# Patient Record
Sex: Male | Born: 1955
Health system: Southern US, Community
[De-identification: ages and names within clinical notes are randomized; demographics above are authoritative.]

## PROBLEM LIST (undated history)

## (undated) DIAGNOSIS — E669 Obesity, unspecified: Secondary | ICD-10-CM

## (undated) DIAGNOSIS — K5790 Diverticulosis of intestine, part unspecified, without perforation or abscess without bleeding: Secondary | ICD-10-CM

## (undated) DIAGNOSIS — K819 Cholecystitis, unspecified: Secondary | ICD-10-CM

## (undated) DIAGNOSIS — K219 Gastro-esophageal reflux disease without esophagitis: Secondary | ICD-10-CM

## (undated) DIAGNOSIS — E119 Type 2 diabetes mellitus without complications: Secondary | ICD-10-CM

## (undated) DIAGNOSIS — Z8489 Family history of other specified conditions: Secondary | ICD-10-CM

## (undated) DIAGNOSIS — R42 Dizziness and giddiness: Secondary | ICD-10-CM

## (undated) DIAGNOSIS — L570 Actinic keratosis: Secondary | ICD-10-CM

## (undated) DIAGNOSIS — N529 Male erectile dysfunction, unspecified: Secondary | ICD-10-CM

## (undated) DIAGNOSIS — J302 Other seasonal allergic rhinitis: Secondary | ICD-10-CM

## (undated) DIAGNOSIS — I10 Essential (primary) hypertension: Secondary | ICD-10-CM

## (undated) DIAGNOSIS — Z87442 Personal history of urinary calculi: Secondary | ICD-10-CM

## (undated) DIAGNOSIS — M1990 Unspecified osteoarthritis, unspecified site: Secondary | ICD-10-CM

## (undated) DIAGNOSIS — M791 Myalgia, unspecified site: Secondary | ICD-10-CM

## (undated) DIAGNOSIS — N486 Induration penis plastica: Secondary | ICD-10-CM

## (undated) DIAGNOSIS — E785 Hyperlipidemia, unspecified: Secondary | ICD-10-CM

## (undated) DIAGNOSIS — M722 Plantar fascial fibromatosis: Secondary | ICD-10-CM

## (undated) HISTORY — PX: LITHOTRIPSY: SUR834

## (undated) HISTORY — DX: Essential (primary) hypertension: I10

## (undated) HISTORY — DX: Gastro-esophageal reflux disease without esophagitis: K21.9

## (undated) HISTORY — DX: Hyperlipidemia, unspecified: E78.5

## (undated) HISTORY — DX: Obesity, unspecified: E66.9

## (undated) HISTORY — DX: Induration penis plastica: N48.6

## (undated) HISTORY — DX: Other seasonal allergic rhinitis: J30.2

## (undated) HISTORY — DX: Type 2 diabetes mellitus without complications: E11.9

## (undated) HISTORY — PX: HERNIA REPAIR: SHX51

## (undated) HISTORY — PX: ARTHROSCOPIC REPAIR ACL: SUR80

## (undated) HISTORY — DX: Cholecystitis, unspecified: K81.9

## (undated) HISTORY — DX: Plantar fascial fibromatosis: M72.2

## (undated) HISTORY — DX: Male erectile dysfunction, unspecified: N52.9

## (undated) HISTORY — PX: MENISCUS REPAIR: SHX5179

## (undated) HISTORY — DX: Myalgia, unspecified site: M79.10

## (undated) HISTORY — DX: Diverticulosis of intestine, part unspecified, without perforation or abscess without bleeding: K57.90

## (undated) HISTORY — DX: Actinic keratosis: L57.0

---

## 2004-10-29 ENCOUNTER — Ambulatory Visit: Payer: Self-pay | Admitting: Unknown Physician Specialty

## 2007-11-03 ENCOUNTER — Ambulatory Visit: Payer: Self-pay | Admitting: Family Medicine

## 2007-11-03 IMAGING — US ABDOMEN ULTRASOUND
1 series · 17 of 25 positions shown · non-contrast
Comparison: none

REASON FOR EXAM: abd pain eval gallbladder
COMMENTS:

[Series 1: abdomen ultrasound · 17 of 64 slices shown]
[im 1/64]
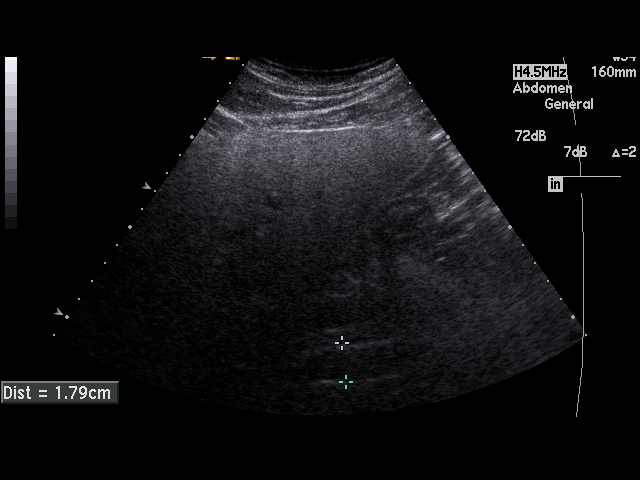
[im 6/64]
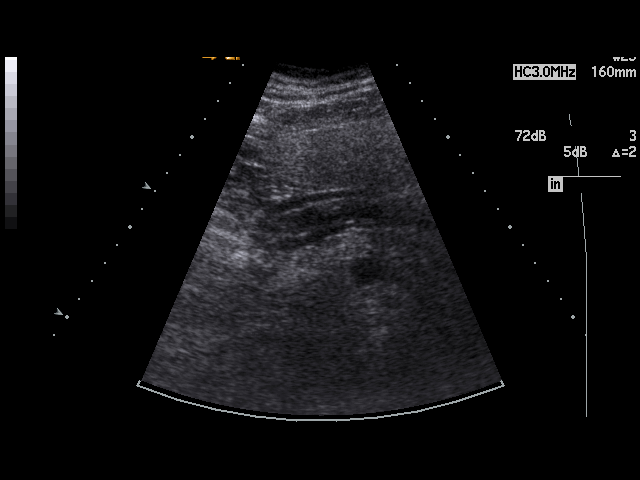
[im 8/64]
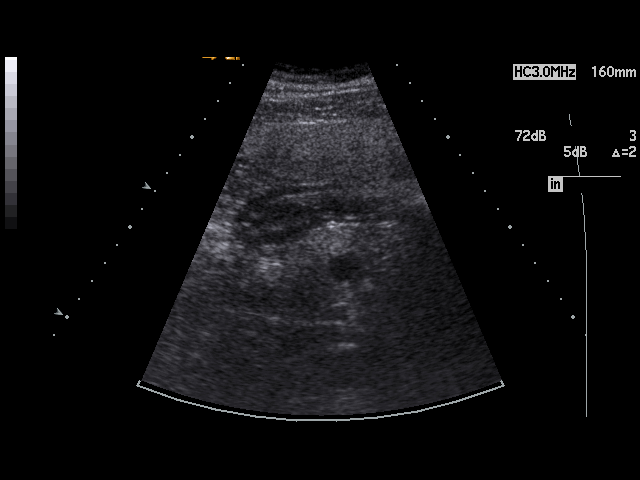
[im 14/64]
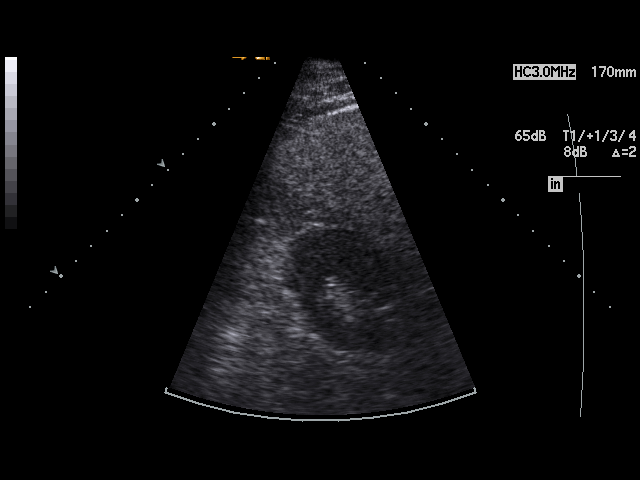
[im 16/64]
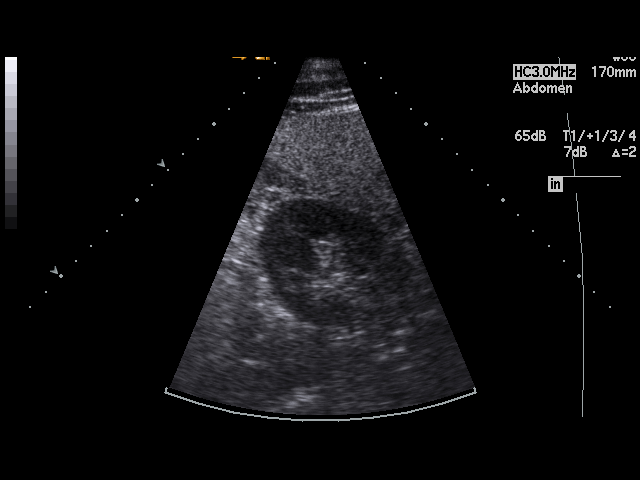
[im 22/64]
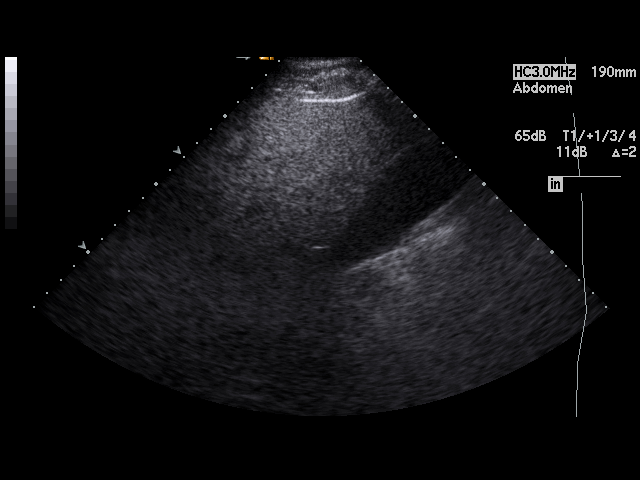
[im 24/64]
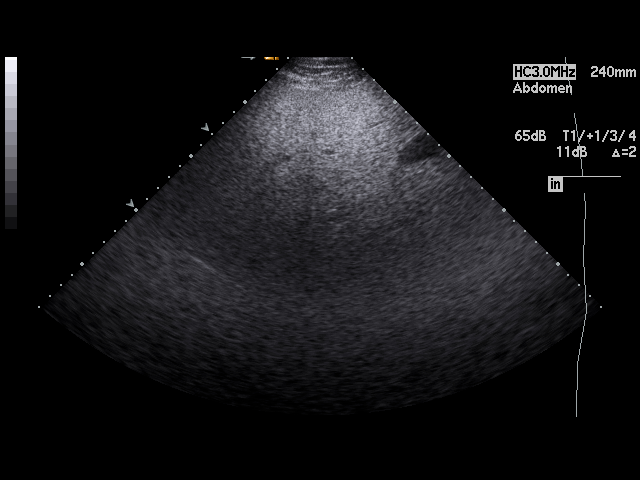
[im 29/64]
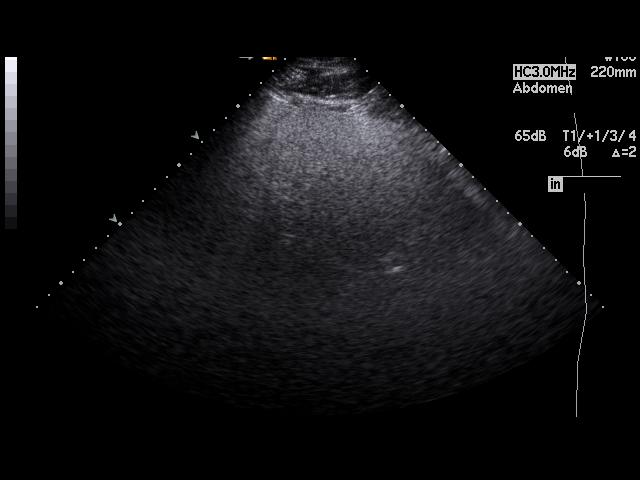
[im 32/64]
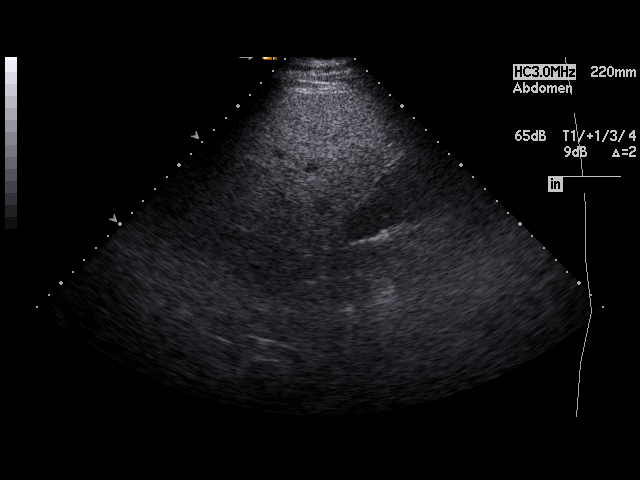
[im 35/64]
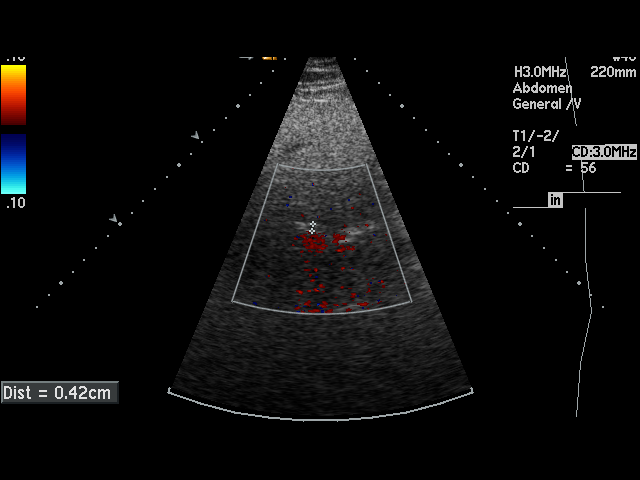
[im 40/64]
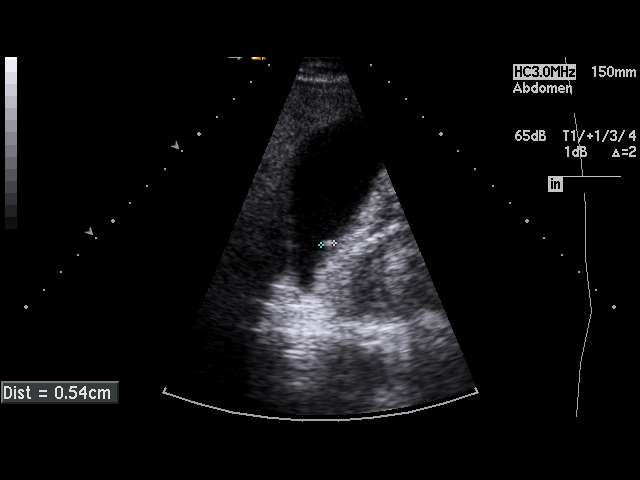
[im 43/64]
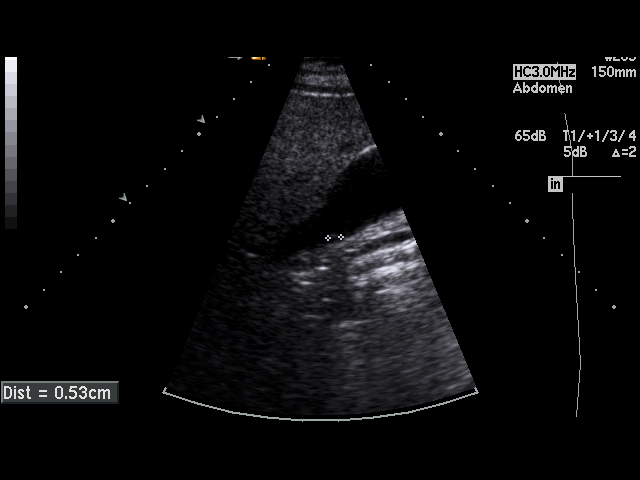
[im 48/64]
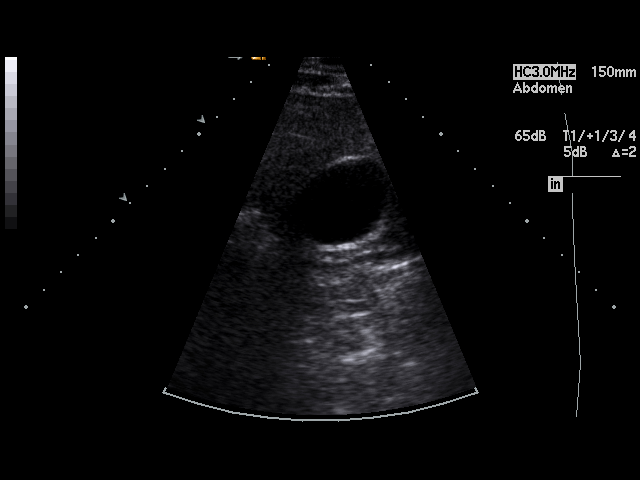
[im 50/64]
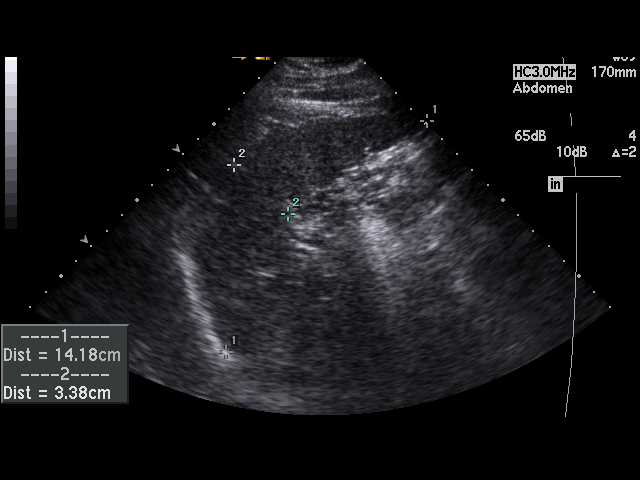
[im 56/64]
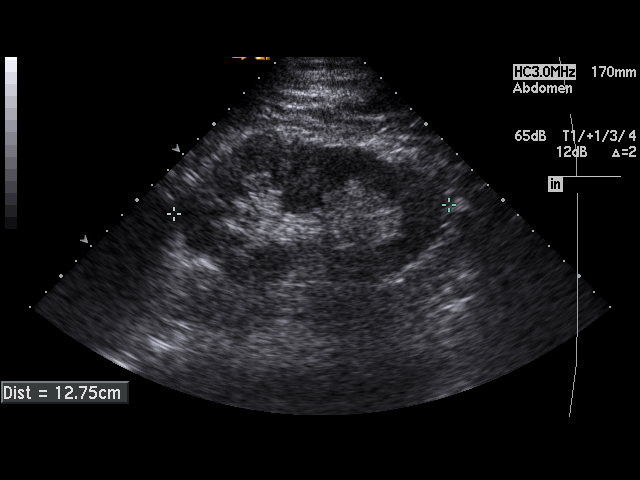
[im 58/64]
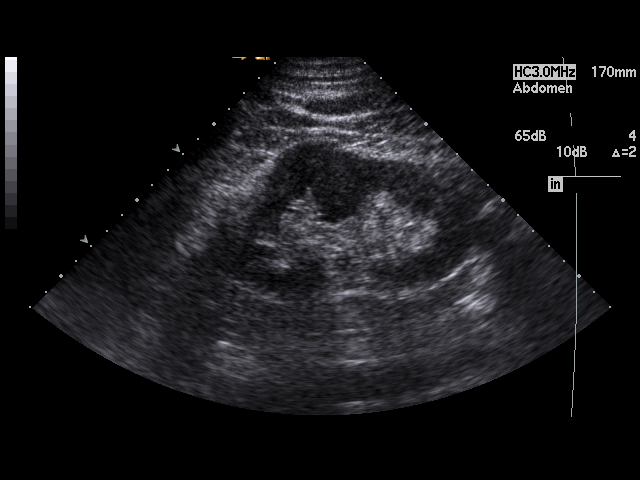
[im 64/64]
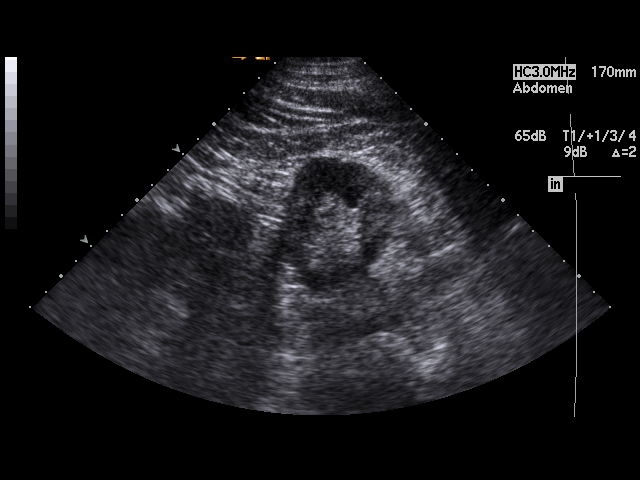

[17 of 25 positions shown; findings below may reference images not displayed]

PROCEDURE:     US  - US ABDOMEN GENERAL SURVEY  - [DATE]  [DATE]

RESULT:     The liver is dense, suspicious for fatty infiltration. The
spleen is mildly enlarged. The spleen measured 14.1 cm in AP diameter. The
abdominal aorta and inferior vena cava show no significant abnormalities.
The head and body of the pancreas are normal in appearance. The pancreatic
tail is obscured. There is a nonmobile nonshadowing 5.4 mm echodensity
associated with the posterior wall of the gallbladder and consistent with a
polyp. No definite gallstones are seen. There is no thickening of the
gallbladder wall. The common bile duct measures 4.2 mm in diameter which is
within normal limits. The kidneys show no hydronephrosis. There is no
ascites.
IMPRESSION: 1. Probable fatty infiltration of the liver.
2. There is a nonshadowing, nonmobile echodensity in the gallbladder
consistent with a polyp.
3. The spleen appears mildly enlarged.
4. A portion of the pancreatic tail is obscured.

## 2007-11-13 ENCOUNTER — Ambulatory Visit: Payer: Self-pay | Admitting: Surgery

## 2007-11-13 IMAGING — NM NUCLEAR MEDICINE HEPATOHBILIARY INCLUDE GB
3 series · 19 of 19 positions shown · non-contrast
Comparison: none

REASON FOR EXAM: polyps gallbladder    RUQ pain
COMMENTS:

[Series 1000: gallbladder statics · 4.80mm/px · 7 of 7 slices shown]
[im 1/7]
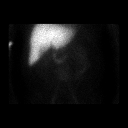
[im 2/7]
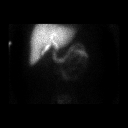
[im 3/7]
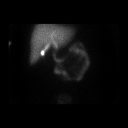
[im 4/7]
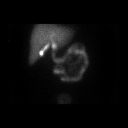
[im 5/7]
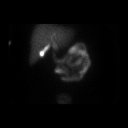
[im 6/7]
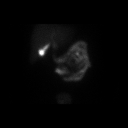
[im 7/7]
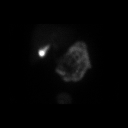

[Series 1000: gallbladder dynamic (results) · 4.80mm/px · 6 of 60 frames shown]
[frame 6/60]
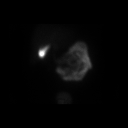
[frame 16/60]
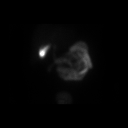
[frame 26/60]
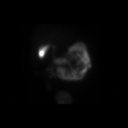
[frame 36/60]
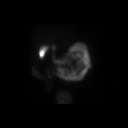
[frame 46/60]
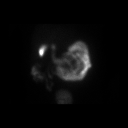
[frame 56/60]
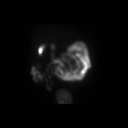

[Series 1000: gallbladder dynamic · 4.80mm/px · 6 of 60 frames shown]
[frame 6/60]
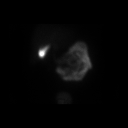
[frame 16/60]
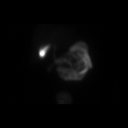
[frame 26/60]
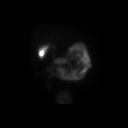
[frame 36/60]
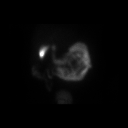
[frame 46/60]
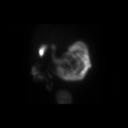
[frame 56/60]
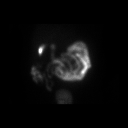

[19 of 19 positions shown; findings below may reference images not displayed]

PROCEDURE:     NM  - NM HEPATO WITH GB EJECT FRACTION  - [DATE] [DATE]

RESULT:     Following oral administration of 7.8 mCi technetium 99m
Choletec, there is observed prompt visualization of tracer activity in the
liver at 3 minutes. At 10 minutes tracer activity is visualized in the
gallbladder, common duct and proximal small bowel.

The gallbladder ejection fraction at 30 minutes measure 73%, which is in the
normal range.
IMPRESSION: 1. Normal hepatobiliary scan.
2. The gallbladder ejection fraction measures 73%.

## 2007-12-31 ENCOUNTER — Other Ambulatory Visit: Payer: Self-pay

## 2007-12-31 ENCOUNTER — Emergency Department: Payer: Self-pay | Admitting: Emergency Medicine

## 2010-10-05 ENCOUNTER — Ambulatory Visit: Payer: Self-pay | Admitting: Unknown Physician Specialty

## 2010-10-09 LAB — PATHOLOGY REPORT

## 2010-11-20 ENCOUNTER — Ambulatory Visit: Payer: Self-pay | Admitting: Family Medicine

## 2010-11-20 IMAGING — CR DG CHEST 2V
1 series · 2 of 2 positions shown · non-contrast
Comparison: none

REASON FOR EXAM: fever of unknow origin fatigue
COMMENTS:

PROCEDURE:     DXR - DXR CHEST PA (OR AP) AND LATERAL  - [DATE]  [DATE]
RESULT:     The lungs are clear. The cardiac silhouette and visualized bony
skeleton are unremarkable.

[Series 1: view not recorded · 0.17mm/px · 2 of 2 slices shown]
[im 1/2]
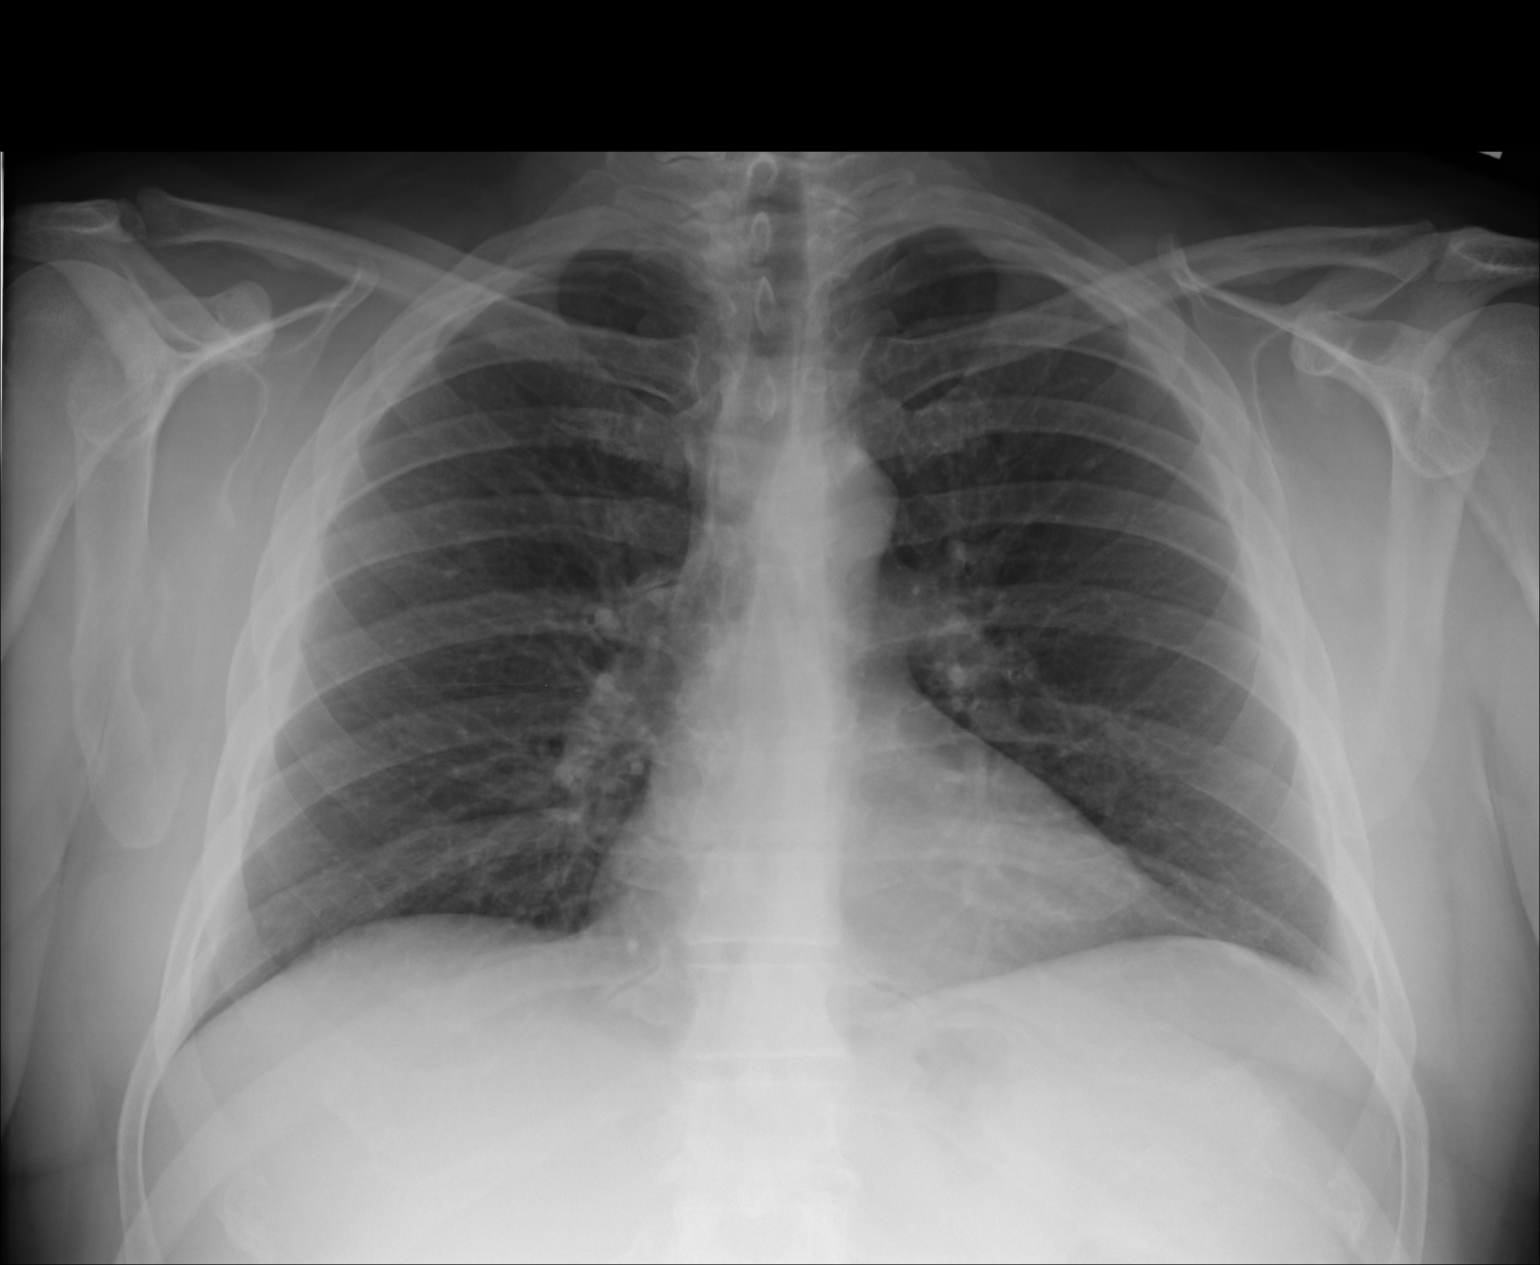
[im 2/2]
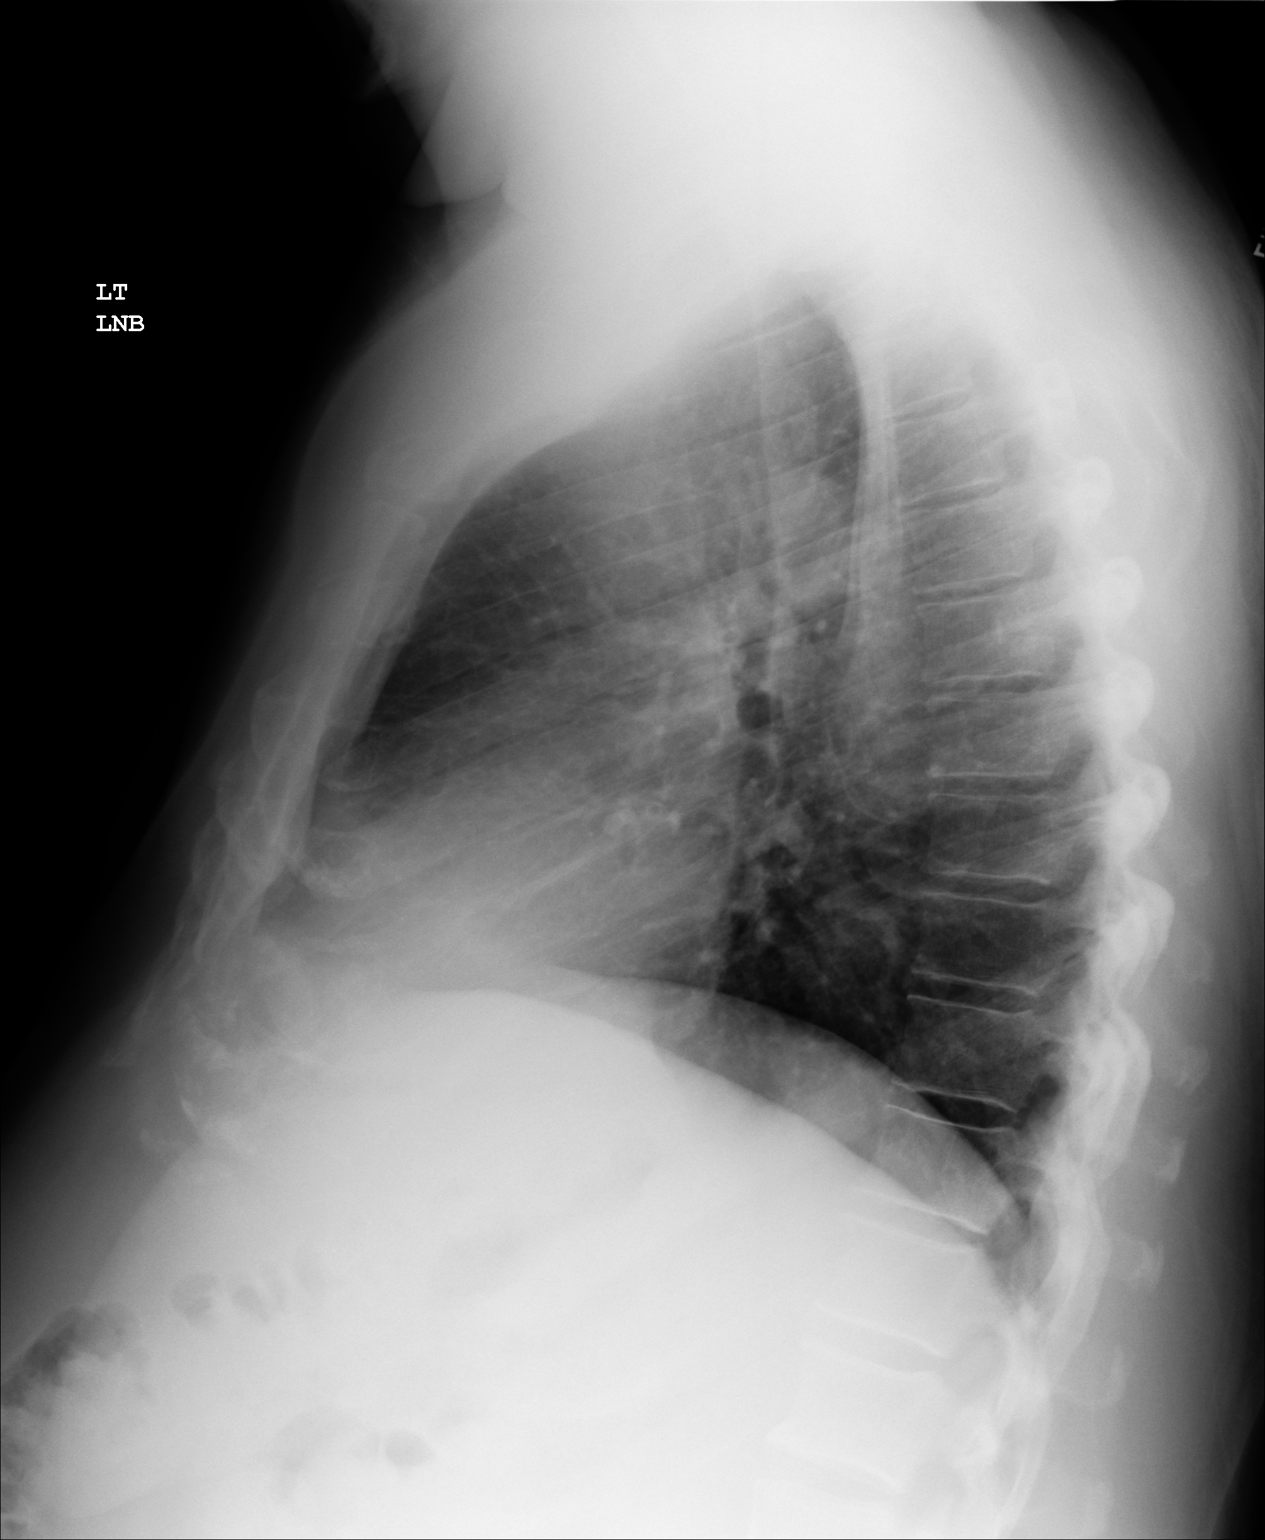

[2 of 2 positions shown; findings below may reference images not displayed]

IMPRESSION: 1. Chest radiograph without evidence of acute cardiopulmonary disease.

## 2012-11-16 ENCOUNTER — Ambulatory Visit: Payer: Self-pay | Admitting: Family Medicine

## 2012-11-16 IMAGING — CR DG CHEST 2V
1 series · 3 of 3 positions shown · non-contrast
Comparison: none

REASON FOR EXAM: CHEST PAIN
COMMENTS:

PROCEDURE:     KDR - KDXR CHEST PA (OR AP) AND LAT  - [DATE] [DATE]
RESULT:     Comparison: [DATE]

[Series 1: pa · 0.17mm/px · 3 of 3 slices shown]
[im 1/3]
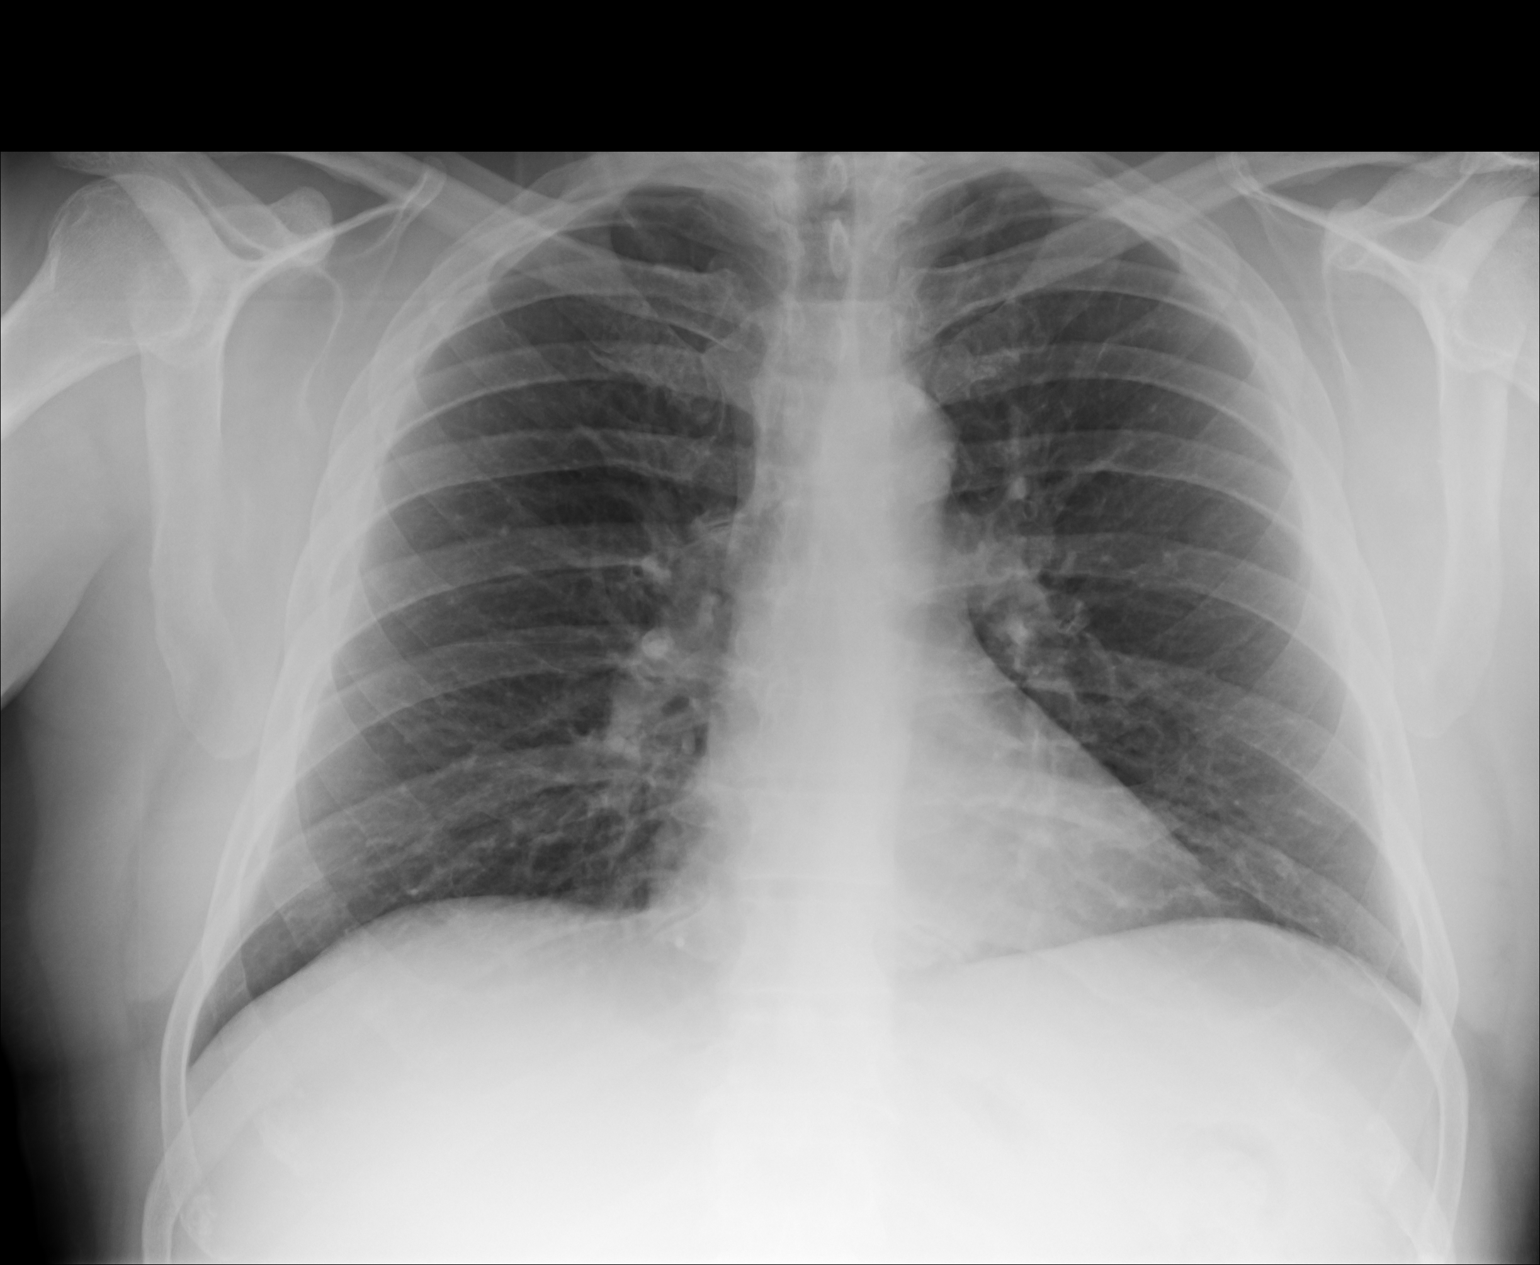
[im 2/3]
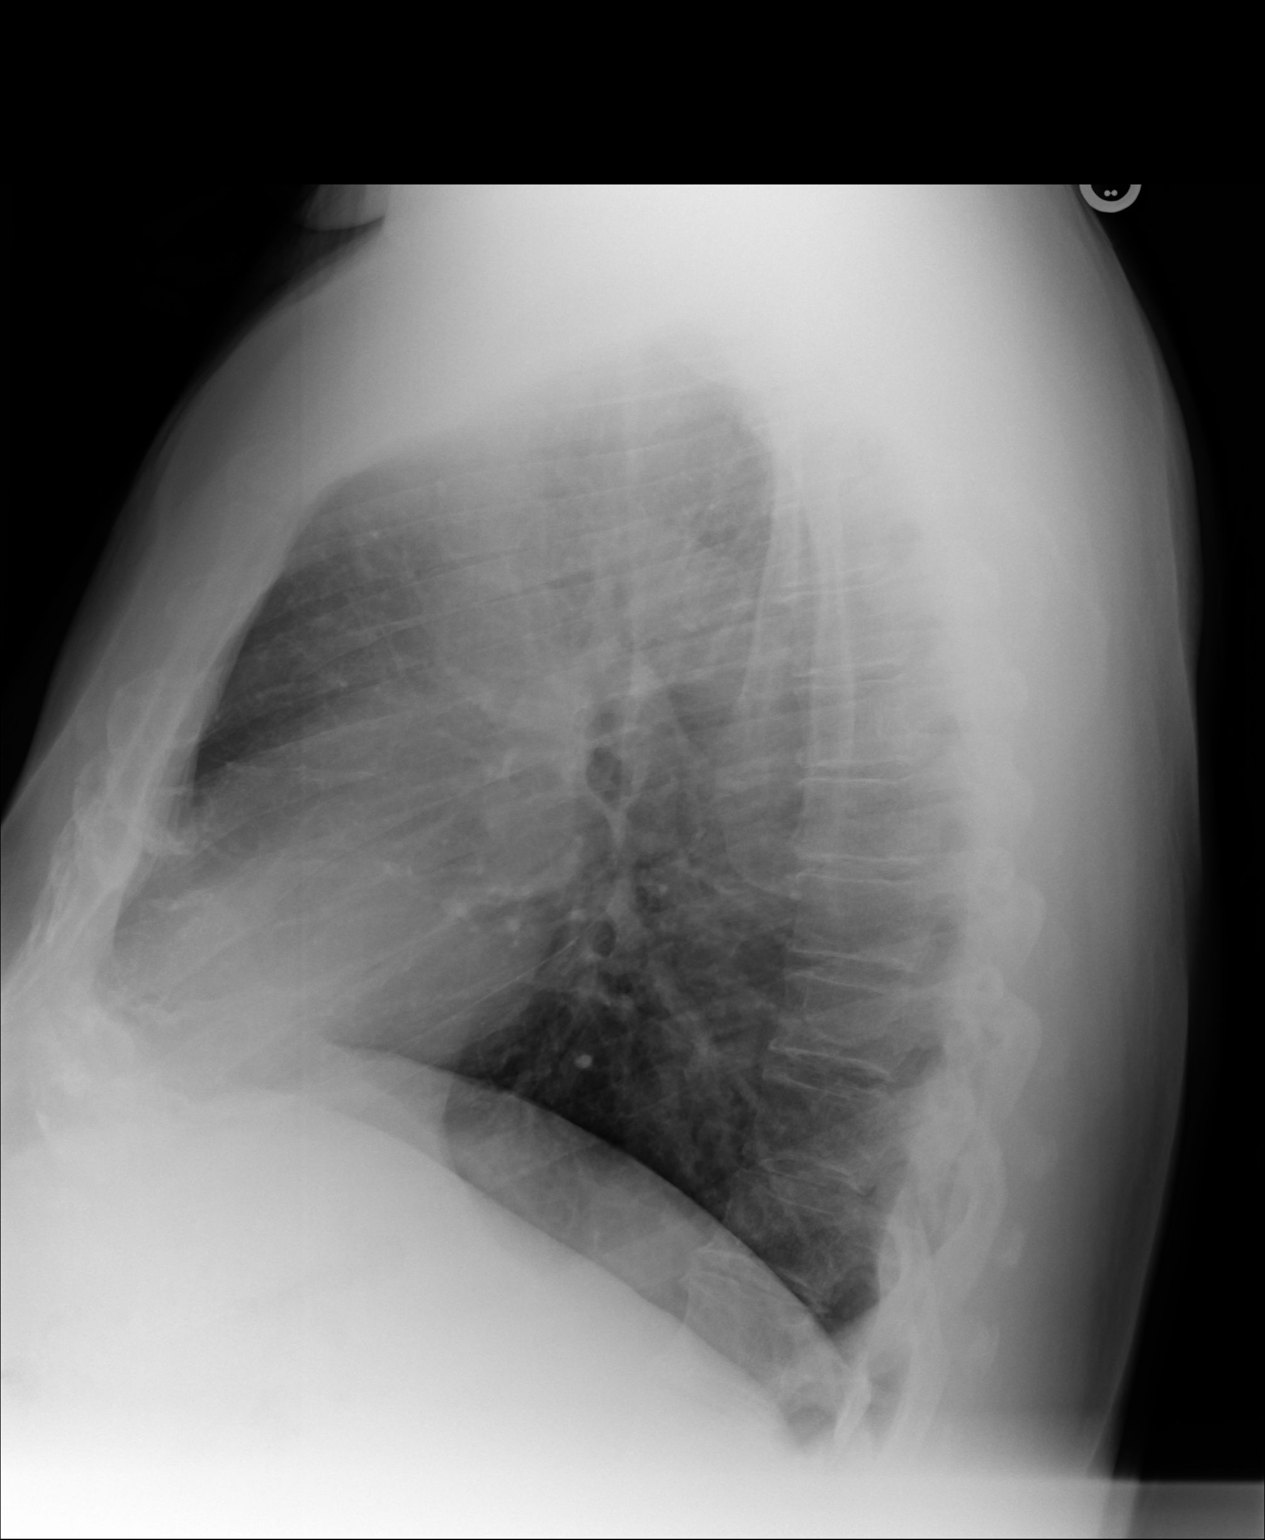
[im 3/3]
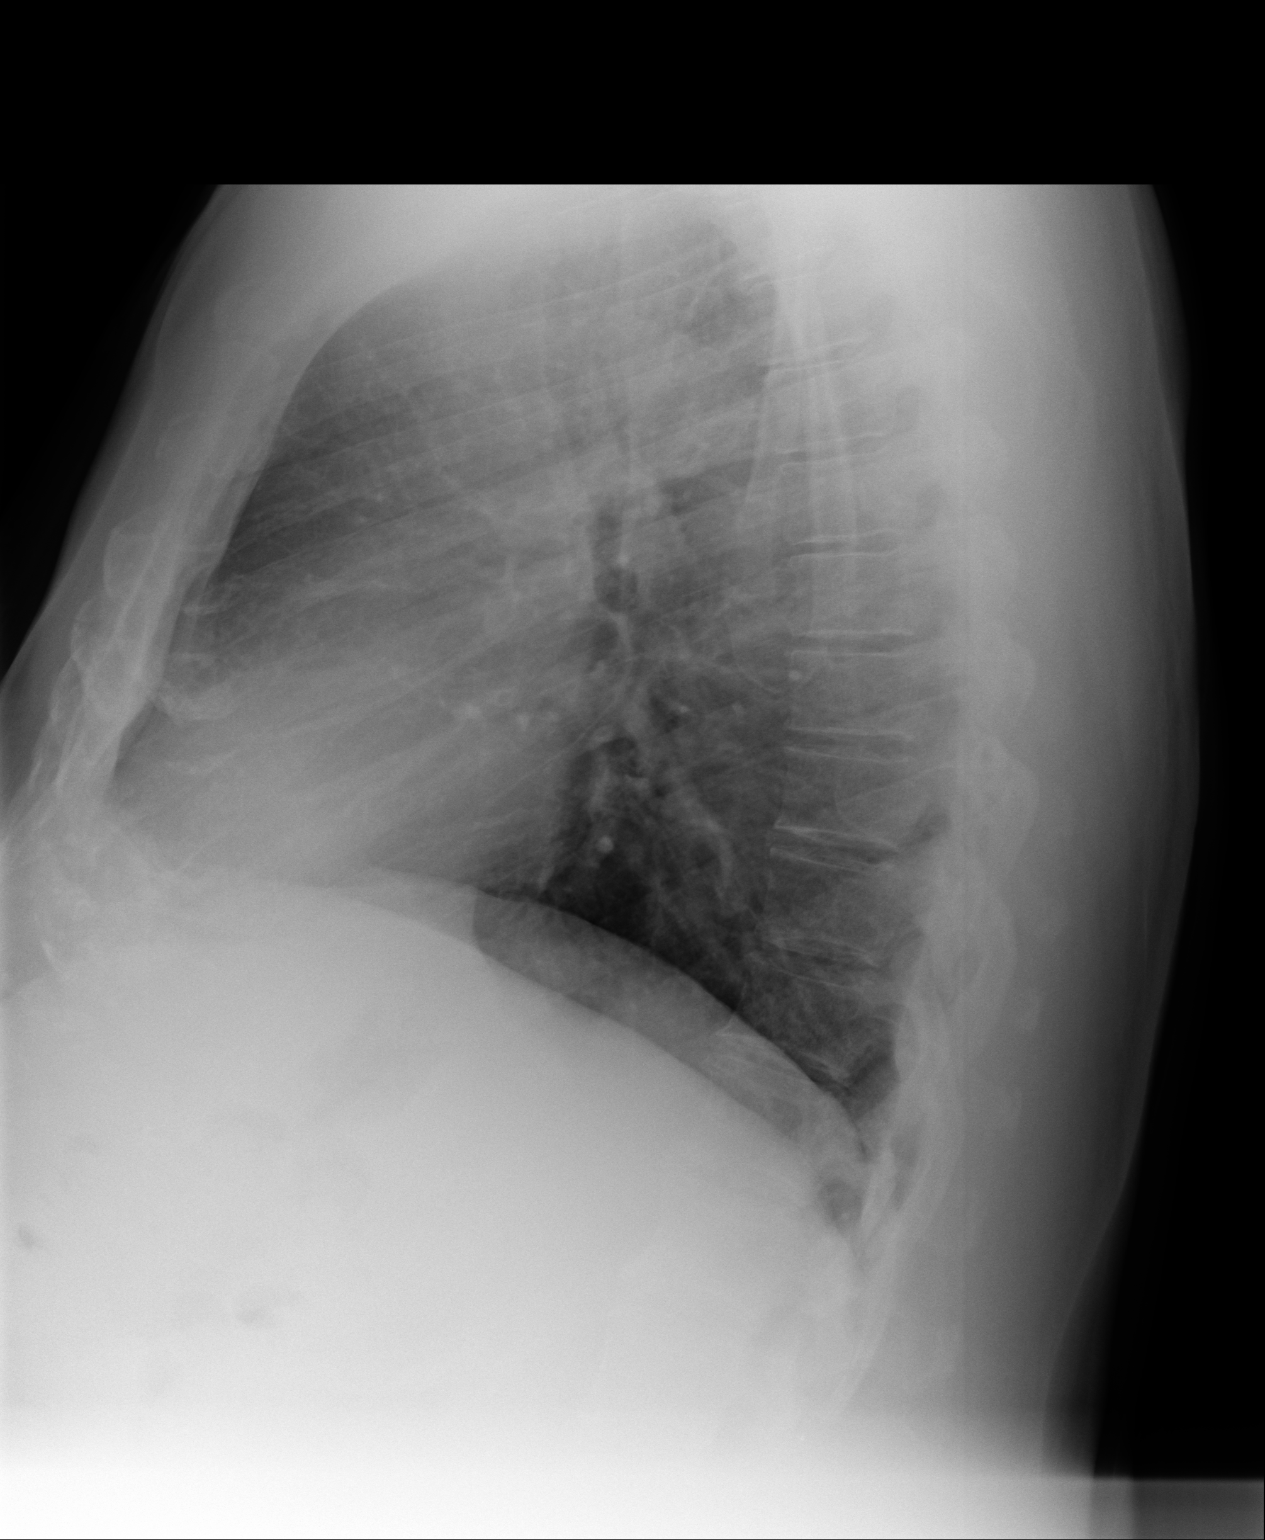

[3 of 3 positions shown; findings below may reference images not displayed]

FINDINGS: The heart and mediastinum are stable. The lung volumes are slightly
diminished. No focal pulmonary opacities.
IMPRESSION: No acute cardiopulmonary disease.

[REDACTED]

## 2013-10-28 ENCOUNTER — Ambulatory Visit: Payer: Self-pay | Admitting: Family Medicine

## 2013-10-28 IMAGING — CR RIGHT HIP - COMPLETE 2+ VIEW
1 series · 3 of 3 positions shown · non-contrast
Comparison: None.

CLINICAL DATA: Right hip pain.

EXAM:
RIGHT HIP - COMPLETE 2+ VIEW

[Series 1: t hip ap right · 0.14mm/px · 3 of 3 slices shown]
[im 1/3]
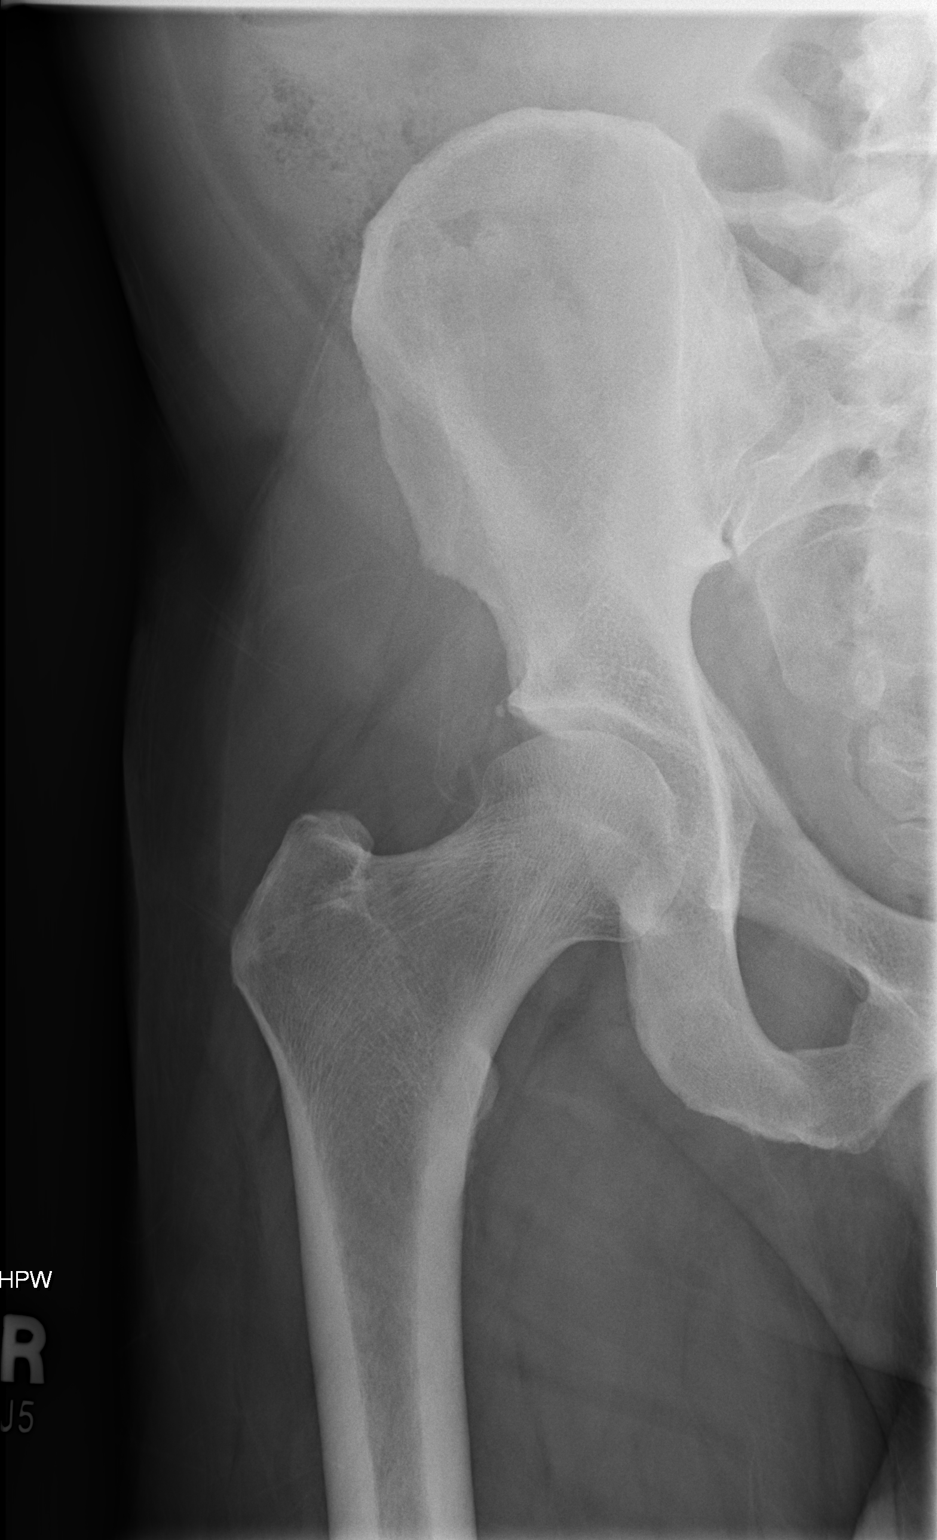
[im 2/3]
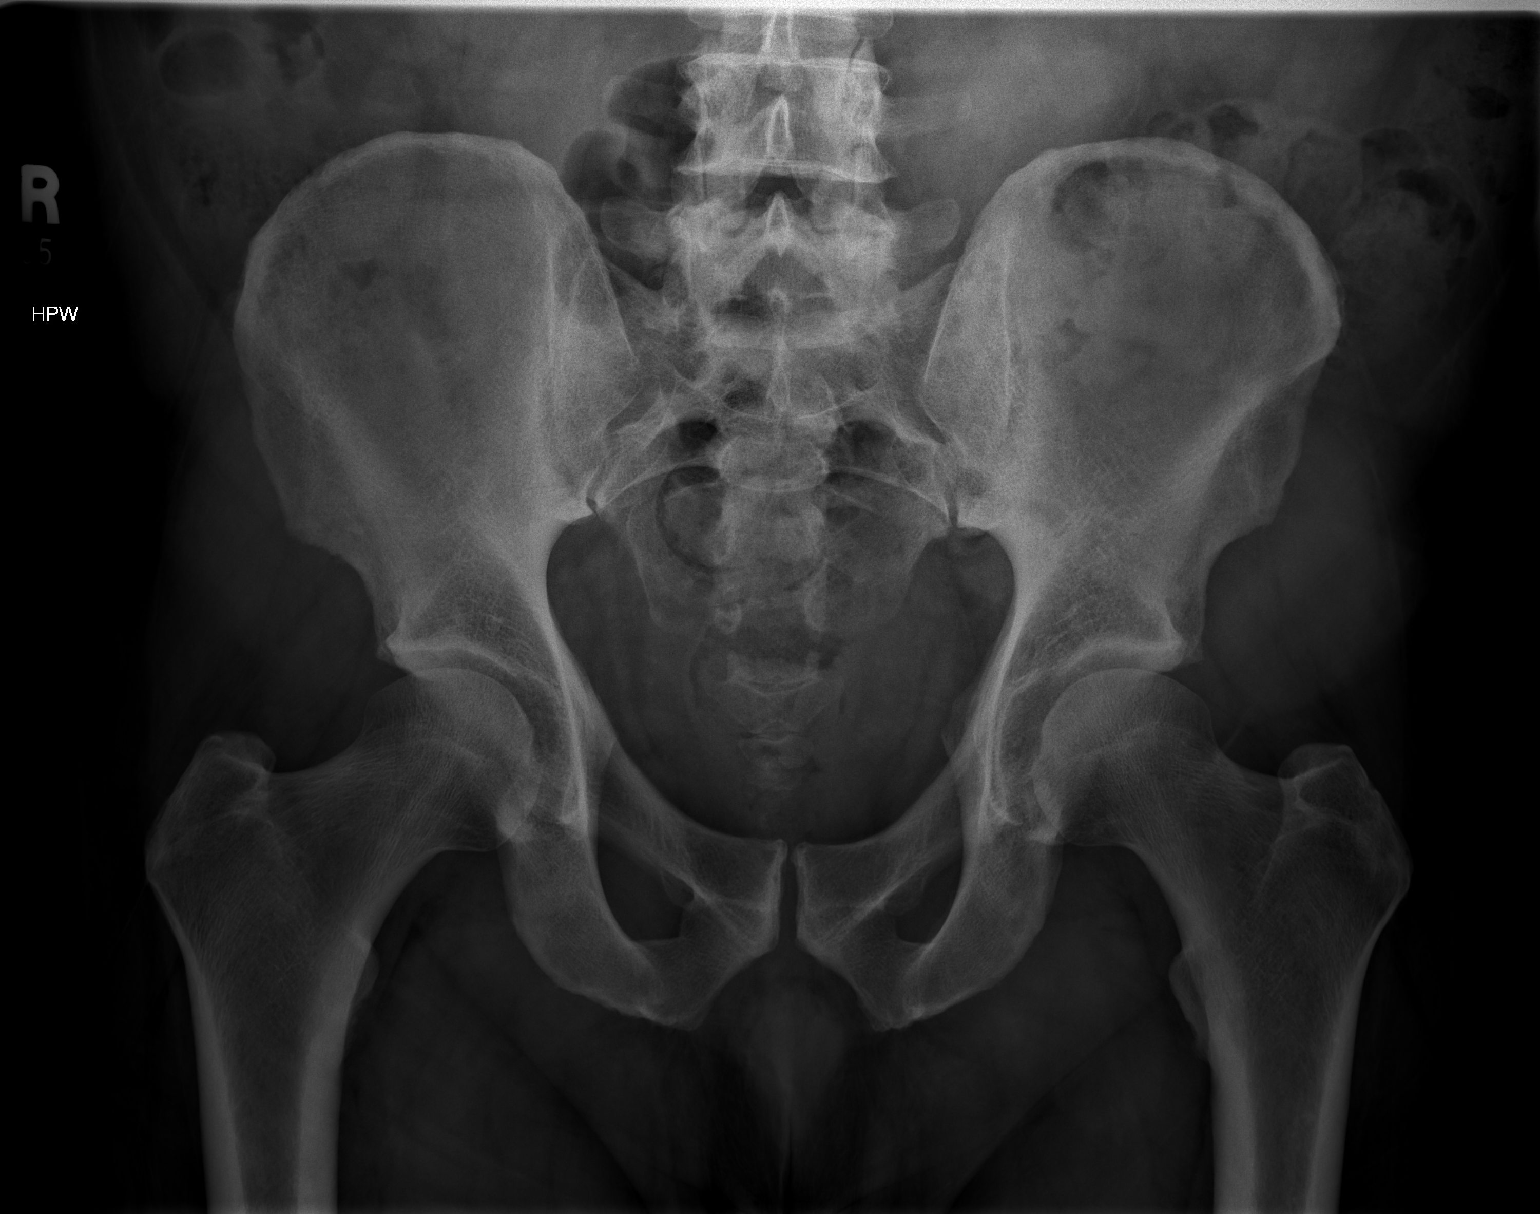
[im 3/3]
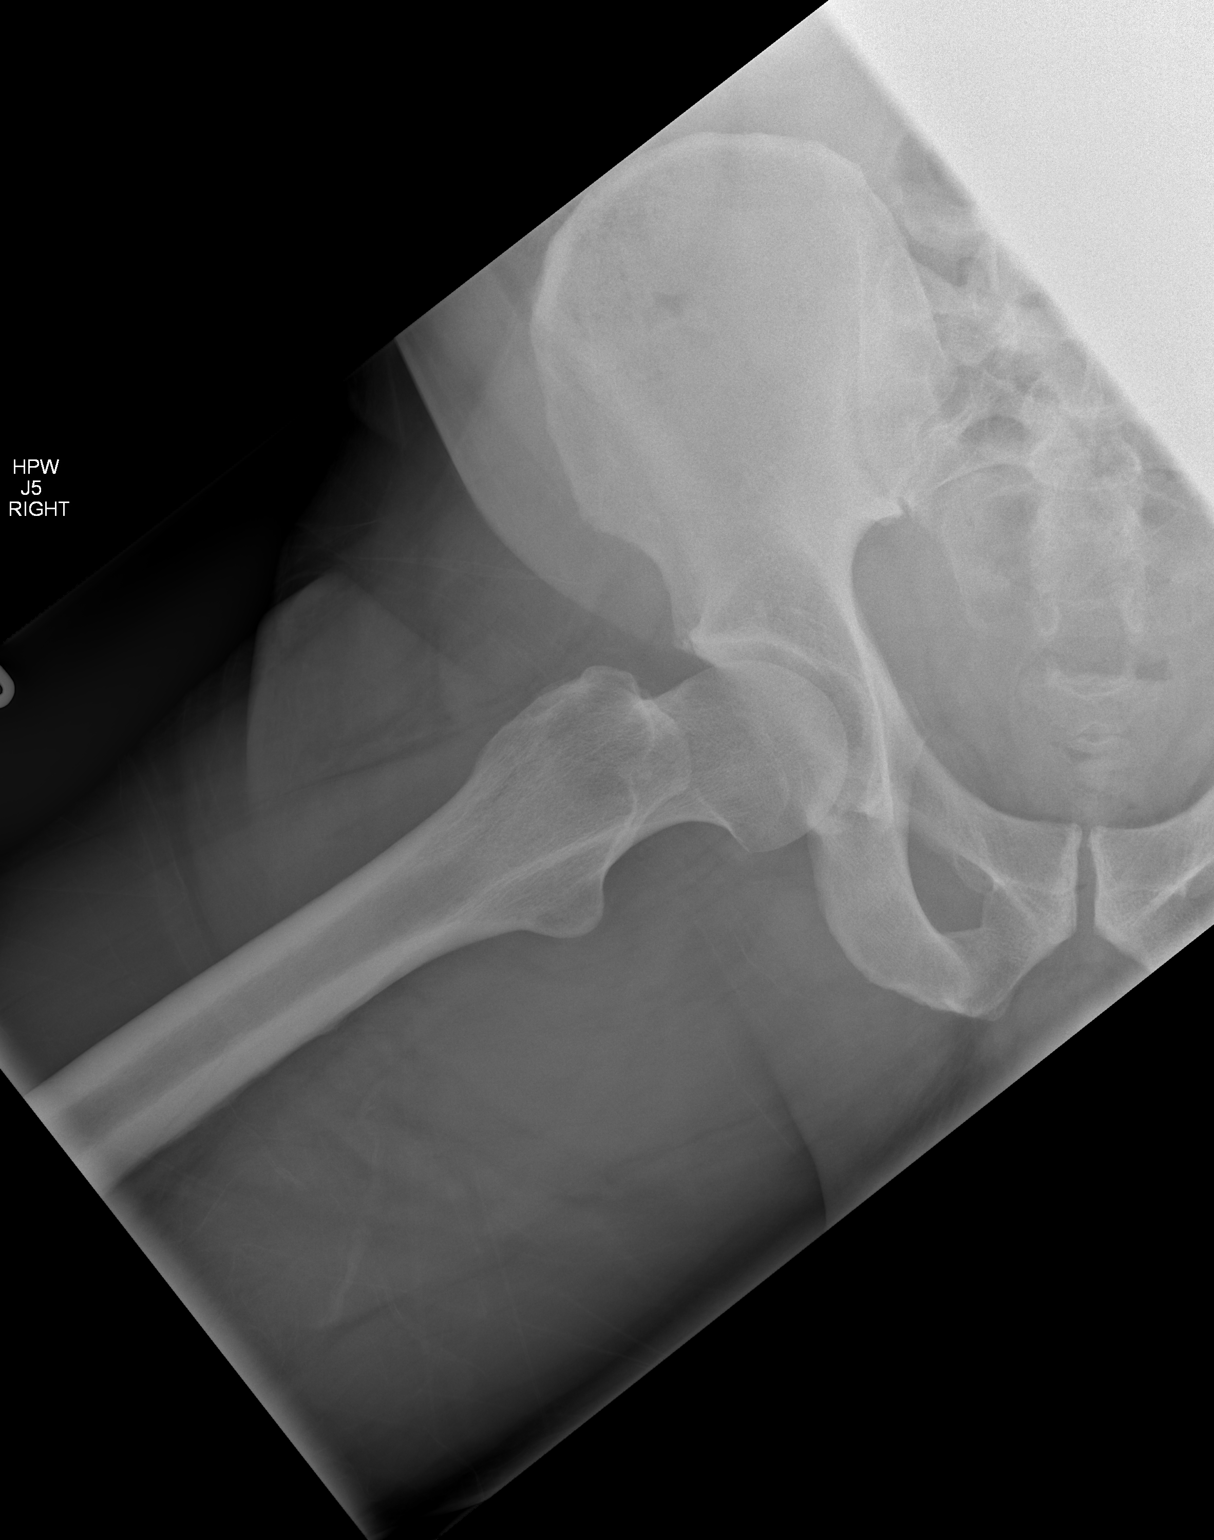

[3 of 3 positions shown; findings below may reference images not displayed]

FINDINGS: There is no evidence of hip fracture or dislocation. There is no
evidence of arthropathy or other focal bone abnormality.
IMPRESSION: Normal right hip.

## 2013-10-28 IMAGING — US US EXTREM LOW VENOUS*R*
1 series · 14 of 24 positions shown · non-contrast
Comparison: None.

CLINICAL DATA: Right lower extremity pain and edema

EXAM:
RIGHT LOWER EXTREMITY VENOUS DUPLEX ULTRASOUND
TECHNIQUE: Gray-scale sonography with graded compression, as well as color
Doppler and duplex ultrasound, were performed to evaluate the deep
venous system from the level of the common femoral vein through the
popliteal and proximal calf veins. Spectral Doppler was utilized to
evaluate flow at rest and with distal augmentation maneuvers.

[Series 1: us extrem low venous*right* · 0.11mm/px · 14 of 36 slices shown]
[im 1/36]
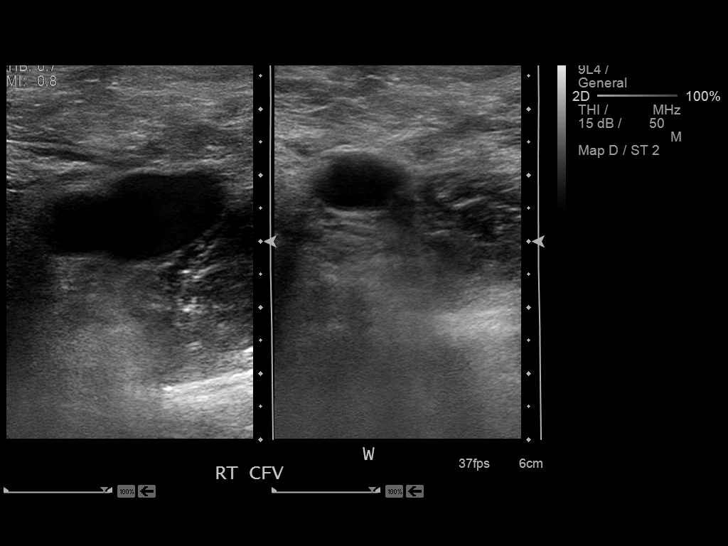
[im 4/36]
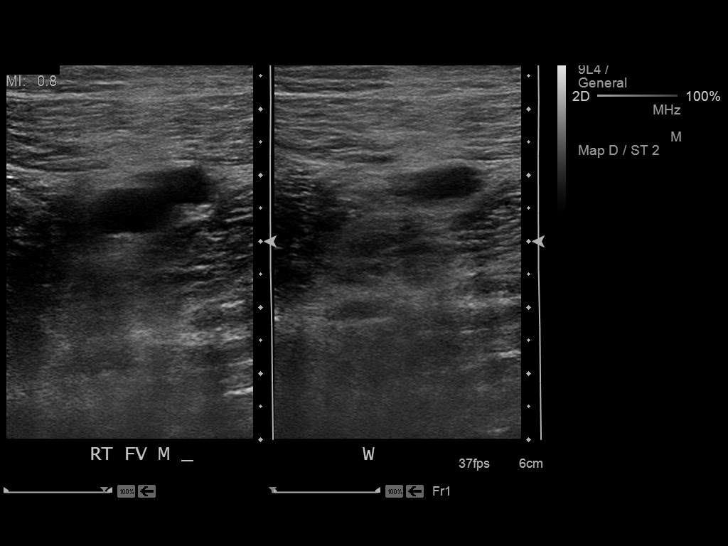
[im 7/36]
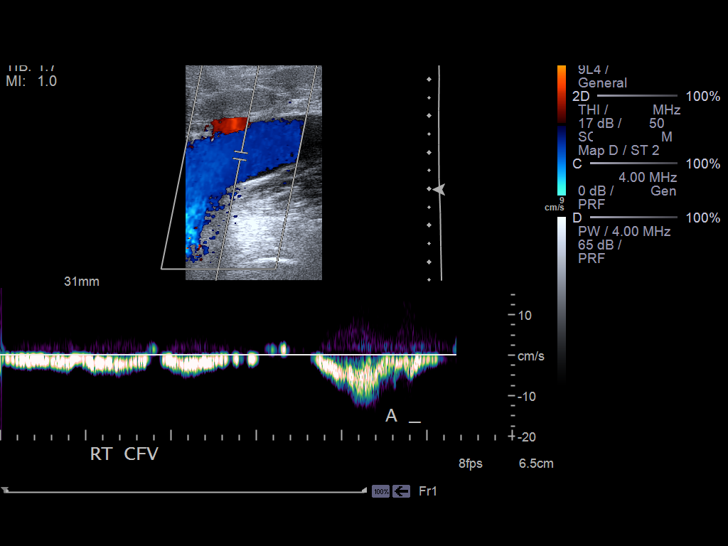
[im 10/36]
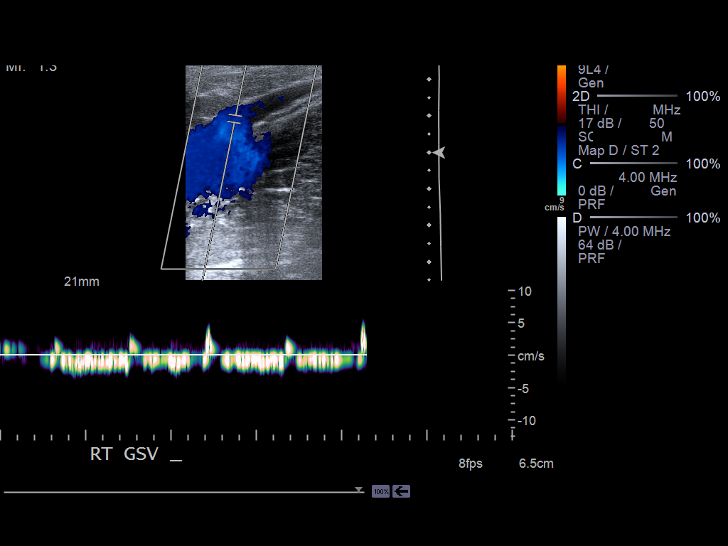
[im 11/36]
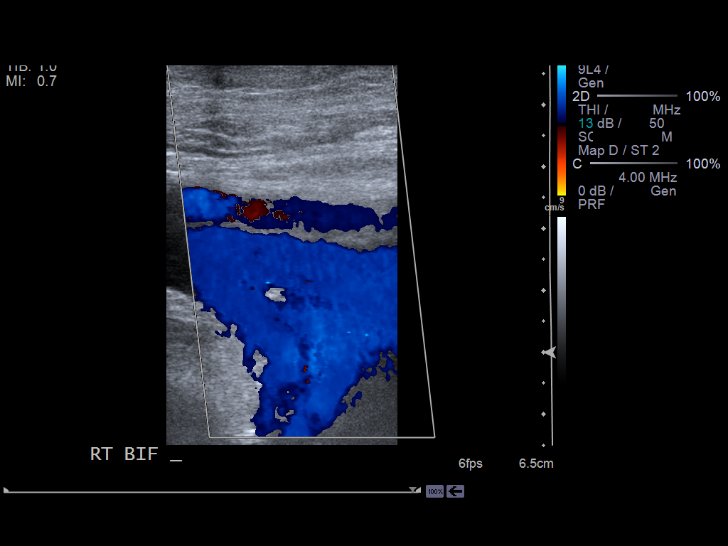
[im 14/36]
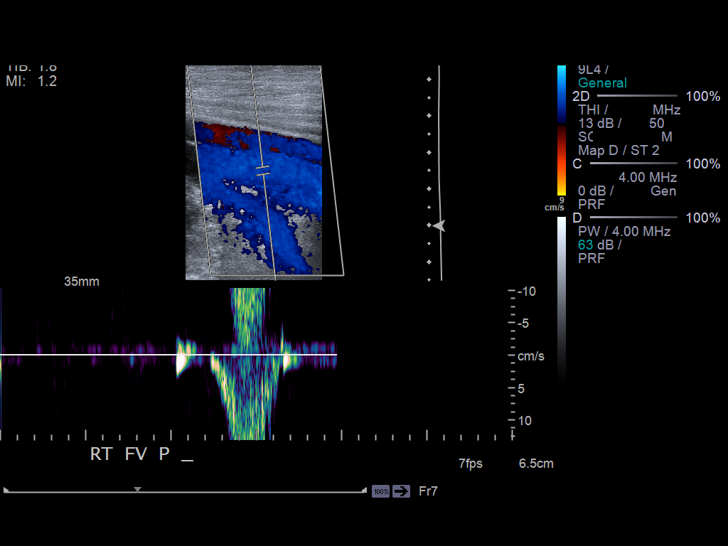
[im 17/36]
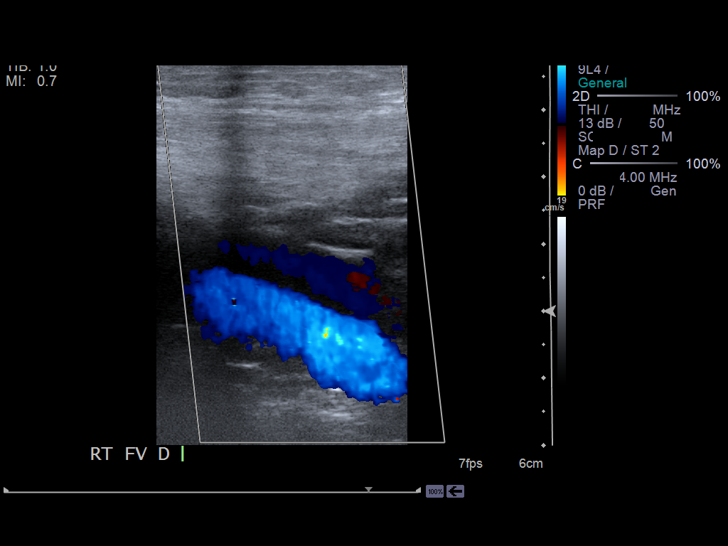
[im 19/36]
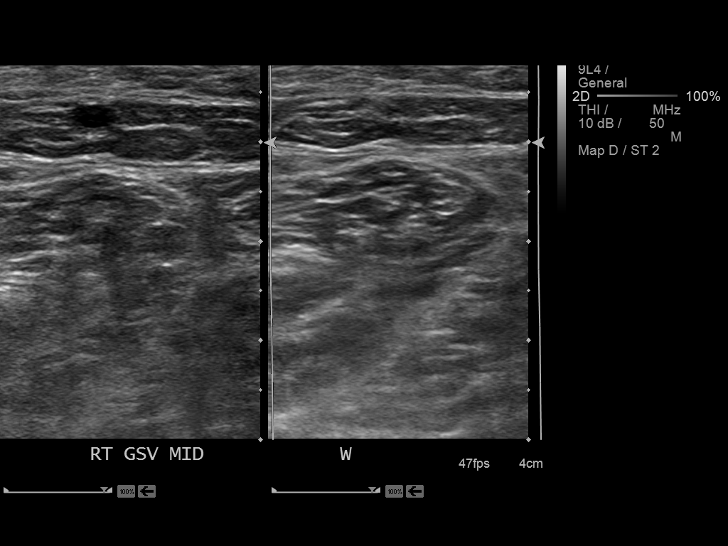
[im 22/36]
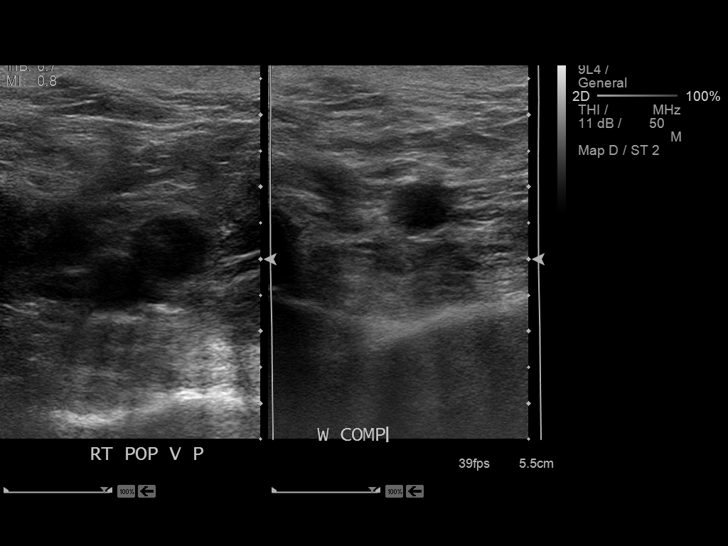
[im 25/36]
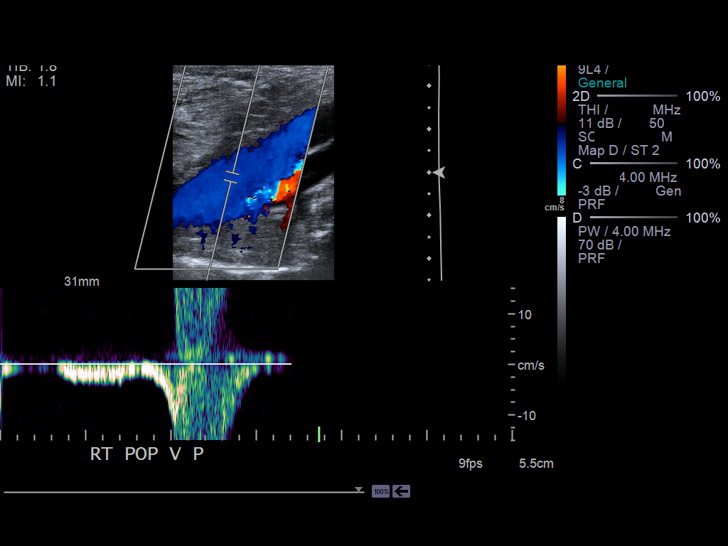
[im 28/36]
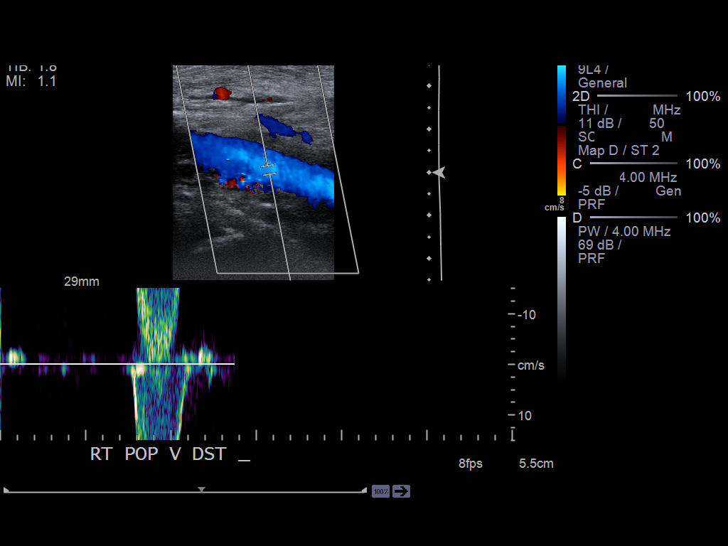
[im 29/36]
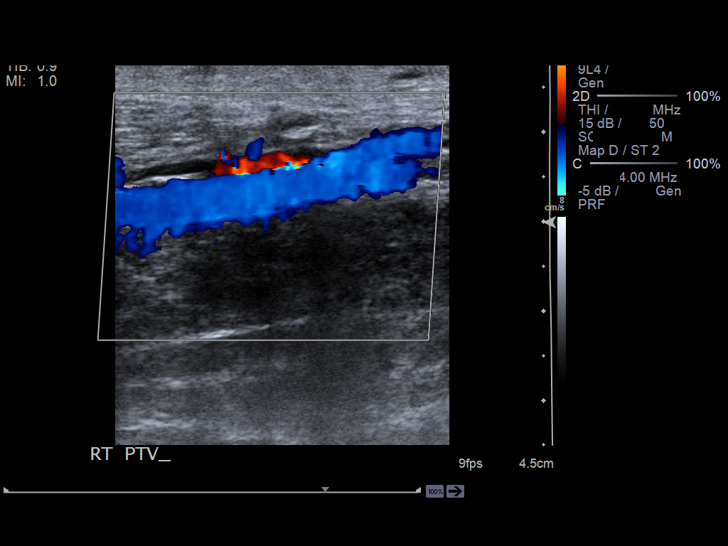
[im 32/36]
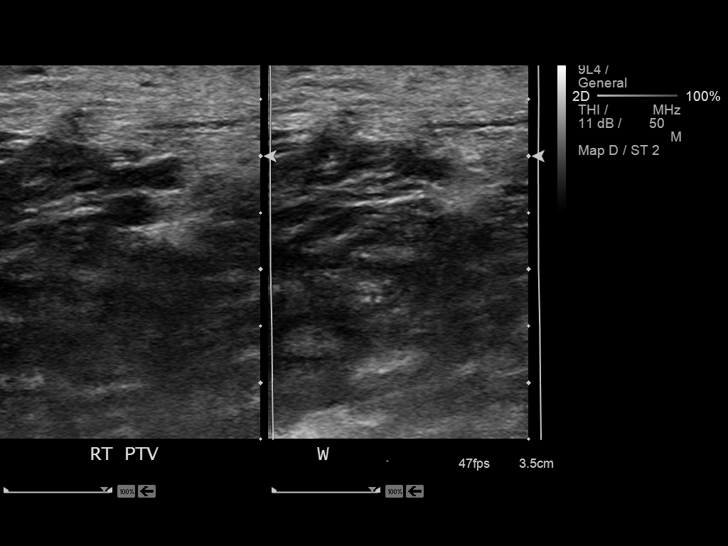
[im 36/36]
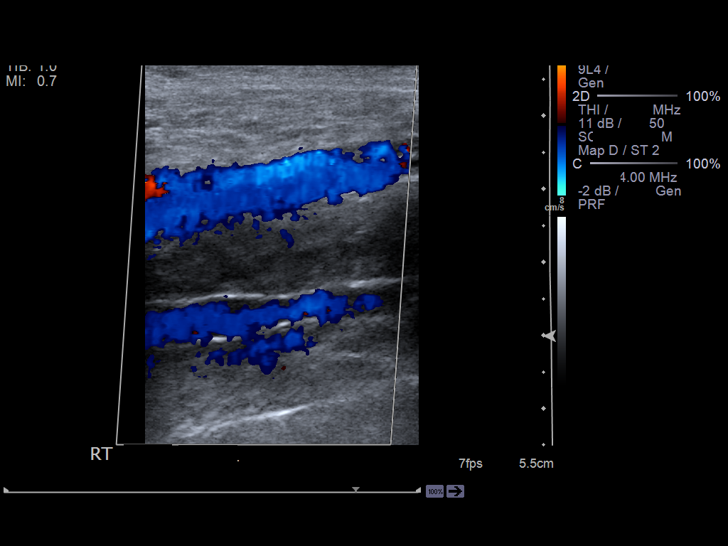

[14 of 24 positions shown; findings below may reference images not displayed]

FINDINGS: Flow in the venous structures of the right lower extremity is
spontaneous and phasic in all segments. There is normal compression
and augmentation in the venous structures of the right lower
extremity. Venous Doppler signal is normal in all regions. There is
no thrombus in the deep or visualized superficial venous structures
on the right. There is no right lower extremity deep venous
incompetence.
IMPRESSION: No evidence of right lower extremity deep venous thrombosis.

## 2014-04-22 LAB — HEMOGLOBIN A1C: Hgb A1c MFr Bld: 8.5 % — AB (ref 4.0–6.0)

## 2014-07-06 ENCOUNTER — Ambulatory Visit: Payer: Self-pay | Admitting: Family Medicine

## 2014-07-06 LAB — DOT URINE DIP
Blood: NEGATIVE
Glucose,UR: >=1000
Protein: NEGATIVE
Specific Gravity: 1.01 (ref 1.000–1.030)

## 2014-08-22 LAB — BASIC METABOLIC PANEL
BUN: 17 mg/dL (ref 4–21)
Creatinine: 1.1 mg/dL (ref 0.6–1.3)
Glucose: 121 mg/dL
Potassium: 4 mmol/L (ref 3.4–5.3)
Sodium: 136 mmol/L — AB (ref 137–147)

## 2014-08-22 LAB — CBC AND DIFFERENTIAL
HCT: 48 % (ref 41–53)
Hemoglobin: 16.6 g/dL (ref 13.5–17.5)
Neutrophils Absolute: 49 /uL
Platelets: 243 10*3/uL (ref 150–399)
WBC: 9.1 10^3/mL

## 2014-08-22 LAB — HEPATIC FUNCTION PANEL
ALT: 19 U/L (ref 10–40)
AST: 18 U/L (ref 14–40)
Alkaline Phosphatase: 58 U/L (ref 25–125)
Bilirubin, Total: 0.3 mg/dL

## 2014-08-22 LAB — PSA: PSA: 1.1

## 2014-08-22 LAB — FECAL OCCULT BLOOD, GUAIAC: Fecal Occult Blood: NEGATIVE

## 2014-08-22 LAB — LIPID PANEL
Cholesterol: 240 mg/dL — AB (ref 0–200)
HDL: 24 mg/dL — AB (ref 35–70)
LDL Cholesterol: 0 mg/dL
LDl/HDL Ratio: 0
Triglycerides: 847 mg/dL — AB (ref 40–160)

## 2014-08-22 LAB — HM COLONOSCOPY: HM Colonoscopy: 2242012

## 2014-11-30 ENCOUNTER — Ambulatory Visit
Admit: 2014-11-30 | Disposition: A | Discharge status: Home / Self Care | Payer: Self-pay | Attending: Family Medicine | Admitting: Family Medicine
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
## 2014-12-06 DIAGNOSIS — E785 Hyperlipidemia, unspecified: Secondary | ICD-10-CM

## 2014-12-06 DIAGNOSIS — J302 Other seasonal allergic rhinitis: Secondary | ICD-10-CM

## 2014-12-06 DIAGNOSIS — E782 Mixed hyperlipidemia: Secondary | ICD-10-CM | POA: Insufficient documentation

## 2014-12-06 DIAGNOSIS — E1159 Type 2 diabetes mellitus with other circulatory complications: Secondary | ICD-10-CM | POA: Insufficient documentation

## 2014-12-06 DIAGNOSIS — N486 Induration penis plastica: Secondary | ICD-10-CM

## 2014-12-06 DIAGNOSIS — E119 Type 2 diabetes mellitus without complications: Secondary | ICD-10-CM | POA: Insufficient documentation

## 2014-12-06 DIAGNOSIS — K219 Gastro-esophageal reflux disease without esophagitis: Secondary | ICD-10-CM | POA: Insufficient documentation

## 2014-12-06 DIAGNOSIS — E669 Obesity, unspecified: Secondary | ICD-10-CM

## 2014-12-06 DIAGNOSIS — E1165 Type 2 diabetes mellitus with hyperglycemia: Secondary | ICD-10-CM | POA: Insufficient documentation

## 2014-12-06 DIAGNOSIS — K819 Cholecystitis, unspecified: Secondary | ICD-10-CM | POA: Insufficient documentation

## 2014-12-06 DIAGNOSIS — M791 Myalgia, unspecified site: Secondary | ICD-10-CM | POA: Insufficient documentation

## 2014-12-06 DIAGNOSIS — K5790 Diverticulosis of intestine, part unspecified, without perforation or abscess without bleeding: Secondary | ICD-10-CM | POA: Insufficient documentation

## 2014-12-06 DIAGNOSIS — I10 Essential (primary) hypertension: Secondary | ICD-10-CM | POA: Insufficient documentation

## 2014-12-06 DIAGNOSIS — N529 Male erectile dysfunction, unspecified: Secondary | ICD-10-CM | POA: Insufficient documentation

## 2014-12-06 DIAGNOSIS — M722 Plantar fascial fibromatosis: Secondary | ICD-10-CM

## 2014-12-06 DIAGNOSIS — L570 Actinic keratosis: Secondary | ICD-10-CM

## 2015-01-05 ENCOUNTER — Other Ambulatory Visit: Payer: Self-pay | Admitting: Family Medicine

## 2015-01-05 ENCOUNTER — Ambulatory Visit
Admission: RE | Admit: 2015-01-05 | Discharge: 2015-01-05 | Disposition: A | Discharge status: Home / Self Care | Payer: 59 | Source: Ambulatory Visit | Attending: Family Medicine | Admitting: Family Medicine

## 2015-01-05 DIAGNOSIS — M47892 Other spondylosis, cervical region: Secondary | ICD-10-CM | POA: Diagnosis not present

## 2015-01-05 DIAGNOSIS — M5410 Radiculopathy, site unspecified: Secondary | ICD-10-CM | POA: Diagnosis present

## 2015-01-05 DIAGNOSIS — R52 Pain, unspecified: Secondary | ICD-10-CM

## 2015-01-05 IMAGING — CR DG CERVICAL SPINE COMPLETE 4+V
1 series · 7 of 7 positions shown · non-contrast
Comparison: [DATE]

CLINICAL DATA: Acute right shoulder and scapula pain.

EXAM:
CERVICAL SPINE  4+ VIEWS

[Series 1: dg cervical spine complete · 0.14mm/px · 7 of 7 slices shown]
[im 1/7]
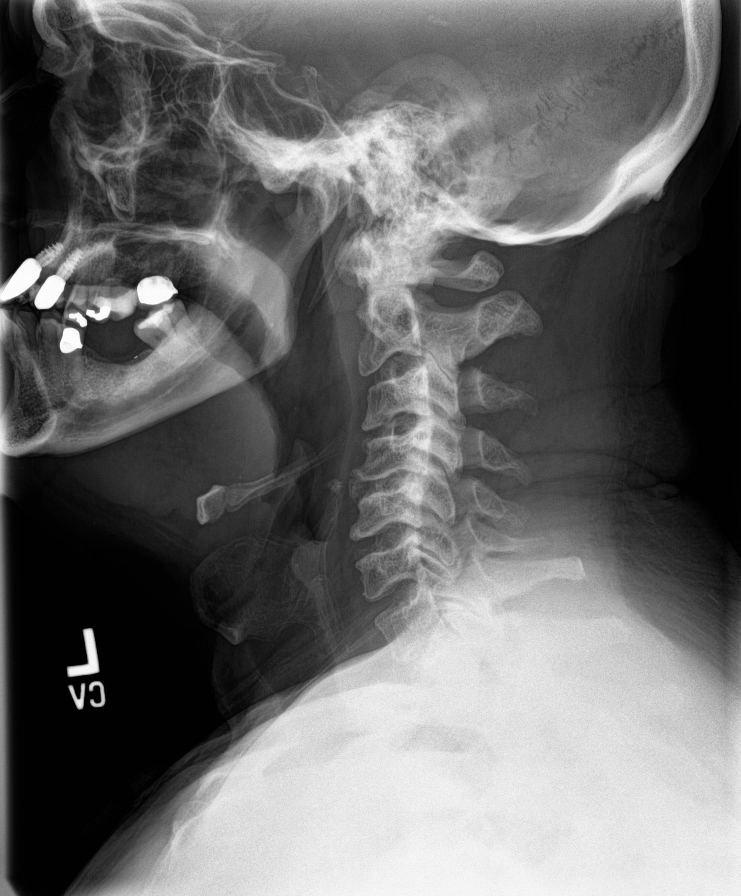
[im 2/7]
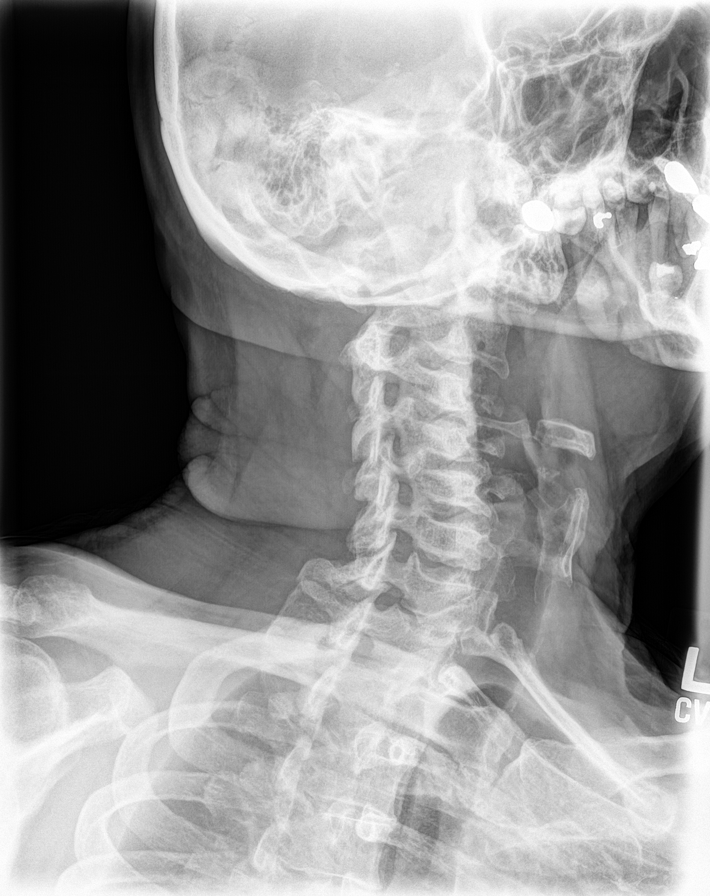
[im 3/7]
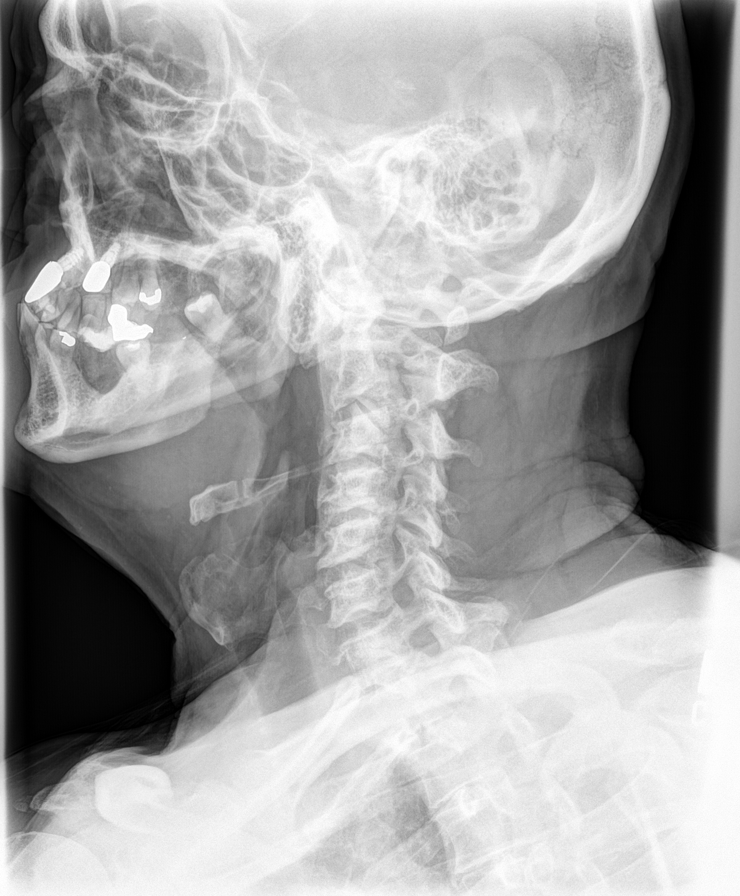
[im 4/7]
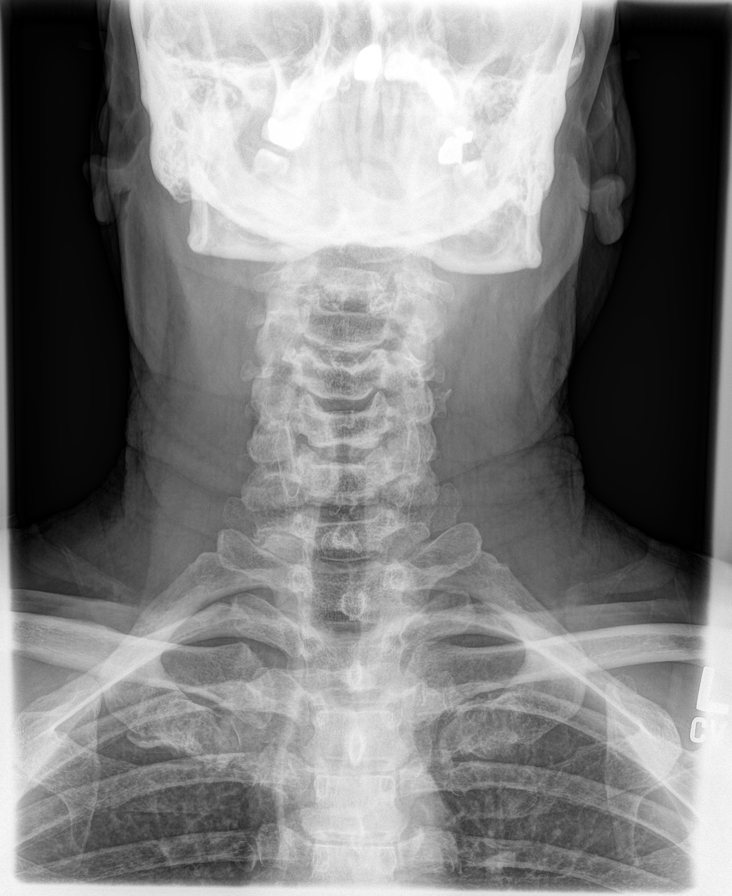
[im 5/7]
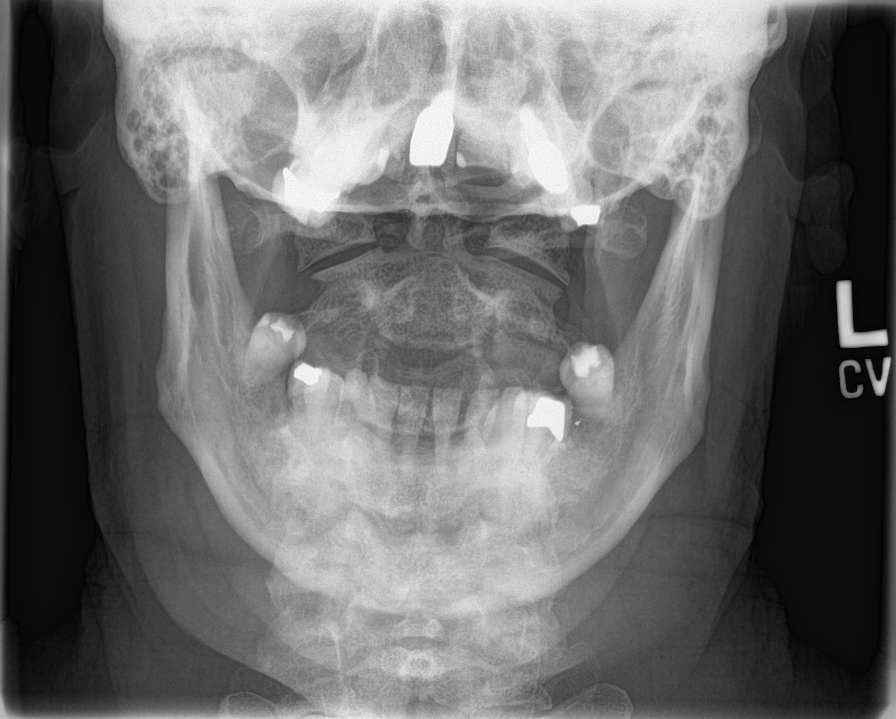
[im 6/7]
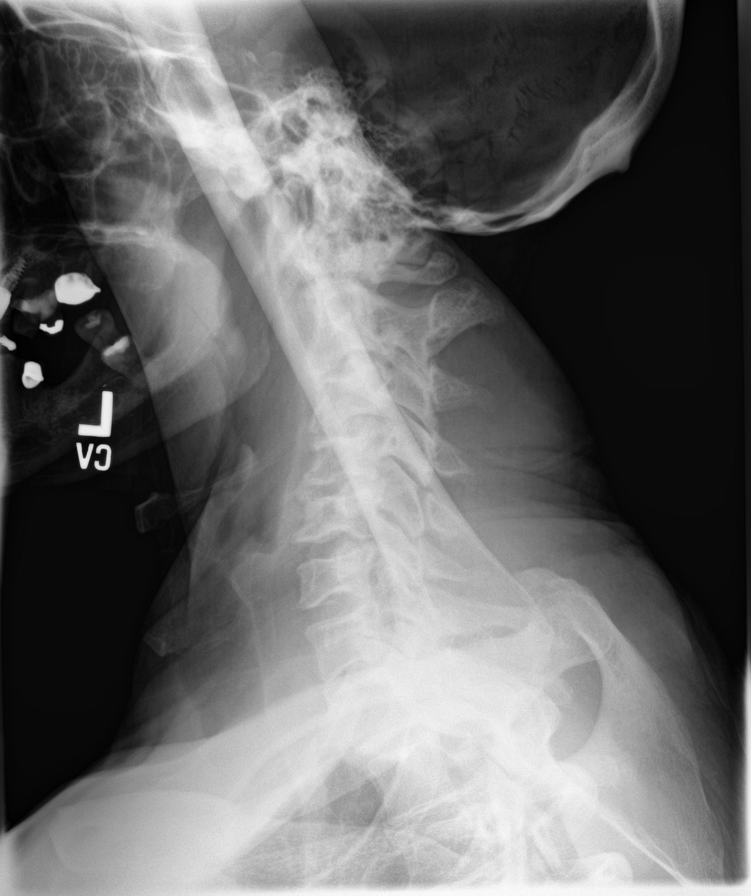
[im 7/7]
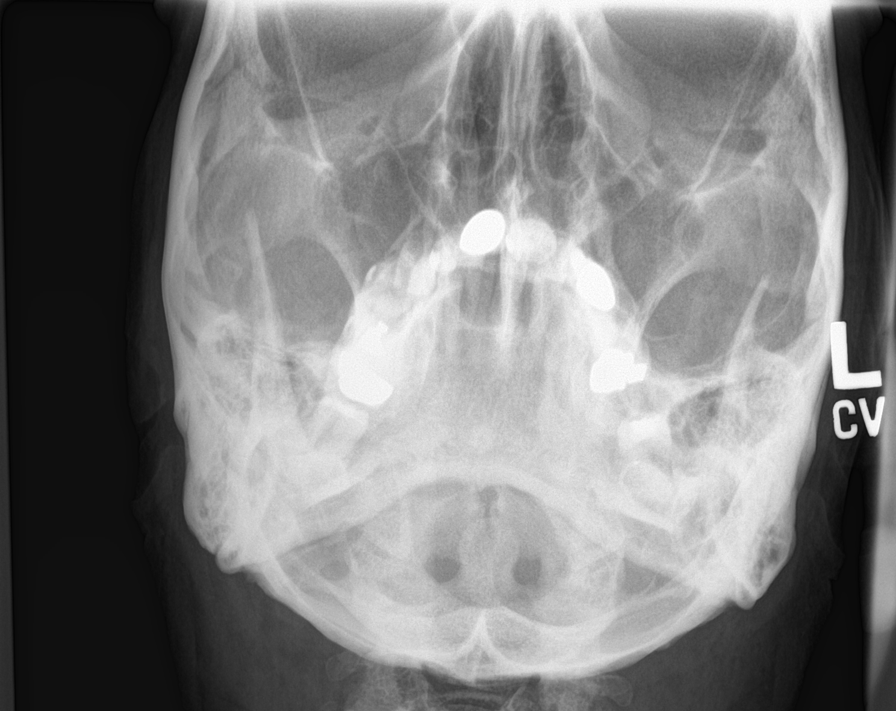

[7 of 7 positions shown; findings below may reference images not displayed]

FINDINGS: Diffuse cervical spondylosis and degenerative change at all levels.
Relatively preserved vertebral body heights. Negative for fracture
or focal kyphosis. Normal prevertebral soft tissues. Facets are
aligned. Slight narrowing of the neural foramina related to bony
spurring, on the right at C4-5 and the left at C3-4, C4-5, and C5-6.
Intact odontoid. Clear lung apices.
IMPRESSION: Cervical spondylosis and degenerative changes as described above. No
acute finding by plain radiography.

## 2015-01-17 ENCOUNTER — Ambulatory Visit: Payer: Self-pay | Admitting: Family Medicine

## 2015-02-15 ENCOUNTER — Emergency Department: Payer: 59

## 2015-02-15 ENCOUNTER — Encounter: Payer: Self-pay | Admitting: *Deleted

## 2015-02-15 DIAGNOSIS — Z79899 Other long term (current) drug therapy: Secondary | ICD-10-CM | POA: Diagnosis not present

## 2015-02-15 DIAGNOSIS — Y998 Other external cause status: Secondary | ICD-10-CM | POA: Insufficient documentation

## 2015-02-15 DIAGNOSIS — S52121A Displaced fracture of head of right radius, initial encounter for closed fracture: Secondary | ICD-10-CM | POA: Insufficient documentation

## 2015-02-15 DIAGNOSIS — Z7982 Long term (current) use of aspirin: Secondary | ICD-10-CM | POA: Insufficient documentation

## 2015-02-15 DIAGNOSIS — Y9289 Other specified places as the place of occurrence of the external cause: Secondary | ICD-10-CM | POA: Insufficient documentation

## 2015-02-15 DIAGNOSIS — S59901A Unspecified injury of right elbow, initial encounter: Secondary | ICD-10-CM | POA: Diagnosis present

## 2015-02-15 DIAGNOSIS — I10 Essential (primary) hypertension: Secondary | ICD-10-CM | POA: Diagnosis not present

## 2015-02-15 DIAGNOSIS — E119 Type 2 diabetes mellitus without complications: Secondary | ICD-10-CM | POA: Insufficient documentation

## 2015-02-15 DIAGNOSIS — Y9389 Activity, other specified: Secondary | ICD-10-CM | POA: Diagnosis not present
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
## 2015-02-16 ENCOUNTER — Emergency Department
Admission: EM | Admit: 2015-02-16 | Discharge: 2015-02-16 | Disposition: A | Discharge status: Home / Self Care | Payer: 59 | Attending: Emergency Medicine | Admitting: Emergency Medicine

## 2015-02-16 ENCOUNTER — Encounter: Payer: Self-pay | Admitting: Emergency Medicine

## 2015-02-16 DIAGNOSIS — S52121A Displaced fracture of head of right radius, initial encounter for closed fracture: Secondary | ICD-10-CM | POA: Diagnosis not present

## 2015-02-16 MED ORDER — IBUPROFEN 600 MG PO TABS
ORAL_TABLET | ORAL | Status: AC
  Administered 2015-02-16: 600 mg via ORAL
  Filled 2015-02-16: qty 1

## 2015-02-16 MED ORDER — IBUPROFEN 600 MG PO TABS
600.0000 mg | ORAL_TABLET | Freq: Once | ORAL | Status: AC
  Administered 2015-02-16: 600 mg via ORAL

## 2015-02-16 MED ORDER — BACITRACIN ZINC 500 UNIT/GM EX OINT
TOPICAL_OINTMENT | CUTANEOUS | Status: AC
  Administered 2015-02-16: 1 via TOPICAL
  Filled 2015-02-16: qty 0.9

## 2015-02-16 MED ORDER — BACITRACIN 500 UNIT/GM EX OINT
1.0000 "application " | TOPICAL_OINTMENT | Freq: Once | CUTANEOUS | Status: AC
  Administered 2015-02-16: 1 via TOPICAL

## 2015-02-16 NOTE — ED Notes (Signed)
D/c instructions reviewed w/ pt - pt denies any further questions or concerns at present.

## 2015-02-16 NOTE — Discharge Instructions (Signed)
Elbow Fracture, Radial Head with Rehab The radial head is the end of the forearm bone on the thumb side of the arm (radius). It is part of the elbow. The radial head is vulnerable to both complete and incomplete fractures. SYMPTOMS   Severe elbow and arm pain, at the time of injury.  Tenderness, inflammation, and later bruising (contusion) of the elbow (within 48 hours).  Visible deformity, if the fracture is complete, and the bone fragments are not aligned properly (displaced).  Numbness, coldness, or paralysis in the elbow, forearm, or hand from pressure on the blood vessels or nerves (uncommon). CAUSES  An elbow fracture occurs when a force is placed on the bone that is greater than it can handle. Typical causes of injury include:  Indirect trauma, such as falling on an outstretched hand (most common cause).  Direct hit (trauma) to the elbow.  Twisting injury to the elbow. RISK INCREASES WITH:  Contact sports (football, rugby).  Sports in which falling is likely (skating, basketball).  Children under 71 years of age and adults over 56.  Bone or joint disease (osteoarthritis, bone tumor).  Poor strength and flexibility. PREVENTION   Warm up and stretch properly before activity.  Maintain physical fitness:  Strength, flexibility, and endurance.  Cardiovascular fitness.  When appropriate, wear properly fitted and padded elbow protection. PROGNOSIS  If treated properly, elbow fractures often heal within 6 to 8 weeks for adults, and 4 to 6 weeks in children.  RELATED COMPLICATIONS   Fracture does not heal (nonunion).  Fracture heals in improper alignment (malunion).  Chronic pain, stiffness, loss of motion, or swelling of the elbow.  Excessive bleeding in the elbow or at the fracture site, causing pressure and injury to nerves and blood vessels (uncommon).  Calcification of the soft tissues around the elbow (heterotopic ossification).  Risk of bone death, due to  interrupted blood supply caused by the fracture.  Unstable or arthritic joint, following repeated injury.  Stopping of normal bone growth in children.  Wasting away (atrophy), weakness, stiffness, numbness, and poor control of the hand, due to injury to blood vessels, nerves, cartilage, muscle, ligaments, and connective tissue sheets (fascia). TREATMENT  If the fracture is displaced, it must be put back in proper alignment (reduced) by an individual trained in the procedure. Often, displaced fractures cannot be realigned by hand, and surgery is needed. Once the bones are aligned (with or without surgery), ice and medicine can be used to reduce pain and inflammation. The elbow should be restrained for at least 1 to 2 weeks. After restraint, it is important to complete strengthening and stretching exercises, to regain strength and a full range of motion. Theses exercises may be completed at home or with a therapist.  MEDICATION   If pain medicine is needed, nonsteroidal anti-inflammatory medicines (aspirin and ibuprofen), or other minor pain relievers (acetaminophen), are often advised.  Do not take pain medicine for 7 days before surgery.  Prescription pain relievers may be given, if your caregiver thinks they are needed. Use only as directed and only as much as you need. COLD THERAPY  Cold treatment (icing) should be applied for 10 to 15 minutes every 2 to 3 hours for inflammation and pain, and immediately after activity that aggravates your symptoms. Use ice packs or an ice massage. SEEK MEDICAL CARE IF:  Pain, tenderness, or swelling gets worse, despite treatment.  You experience pain, numbness, or coldness in the hand.  Blue, gray, or dark color appears in the  fingernails.  Any of the following occur after surgery: fever, increased pain, swelling, redness, drainage of fluids, or bleeding in the affected area.  New, unexplained symptoms develop. (Drugs used in treatment may produce side  effects.) EXERCISES  RANGE OF MOTION (ROM) AND STRETCHING EXERCISES - Elbow Fracture (Radial Head) These exercises may help you restore your elbow mobility once your physician has discontinued your restraint period. Beginning these before your caregiver's approval may result in delayed healing. Your symptoms may go away with or without further involvement from your physician, physical therapist or athletic trainer. While completing these exercises, remember:   Restoring tissue flexibility helps normal motion to return to the joints. This allows healthier, less painful movement and activity.  An effective stretch should be held for at least 30 seconds. A stretch should never be painful. You should only feel a gentle lengthening or release in the stretch. RANGE OF MOTION - Supination, Active-Assisted  Sit with your right / left elbow bent at 90 degrees, resting your forearm on a table.  Keeping your upper body and shoulder in place, roll your forearm so your palm faces upward. When you can go no farther, use your opposite hand to help, until you feel a gentle to moderate stretch. Hold for __________ seconds.  Slowly release the stretch and return to the starting position. Repeat 10 times. Complete this exercise to times per day. RANGE OF MOTION - Pronation, Active-Assisted   Sit with your right / left elbow bent at 90 degrees, resting your forearm on a table.  Keeping your upper body and shoulder in place, roll your forearm so your palm faces the tabletop. When you can go no farther, use your opposite hand to help, until you feel a gentle to moderate stretch. Hold for 3 seconds.  Slowly release the stretch and return to the starting position. Repeat 10 times. Complete this exercise to times per day. RANGE OF MOTION - Extension  Hold your right / left arm at your side and straighten your elbow as far as you can, using your right / left arm muscles.  Straighten the right / left elbow farther  by gently pushing down on your forearm, until you feel a gentle stretch on the inside of your elbow. Hold this position for __________ seconds.  Slowly return to the starting position. Repeat __________ times. Complete this exercise __________ times per day.  RANGE OF MOTION - Flexion  Hold your right / left arm at your side and bend your elbow as far as you can, using your right / left arm muscles.  Bend the right / left elbow farther by gently pushing up on your forearm, until you feel a gentle stretch on the outside of your elbow. Hold this position for __________ seconds.  Slowly return to the starting position. Repeat 10 times. Complete this exercise to times per day.  RANGE OF MOTION - Supination, Active  Stand or sit with your elbows at your side. Bend your right / left elbow to 90 degrees.  Turn your palm upward until you feel a gentle stretch on the inside of your forearm.  Hold this position for __________ seconds. Slowly release and return to the starting position. Repeat 10 times. Complete this stretch to times per day.  RANGE OF MOTION - Pronation, Active  Stand or sit with your elbows at your side. Bend your right / left elbow to 90 degrees.  Turn your palm downward until you feel a gentle stretch on the top of  your forearm.  Hold this position for __________ seconds. Slowly release and return to the starting position. Repeat __________ times. Complete this stretch __________ times per day.  RANGE OF MOTION - Elbow Flexion, Supine  Lie on your back. Extend your right / left arm into the air, bracing it with your opposite hand. Allow your right / left arm to relax.  Let your elbow bend, allowing your hand to fall slowly toward your chest.  You should feel a gentle stretch along the back of your upper arm and elbow. Your physician, physical therapist or athletic trainer may ask you to hold a __________ hand weight, to increase the intensity of this stretch.  Hold for  __________ seconds. Slowly return your right / left arm to the upright position. Repeat __________ times. Complete this exercise __________ times per day. STRETCH - Elbow flexors   Lie on a firm bed or countertop, on your back. Be sure that you are in a comfortable position which will allow you to relax your arm muscles.  Place a folded towel under your right / left upper arm, so that your elbow and shoulder are at the same height. Extend your arm; your elbow should not rest on the bed or towel.  Allow the weight of your hand to straighten your elbow. Keep your arm and chest muscles relaxed. Your caregiver may ask you to increase the intensity of your stretch by adding a small wrist or hand weight.  Hold for __________ seconds. You should feel a stretch on the inside of your elbow. Slowly return to the starting position. Repeat __________ times. Complete this exercise __________ times per day. STRENGTHENING EXERCISES - Elbow Fracture (Radial Head) These exercises may help you restore your elbow mobility once your physician has discontinued your restraint period. Beginning these before your caregiver's approval may result in delayed healing. Your symptoms may go away with or without further involvement from your physician, physical therapist or athletic trainer. While completing these exercises, remember:   Restoring tissue flexibility helps normal motion to return to the joints. This allows healthier, less painful movement and activity.  An effective stretch should be held for at least 30 seconds. A stretch should never be painful. You should only feel a gentle lengthening or release in the stretch.  You may experience muscle soreness or fatigue, but the pain or discomfort you are trying to eliminate should never worsen during these exercises. If this pain does get worse, stop and make sure you are following the directions exactly. If the pain is still present after adjustments, discontinue the  exercise until you can discuss the trouble with your caregiver. STRENGTH - Elbow Flexors, Isometric  Stand or sit upright, on a firm surface. Place your right / left arm so that your hand is palm-up, and at the height of your waist.  Place your opposite hand on top of your forearm. Gently push down as your right / left arm resists. Push as hard as you can with both arms, without causing any pain or movement at your right / left elbow. Hold this stationary position for __________ seconds.  Gradually release the tension in both arms. Allow your muscles to relax completely before repeating. Repeat __________ times. Complete this exercise __________ times per day. STRENGTH - Elbow Extensors, Isometric  Stand or sit upright, on a firm surface. Place your right / left arm so that your palm faces your stomach, and it is at the height of your waist.  Place your opposite  hand on the underside of your forearm. Gently push up as your right / left arm resists. Push as hard as you can with both arms, without causing any pain or movement at your right / left elbow. Hold this stationary position for __________ seconds.  Gradually release the tension in both arms. Allow your muscles to relax completely before repeating. Repeat __________ times. Complete this exercise __________ times per day. STRENGTH - Elbow Flexors, Supinated  With good posture, stand, or sit on a firm chair without armrests. Allow your right / left arm to rest at your side with your palm facing forward.  Holding a __________ weight, or gripping a rubber exercise band or tubing, bring your hand toward your shoulder.  Allow your muscles to control the resistance, as your hand returns to your side. Repeat __________ times. Complete this exercise __________ times per day.  STRENGTH - Elbow Extensors, Dynamic  With good posture, stand, or sit on a firm chair without armrests. Keeping your upper arms at your side, bring both hands up toward  your right / left shoulder, while gripping a rubber exercise band or tubing. Your right / left hand should be just below the other hand.  Straighten your right / left elbow. Hold for __________ seconds.  Allow your muscles to control the rubber exercise band, as your hand returns to your shoulder. Repeat __________ times. Complete this exercise __________ times per day. STRENGTH - Forearm Supinators   Sit with your right / left forearm supported on a table, keeping your elbow below shoulder height. Rest your hand over the edge, palm down.  Gently grip a hammer or a soup ladle.  Without moving your elbow, slowly turn your palm and hand upward to a "thumbs-up" position.  Hold this position for __________ seconds. Slowly return to the starting position. Repeat __________ times. Complete this exercise __________ times per day.  STRENGTH - Forearm Pronators  Sit with your right / left forearm supported on a table, keeping your elbow below shoulder height. Rest your hand over the edge, palm up.  Gently grip a hammer or a soup ladle.  Without moving your elbow, slowly turn your palm and hand upward to a "thumbs-up" position.  Hold this position for 3 seconds. Slowly return to the starting position. Repeat 10 times. Complete this exercise to times per day.  Document Released: 07/29/2005 Document Revised: 10/21/2011 Document Reviewed: 11/10/2008 [facility] Patient Information 2015 [place], [place]. This information is not intended to replace advice given to you by your health care provider. Make sure you discuss any questions you have with your health care provider.
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
## 2015-02-17 DIAGNOSIS — S52123A Displaced fracture of head of unspecified radius, initial encounter for closed fracture: Secondary | ICD-10-CM | POA: Insufficient documentation

## 2015-03-23 ENCOUNTER — Encounter: Payer: Self-pay | Admitting: Family Medicine

## 2015-03-23 ENCOUNTER — Ambulatory Visit (INDEPENDENT_AMBULATORY_CARE_PROVIDER_SITE_OTHER): Payer: 59 | Admitting: Family Medicine

## 2015-03-23 VITALS — BP 134/82 | Temp 97.3°F | Ht 67.0 in | Wt 202.0 lb

## 2015-03-23 DIAGNOSIS — E663 Overweight: Secondary | ICD-10-CM | POA: Diagnosis not present
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
## 2015-04-11 ENCOUNTER — Other Ambulatory Visit: Payer: Self-pay

## 2015-04-11 MED ORDER — GLUCOSE BLOOD VI STRP: ORAL_STRIP | Status: DC

## 2015-04-11 MED ORDER — ACCU-CHEK SOFTCLIX LANCET DEV MISC: Status: DC

## 2015-04-13 ENCOUNTER — Other Ambulatory Visit: Payer: Self-pay | Admitting: Family Medicine

## 2015-04-18 ENCOUNTER — Encounter: Payer: Self-pay | Admitting: Family Medicine

## 2015-04-18 ENCOUNTER — Ambulatory Visit (INDEPENDENT_AMBULATORY_CARE_PROVIDER_SITE_OTHER): Payer: 59 | Admitting: Family Medicine

## 2015-04-18 VITALS — BP 138/66 | HR 64 | Temp 97.6°F | Resp 16 | Wt 209.0 lb

## 2015-04-18 DIAGNOSIS — I10 Essential (primary) hypertension: Secondary | ICD-10-CM | POA: Diagnosis not present

## 2015-04-18 DIAGNOSIS — N529 Male erectile dysfunction, unspecified: Secondary | ICD-10-CM | POA: Insufficient documentation

## 2015-04-18 DIAGNOSIS — G8929 Other chronic pain: Secondary | ICD-10-CM

## 2015-04-18 DIAGNOSIS — E119 Type 2 diabetes mellitus without complications: Secondary | ICD-10-CM | POA: Diagnosis not present

## 2015-04-18 DIAGNOSIS — K219 Gastro-esophageal reflux disease without esophagitis: Secondary | ICD-10-CM | POA: Diagnosis not present

## 2015-04-18 DIAGNOSIS — E669 Obesity, unspecified: Secondary | ICD-10-CM | POA: Diagnosis not present

## 2015-04-18 DIAGNOSIS — G4489 Other headache syndrome: Secondary | ICD-10-CM | POA: Diagnosis not present

## 2015-04-18 DIAGNOSIS — E785 Hyperlipidemia, unspecified: Secondary | ICD-10-CM

## 2015-04-18 DIAGNOSIS — Z23 Encounter for immunization: Secondary | ICD-10-CM

## 2015-04-18 DIAGNOSIS — M25562 Pain in left knee: Secondary | ICD-10-CM | POA: Diagnosis not present

## 2015-04-18 LAB — POCT GLYCOSYLATED HEMOGLOBIN (HGB A1C): Hemoglobin A1C: 7.1

## 2015-04-18 NOTE — Progress Notes (Signed)
Patient ID: Jeffrey [name], male   DOB: [birth year], 59 y.o.   MRN: [id number]    Subjective:  HPI  Hypertension follow up: Patient is here for follow up. Patient is not checking his B/P. Symptoms include headache. No chest pain, no SOB.  Diabetes follow up: Patient's last A1C was 8.5 and it is 7.1 today. Patient checks his sugar at home and fasting sugars are around 200. Last eye exam was a little over a year ago and has another appointment scheduled.  GERD follow up: Patient's symptoms are stable.  Chronic arthritic pain of the knee. This is not bad enough to require intervention with orthopedics.  Prior to Admission medications   Medication Sig Start Date End Date Taking? Authorizing Provider  amLODipine (NORVASC) 10 MG tablet Take 10 mg by mouth daily.   Yes Historical Provider, MD  aspirin 81 MG tablet Take 81 mg by mouth daily.   Yes Historical Provider, MD  Canagliflozin-Metformin HCl [id number] MG TABS Take 1 tablet by mouth daily.   Yes Historical Provider, MD  fenofibrate 160 MG tablet Take 1 tablet by mouth  daily 04/13/15  Yes Richard Maceo Pro., MD  fluticasone [facility]) 50 MCG/ACT nasal spray Place into both nostrils daily.   Yes Historical Provider, MD  gabapentin (NEURONTIN) 100 MG capsule Take 200 mg by mouth every morning.   Yes Historical Provider, MD  glucose blood test strip Check sugar once daily 04/11/15  Yes Jerrol Banana., MD  Lancet [facility]) lancets Check sugar once daily 04/11/15  Yes Jerrol Banana., MD  loratadine (CLARITIN) 10 MG tablet Take 10 mg by mouth daily.   Yes Historical Provider, MD  losartan (COZAAR) 100 MG tablet Take 100 mg by mouth daily.   Yes Historical Provider, MD  omeprazole (PRILOSEC) 20 MG capsule Take 20 mg by mouth daily.   Yes Historical Provider, MD  Jonetta Speak LANCETS [address] [place]  04/11/15  Yes Historical Provider, MD  sildenafil (VIAGRA) 100 MG tablet Take 100 mg by mouth daily as needed for  erectile dysfunction.   Yes Historical Provider, MD    Patient Active Problem List   Diagnosis Date Noted  . ED (erectile dysfunction) of organic origin 04/18/2015  . Hypertension 12/06/2014  . Seasonal allergies 12/06/2014  . Cholecystitis 12/06/2014  . Diverticulosis 12/06/2014  . GERD (gastroesophageal reflux disease) 12/06/2014  . Diabetes mellitus 12/06/2014  . Plantar fasciitis 12/06/2014  . Actinic keratosis 12/06/2014  . Peyronie's disease 12/06/2014  . Erectile dysfunction 12/06/2014  . Obesity 12/06/2014  . Hyperlipidemia 12/06/2014  . Myalgia 12/06/2014    Past Medical History  Diagnosis Date  . Plantar fasciitis   . Cholecystitis   . Actinic keratosis   . Peyronie's disease   . Erectile dysfunction   . Diverticulosis   . Obesity   . Hyperlipidemia   . Diabetes mellitus   . Myalgia   . Seasonal allergies   . GERD (gastroesophageal reflux disease)   . Hypertension   . Kidney stones     Social History   Social History  . Marital Status: Married    Spouse Name: N/A  . Number of Children: N/A  . Years of Education: N/A   Occupational History  . Not on file.   Social History Main Topics  . Smoking status: Former Smoker -- 1.50 packs/day    Types: Cigarettes    Quit date: 08/11/1984  . Smokeless tobacco: Never Used  . Alcohol Use: No  .  Drug Use: No  . Sexual Activity: Yes   Other Topics Concern  . Not on file   Social History Narrative    Allergies  Allergen Reactions  . Codeine     Review of Systems  Constitutional: Negative.   Respiratory: Negative.   Cardiovascular: Negative.   Gastrointestinal: Negative.   Musculoskeletal: Positive for joint pain. Negative for myalgias, back pain, falls and neck pain.  Neurological: Positive for headaches. Negative for dizziness.  Psychiatric/Behavioral: Negative.     Immunization History  Administered Date(s) Administered  . Pneumococcal Polysaccharide-23 06/28/2010  . Tdap 10/30/2007    Objective:  BP 138/66 mmHg  Pulse 64  Temp(Src) 97.6 F (36.4 C)  Resp 16  Wt 209 lb (94.802 kg)  Physical Exam  Constitutional: He is oriented to person, place, and time and well-developed, well-nourished, and in no distress.  HENT:  Head: Normocephalic and atraumatic.  Right Ear: External ear normal.  Left Ear: External ear normal.  Nose: Nose normal.  Eyes: Conjunctivae are normal. Pupils are equal, round, and reactive to light.  Neck: Normal range of motion.  Cardiovascular: Normal rate, regular rhythm, normal heart sounds and intact distal pulses.   No murmur heard. Pulmonary/Chest: Effort normal and breath sounds normal. No respiratory distress. He has no wheezes.  Abdominal: Soft.  Musculoskeletal: Normal range of motion. He exhibits no edema or tenderness.  Neurological: He is alert and oriented to person, place, and time. Gait normal.  Skin: Skin is warm and dry.  Psychiatric: Mood, memory, affect and judgment normal.    Lab Results  Component Value Date   WBC 9.1 08/22/2014   HGB 16.6 08/22/2014   HCT 48 08/22/2014   PLT 243 08/22/2014   CHOL 240* 08/22/2014   TRIG 847* 08/22/2014   HDL 24* 08/22/2014   LDLCALC 0 08/22/2014   PSA 1.1 08/22/2014   HGBA1C 8.5* 04/22/2014    CMP     Component Value Date/Time   NA 136* 08/22/2014   K 4.0 08/22/2014   BUN 17 08/22/2014   CREATININE 1.1 08/22/2014   AST 18 08/22/2014   ALT 19 08/22/2014   ALKPHOS 58 08/22/2014    Assessment and Plan :  1. Essential hypertension Stable.  2. Other headache syndrome Patient states this is not a new sensation. Patient gets this pain off and on. Follow for now.  3. Gastroesophageal reflux disease, esophagitis presence not specified stable  4. Type 2 diabetes mellitus without complication Y7W 7.1 today. Better. Could not get urine microalbumin today due to not having the test today in stock. - POCT HgB A1C patient has eye exam scheduled October 1  5.  Hyperlipidemia Lipids on the next visit.  6. Obesity Continue working on habits.  7. Need for influenza vaccination - Flu Vaccine QUAD 36+ mos IM (Fluarix & Fluzone Quad PF  8. Left knee pain Not severe per patient, offered referral back to orthopedic when patient wishes.     Patient was seen and examined by Dr. Eulas Post and note was scribed by Theressa Millard, RMA.    Miguel Aschoff MD [facility] 04/18/2015 2:38 PM

## 2015-04-20 DIAGNOSIS — Z23 Encounter for immunization: Secondary | ICD-10-CM

## 2015-04-25 LAB — HM DIABETES EYE EXAM

## 2015-05-03 ENCOUNTER — Other Ambulatory Visit: Payer: Self-pay | Admitting: Family Medicine

## 2015-05-03 NOTE — Telephone Encounter (Signed)
Pt contacted office for refill request on the following medications:  fluticasone (FLONASE) 50 MCG/ACT nasal spray.  [facility].  MC#[phone]/[id number]

## 2015-05-04 MED ORDER — FLUTICASONE PROPIONATE 50 MCG/ACT NA SUSP: 1.0000 | Freq: Every day | NASAL | Status: DC

## 2015-05-04 NOTE — Telephone Encounter (Signed)
Done-aa 

## 2015-05-10 ENCOUNTER — Encounter: Payer: Self-pay | Admitting: Emergency Medicine

## 2015-05-10 ENCOUNTER — Ambulatory Visit
Admission: EM | Admit: 2015-05-10 | Discharge: 2015-05-10 | Disposition: A | Discharge status: Home / Self Care | Payer: Self-pay | Attending: Family Medicine | Admitting: Family Medicine

## 2015-05-10 DIAGNOSIS — Z0289 Encounter for other administrative examinations: Secondary | ICD-10-CM

## 2015-05-10 LAB — DEPT OF TRANSP DIPSTICK, URINE (ARMC ONLY)
Glucose, UA: 500 mg/dL — AB
Hgb urine dipstick: NEGATIVE
Protein, ur: NEGATIVE mg/dL
Specific Gravity, Urine: 1.01 (ref 1.005–1.030)

## 2015-05-10 NOTE — ED Notes (Signed)
Dot physical
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
## 2015-06-03 ENCOUNTER — Other Ambulatory Visit: Payer: Self-pay | Admitting: Family Medicine

## 2015-06-08 IMAGING — US US ABDOMEN LIMITED
1 series · 14 of 25 positions shown · non-contrast
Comparison: CT [DATE]

CLINICAL DATA: Right upper quadrant pain radiating to the back. 12
hours duration. Leukocytosis.

EXAM:
US ABDOMEN LIMITED - RIGHT UPPER QUADRANT

[Series 1: us abdomen limited · 0.28mm/px · 14 of 34 slices shown]
[im 1/34]
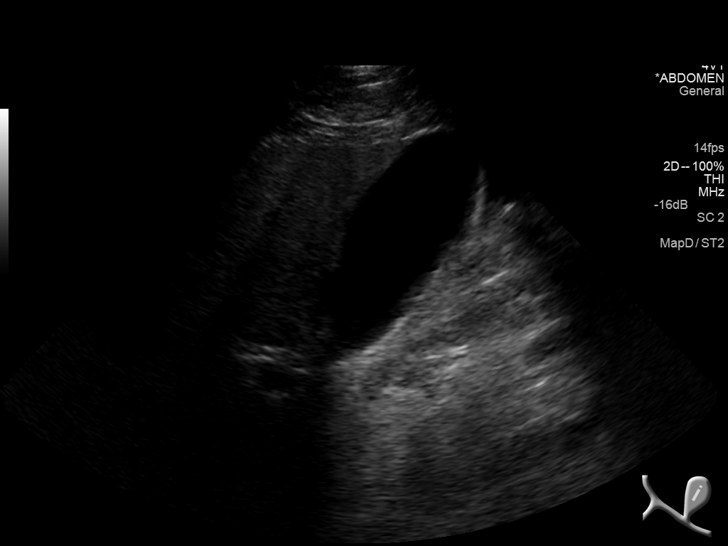
[im 3/34]
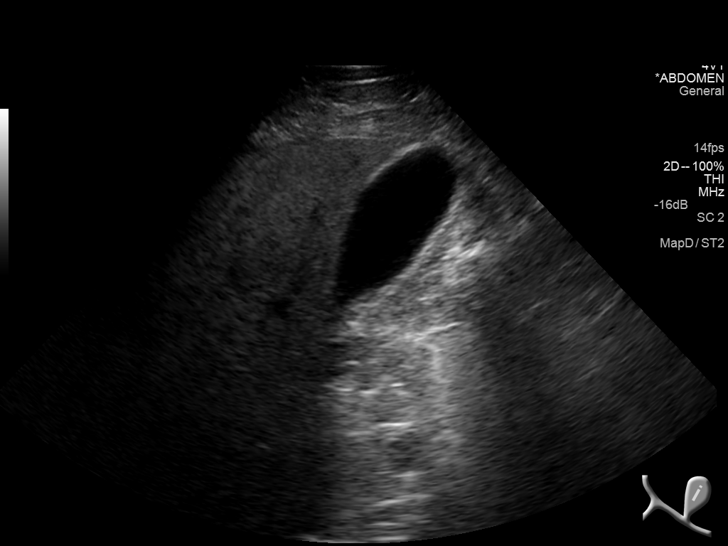
[im 6/34]
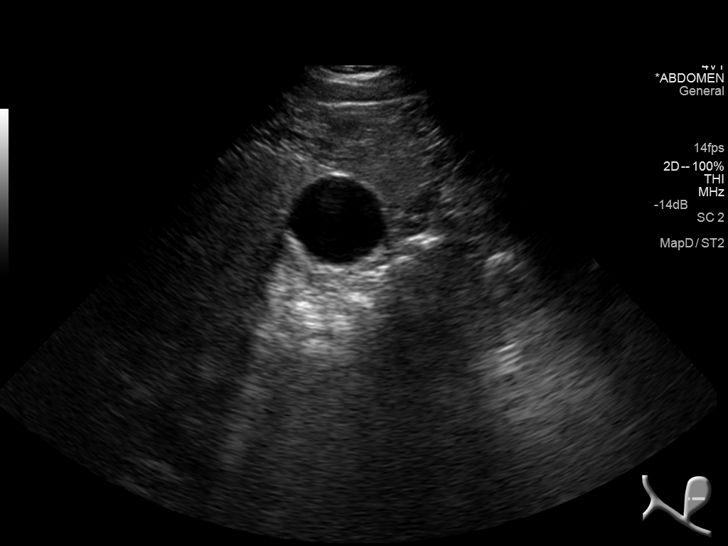
[im 9/34]
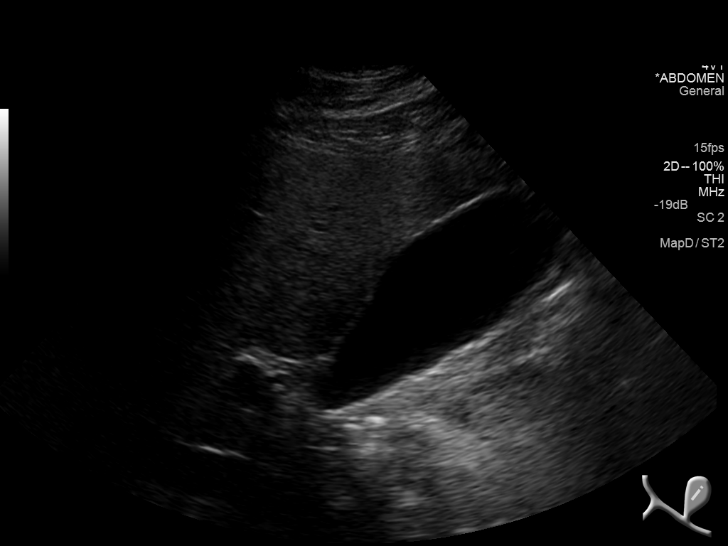
[im 12/34]
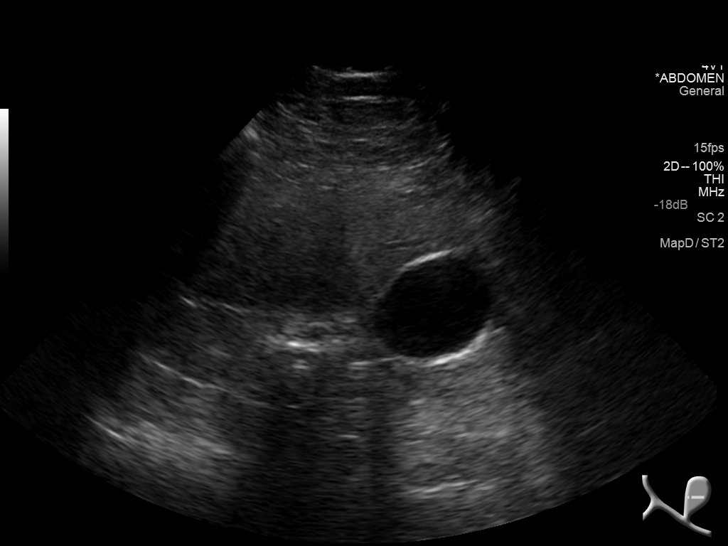
[im 13/34]
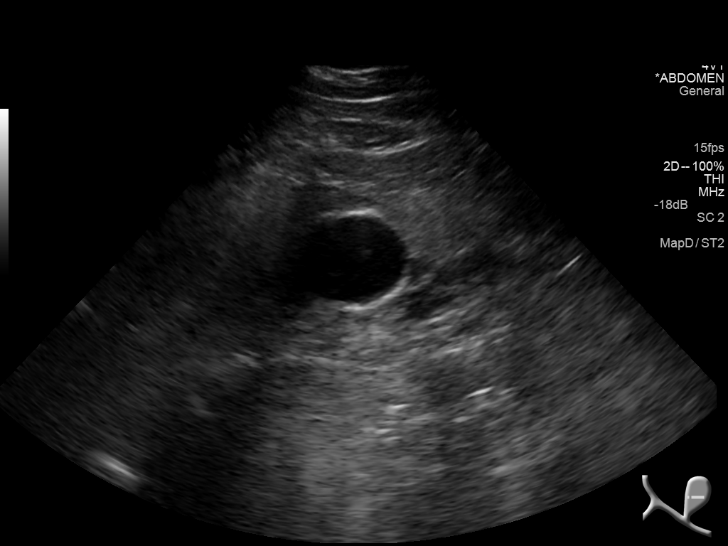
[im 16/34]
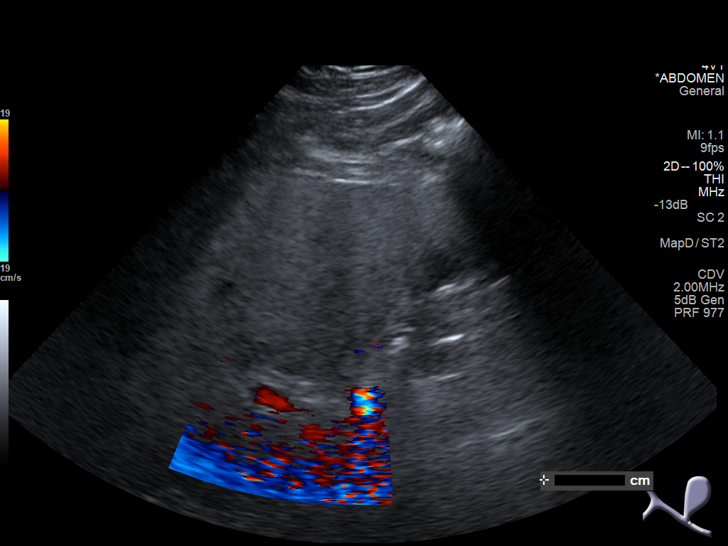
[im 18/34]
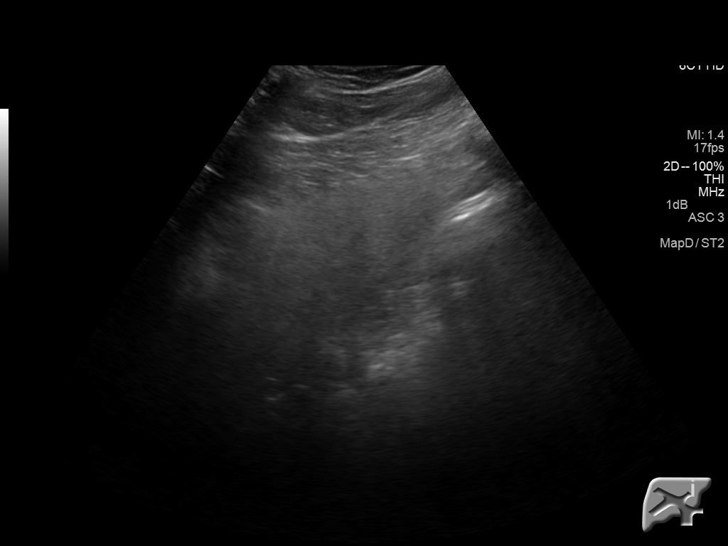
[im 21/34]
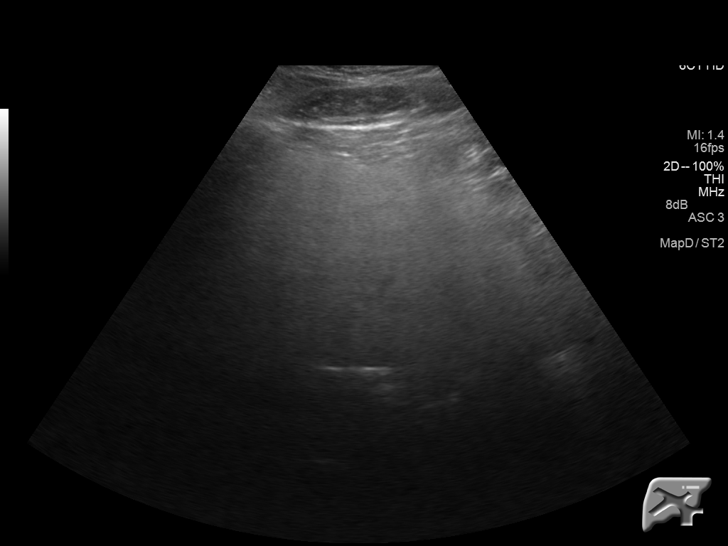
[im 23/34]
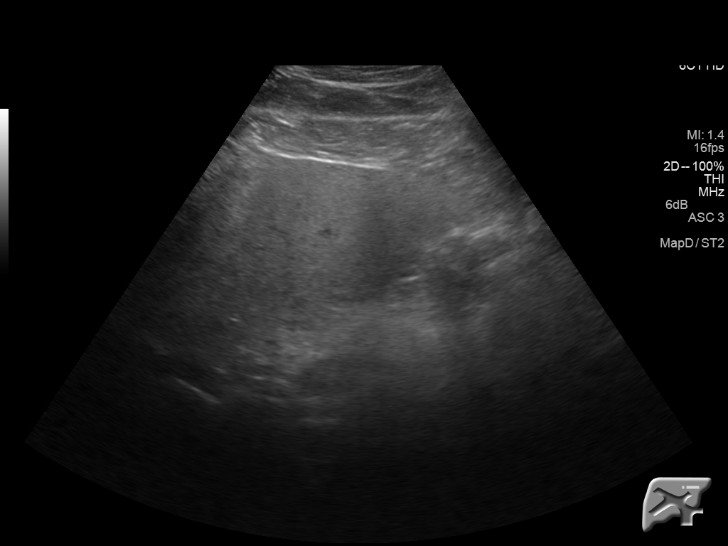
[im 25/34]
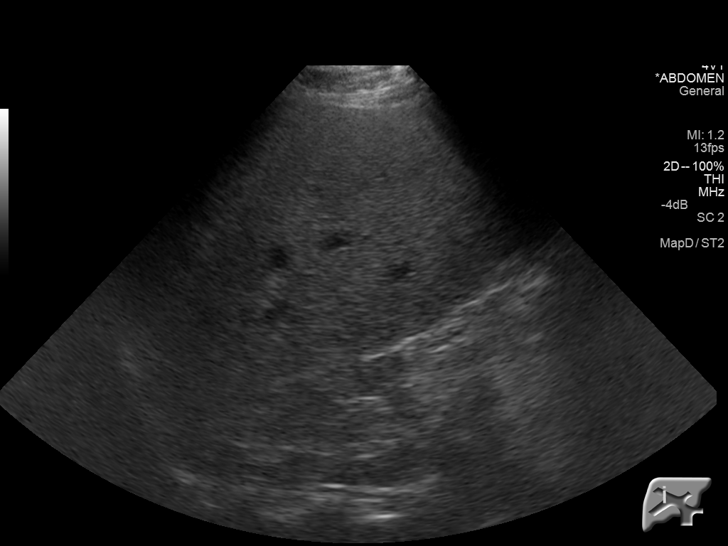
[im 28/34]
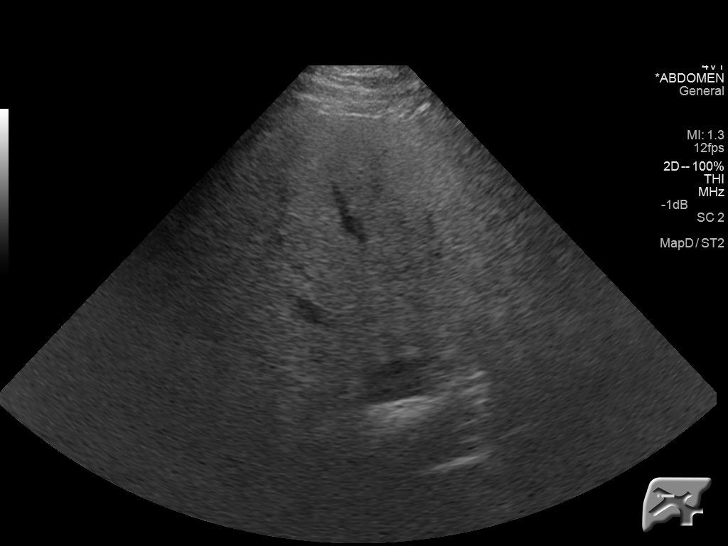
[im 31/34]
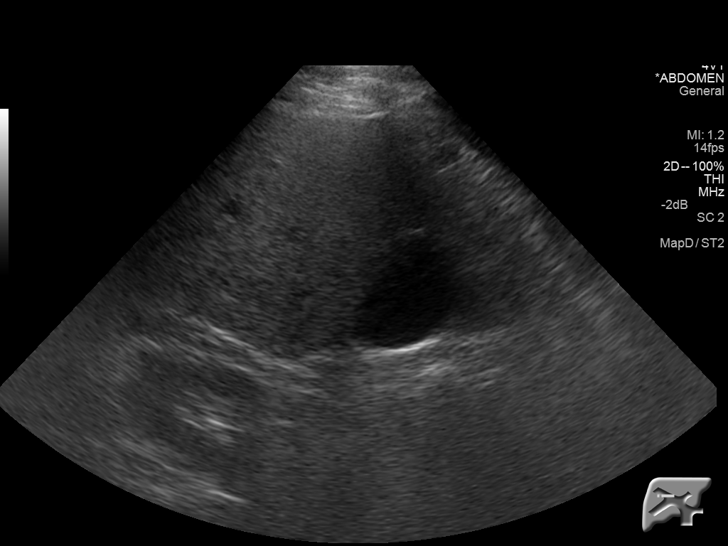
[im 34/34]
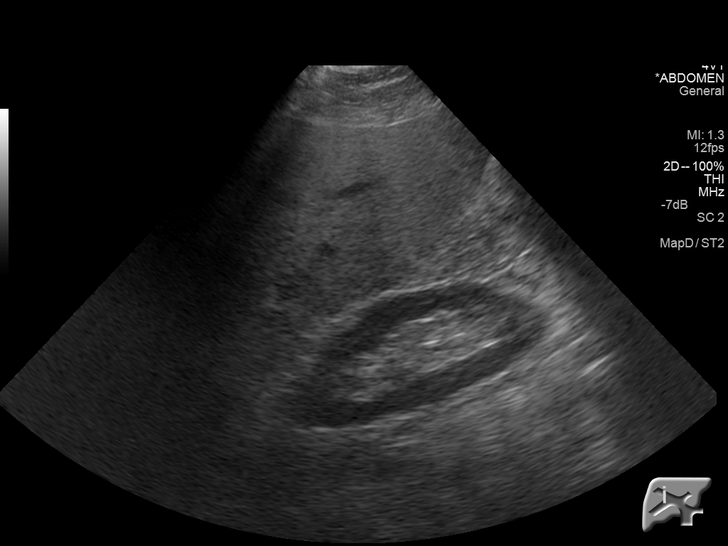

[14 of 25 positions shown; findings below may reference images not displayed]

FINDINGS: Gallbladder:

No gallstones or wall thickening visualized. No sonographic Murphy
sign noted.

Common bile duct:

Diameter: 2.9 mm

Liver:

Diffuse hepatic echogenicity consistent with fatty infiltration. No
focal liver lesion.
IMPRESSION: Dense liver, likely fatty infiltration. Normal gallbladder and bile
ducts.

## 2015-07-15 ENCOUNTER — Encounter: Payer: Self-pay | Admitting: Emergency Medicine

## 2015-07-15 ENCOUNTER — Emergency Department: Payer: 59

## 2015-07-15 ENCOUNTER — Inpatient Hospital Stay
Admission: EM | Admit: 2015-07-15 | Discharge: 2015-07-19 | DRG: 440 | Disposition: A | Discharge status: Home / Self Care | Payer: 59 | Attending: Internal Medicine | Admitting: Internal Medicine

## 2015-07-15 DIAGNOSIS — Z87442 Personal history of urinary calculi: Secondary | ICD-10-CM

## 2015-07-15 DIAGNOSIS — M791 Myalgia: Secondary | ICD-10-CM | POA: Diagnosis present

## 2015-07-15 DIAGNOSIS — K859 Acute pancreatitis without necrosis or infection, unspecified: Secondary | ICD-10-CM | POA: Diagnosis not present

## 2015-07-15 DIAGNOSIS — E669 Obesity, unspecified: Secondary | ICD-10-CM | POA: Diagnosis present

## 2015-07-15 DIAGNOSIS — Z79899 Other long term (current) drug therapy: Secondary | ICD-10-CM

## 2015-07-15 DIAGNOSIS — R079 Chest pain, unspecified: Secondary | ICD-10-CM

## 2015-07-15 DIAGNOSIS — K219 Gastro-esophageal reflux disease without esophagitis: Secondary | ICD-10-CM | POA: Diagnosis present

## 2015-07-15 DIAGNOSIS — I4510 Unspecified right bundle-branch block: Secondary | ICD-10-CM | POA: Diagnosis present

## 2015-07-15 DIAGNOSIS — N529 Male erectile dysfunction, unspecified: Secondary | ICD-10-CM | POA: Diagnosis present

## 2015-07-15 DIAGNOSIS — Z87891 Personal history of nicotine dependence: Secondary | ICD-10-CM

## 2015-07-15 DIAGNOSIS — K819 Cholecystitis, unspecified: Secondary | ICD-10-CM | POA: Diagnosis present

## 2015-07-15 DIAGNOSIS — R072 Precordial pain: Secondary | ICD-10-CM | POA: Diagnosis present

## 2015-07-15 DIAGNOSIS — R109 Unspecified abdominal pain: Secondary | ICD-10-CM | POA: Diagnosis present

## 2015-07-15 DIAGNOSIS — I10 Essential (primary) hypertension: Secondary | ICD-10-CM | POA: Diagnosis present

## 2015-07-15 DIAGNOSIS — K5790 Diverticulosis of intestine, part unspecified, without perforation or abscess without bleeding: Secondary | ICD-10-CM | POA: Diagnosis present

## 2015-07-15 DIAGNOSIS — J302 Other seasonal allergic rhinitis: Secondary | ICD-10-CM | POA: Diagnosis present

## 2015-07-15 DIAGNOSIS — L570 Actinic keratosis: Secondary | ICD-10-CM | POA: Diagnosis present

## 2015-07-15 DIAGNOSIS — M722 Plantar fascial fibromatosis: Secondary | ICD-10-CM | POA: Diagnosis present

## 2015-07-15 DIAGNOSIS — E119 Type 2 diabetes mellitus without complications: Secondary | ICD-10-CM | POA: Diagnosis present

## 2015-07-15 DIAGNOSIS — E781 Pure hyperglyceridemia: Secondary | ICD-10-CM | POA: Diagnosis present

## 2015-07-15 DIAGNOSIS — Z885 Allergy status to narcotic agent status: Secondary | ICD-10-CM

## 2015-07-15 DIAGNOSIS — E7800 Pure hypercholesterolemia, unspecified: Secondary | ICD-10-CM | POA: Diagnosis present

## 2015-07-15 DIAGNOSIS — R1084 Generalized abdominal pain: Secondary | ICD-10-CM

## 2015-07-15 DIAGNOSIS — Z6830 Body mass index (BMI) 30.0-30.9, adult: Secondary | ICD-10-CM

## 2015-07-15 LAB — BASIC METABOLIC PANEL
Anion gap: 9 (ref 5–15)
BUN: 26 mg/dL — ABNORMAL HIGH (ref 6–20)
CO2: 20 mmol/L — ABNORMAL LOW (ref 22–32)
Calcium: 9.2 mg/dL (ref 8.9–10.3)
Chloride: 98 mmol/L — ABNORMAL LOW (ref 101–111)
Creatinine, Ser: 1.34 mg/dL — ABNORMAL HIGH (ref 0.61–1.24)
GFR calc Af Amer: >60 mL/min (ref >60)
GFR calc non Af Amer: 56 mL/min — ABNORMAL LOW (ref >60)
Glucose, Bld: 356 mg/dL — ABNORMAL HIGH (ref 65–99)
Potassium: 4.6 mmol/L (ref 3.5–5.1)
Sodium: 127 mmol/L — ABNORMAL LOW (ref 135–145)

## 2015-07-15 LAB — CBC
HCT: 41.7 % (ref 40.0–52.0)
Hemoglobin: 14.8 g/dL (ref 13.0–18.0)
MCH: 28.3 pg (ref 26.0–34.0)
MCHC: 35.5 g/dL (ref 32.0–36.0)
MCV: 81.7 fL (ref 80.0–100.0)
Platelets: 303 10*3/uL (ref 150–440)
RBC: 5.23 MIL/uL (ref 4.40–5.90)
RDW: 15.5 % — ABNORMAL HIGH (ref 11.5–14.5)
WBC: 12.8 10*3/uL — ABNORMAL HIGH (ref 3.8–10.6)

## 2015-07-15 LAB — TROPONIN I: Troponin I: <0.03 ng/mL (ref <0.031)

## 2015-07-15 IMAGING — CR DG CHEST 2V
1 series · 2 of 2 positions shown · non-contrast
Comparison: [DATE].

CLINICAL DATA: Right chest pain tonight.  Nonproductive cough.

EXAM:
CHEST  2 VIEW

[Series 1: dg chest 2 view · 0.14mm/px · 2 of 2 slices shown]
[im 1/2]
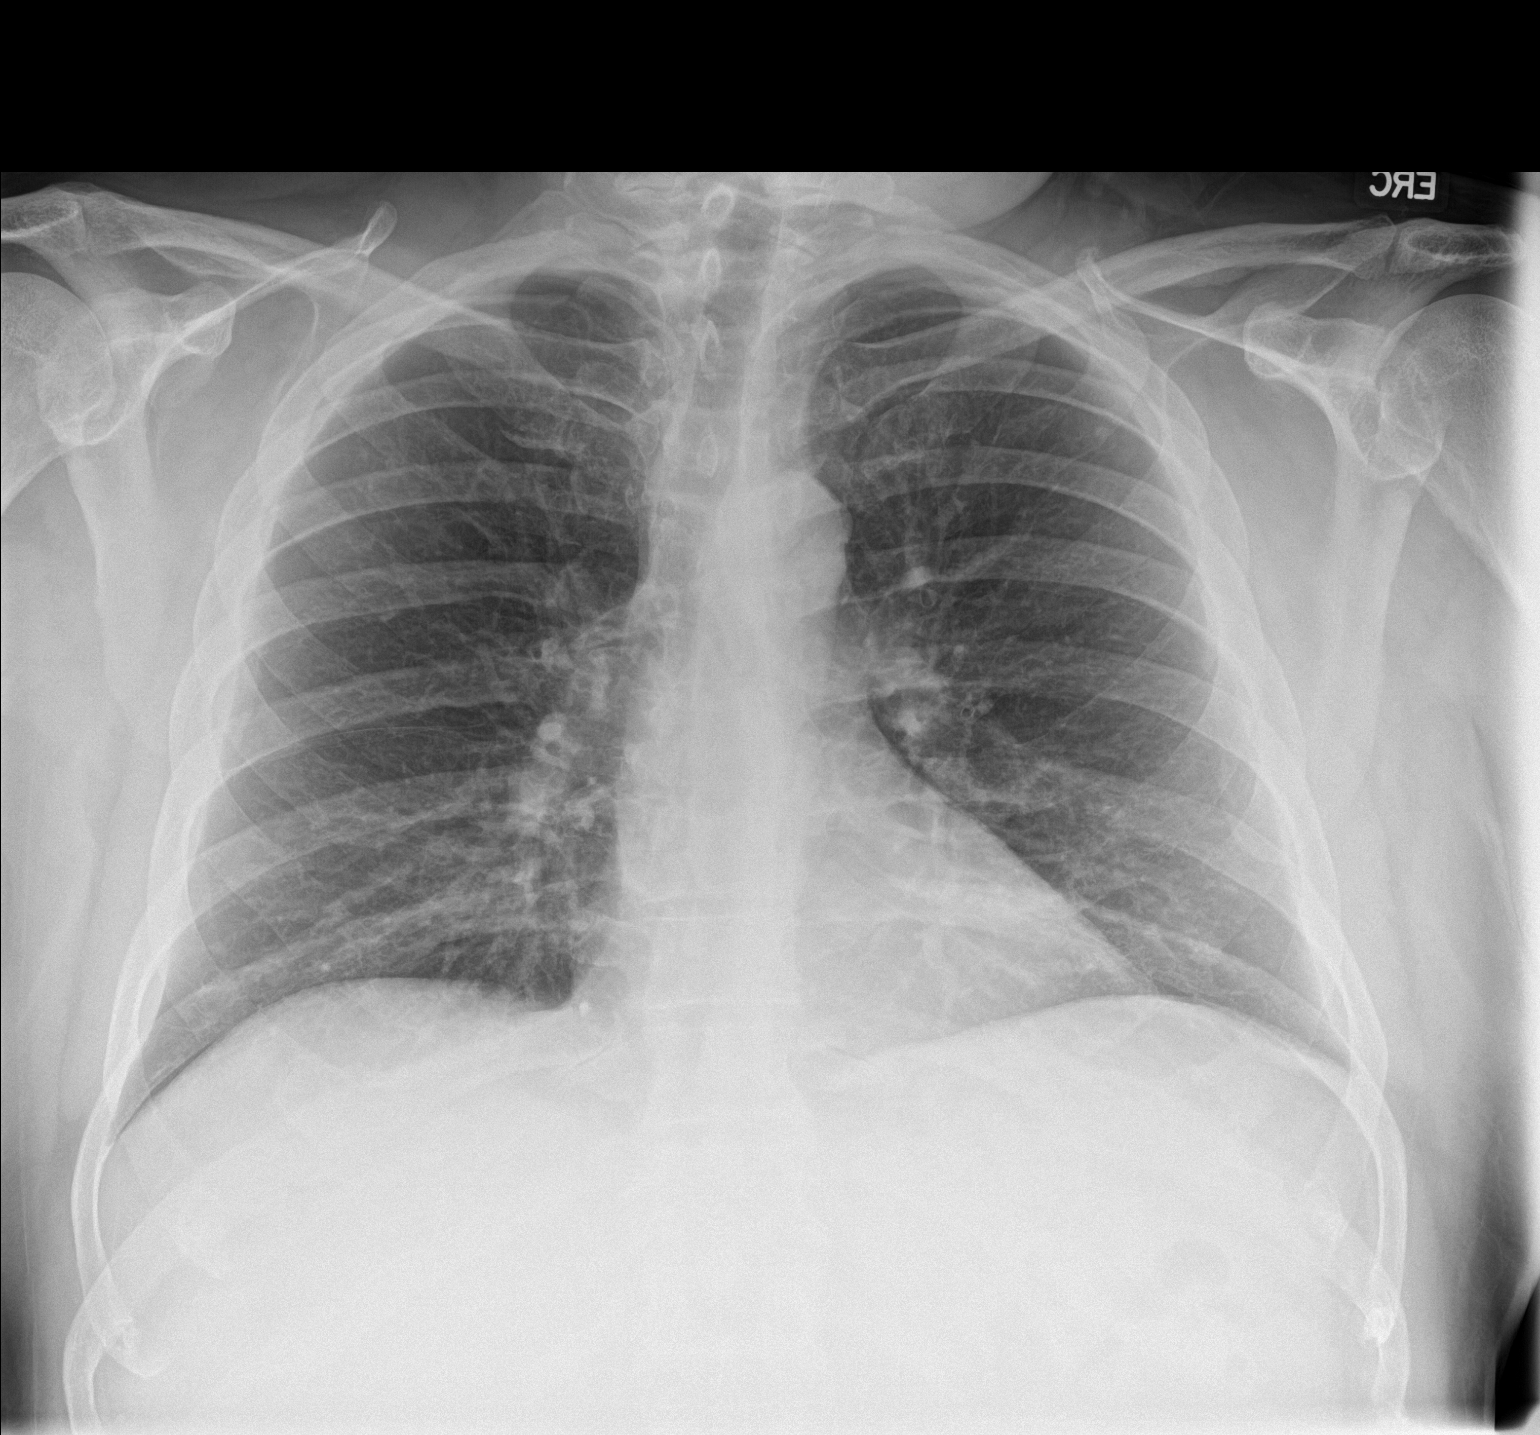
[im 2/2]
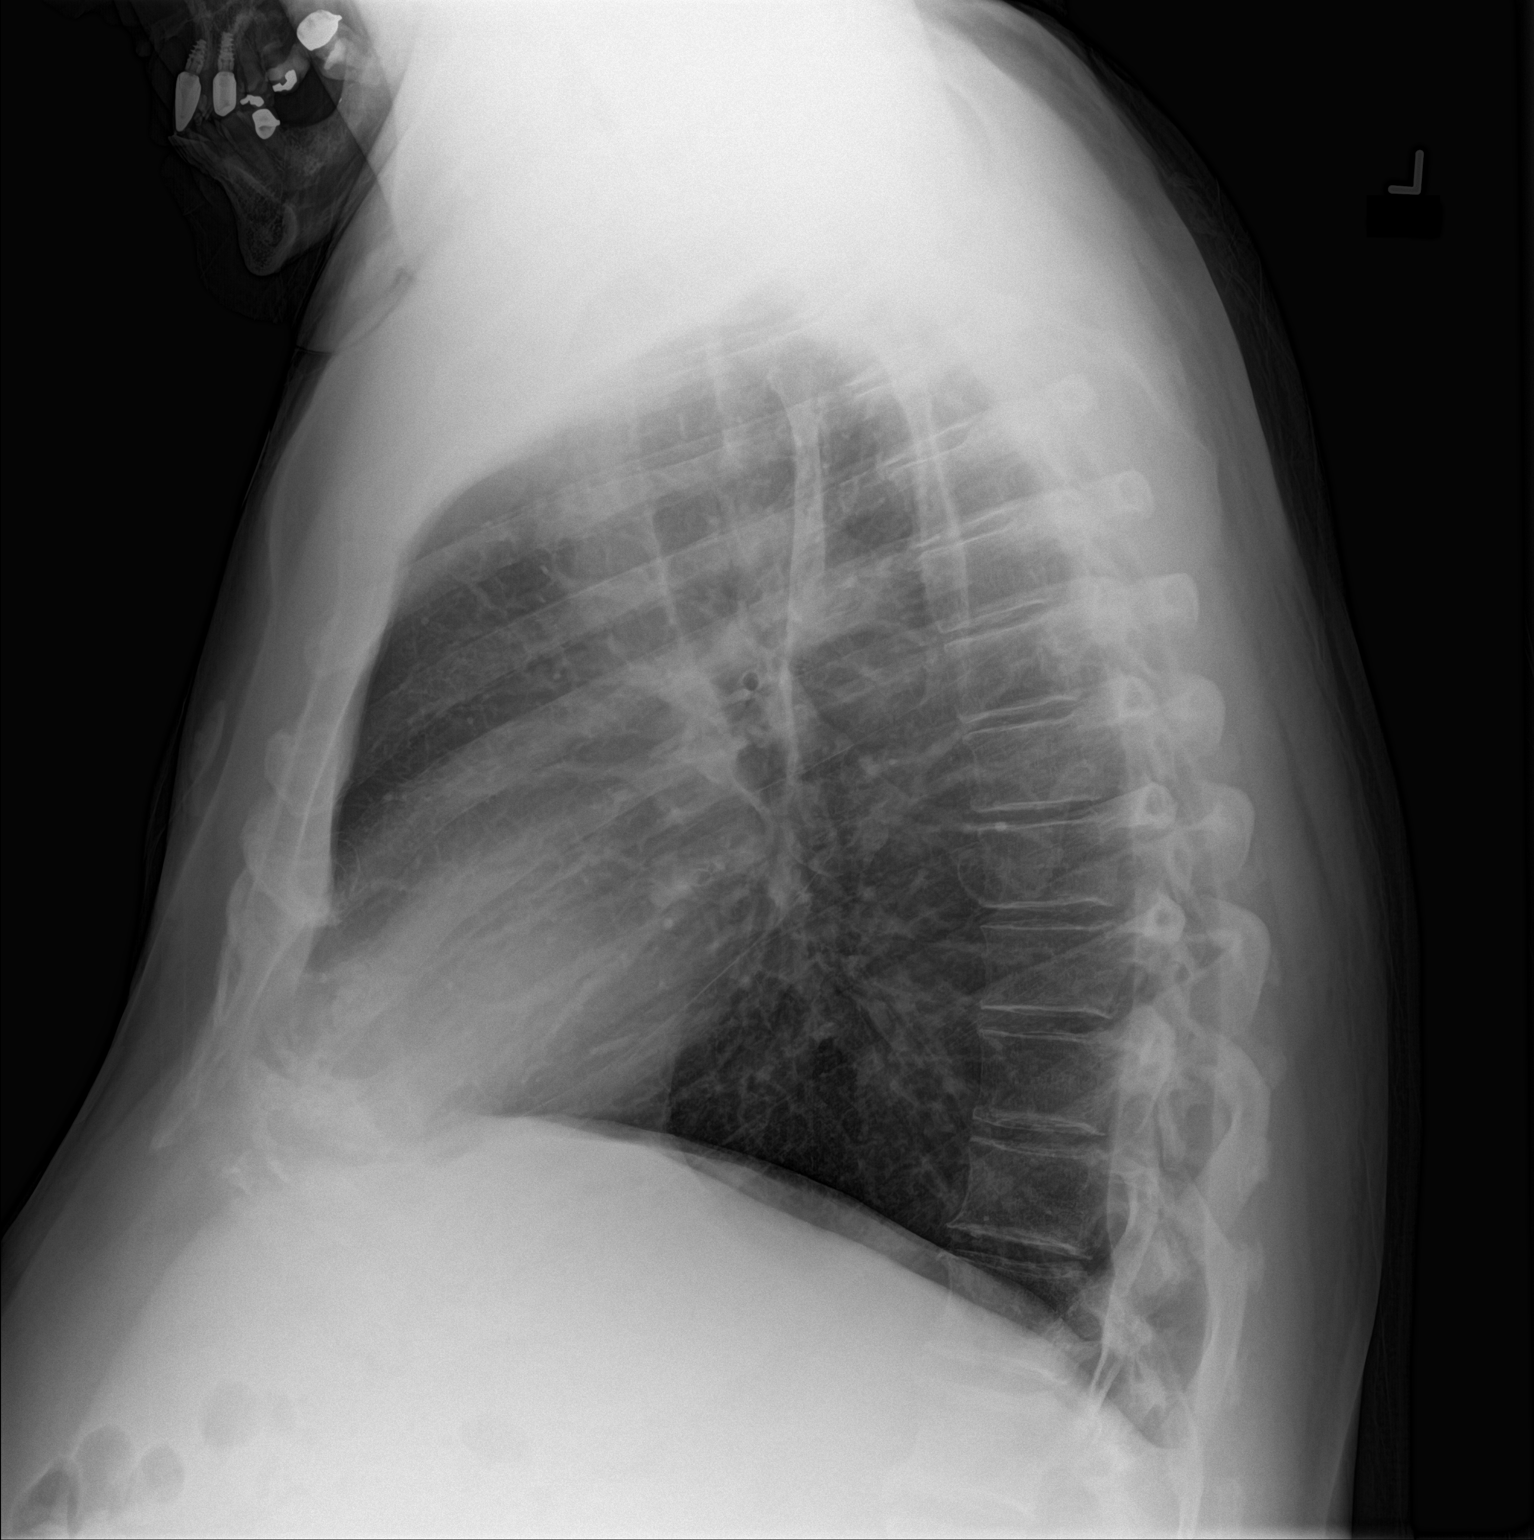

[2 of 2 positions shown; findings below may reference images not displayed]

FINDINGS: Normal sized heart. Clear lungs. Mild diffuse peribronchial
thickening. Flattening of the hemidiaphragms. Small left upper lobe
calcified granuloma or bone island. Minimal thoracic spine
degenerative changes.
IMPRESSION: 1. Mild bronchitic changes with mild progression.
2. Mild changes of COPD.

## 2015-07-15 IMAGING — CT CT CTA ABD/PEL W/CM AND/OR W/O CM
1 of 2 series · 15 of 32 positions shown, 19 images · IV contrast (APPLIED)
Comparison: None.

CLINICAL DATA: Central chest pain radiating to the back. Onset at
[DATE].

EXAM:
CT ANGIOGRAPHY CHEST, ABDOMEN AND PELVIS
TECHNIQUE: Multidetector CT imaging through the chest, abdomen and pelvis was
performed using the standard protocol during bolus administration of
intravenous contrast. Multiplanar reconstructed images and MIPs were
obtained and reviewed to evaluate the vascular anatomy.
CONTRAST:  100mL OMNIPAQUE IOHEXOL 350 MG/ML SOLN

[Series 6: cta chest · axial · 0.77mm/px · z∈[-721,-125]mm · 15 of 324 slices shown, 19 images]
[im 13/324  soft-tissue]
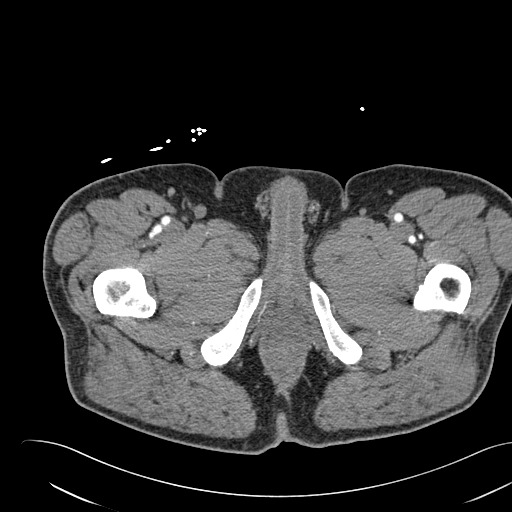
[im 13/324  bone]
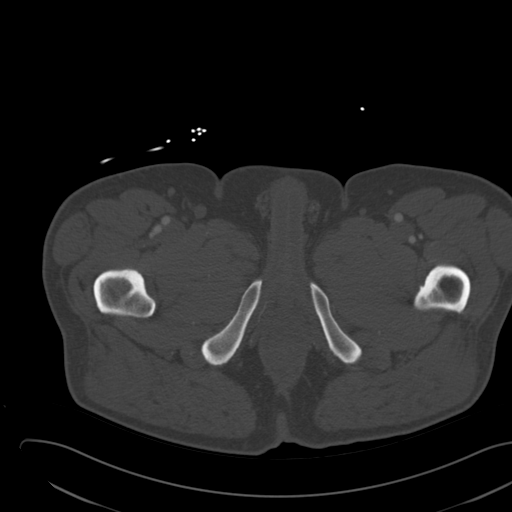
[im 38/324  soft-tissue]
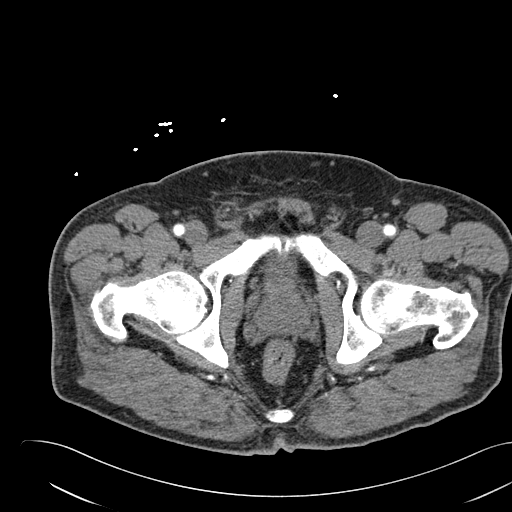
[im 63/324  soft-tissue]
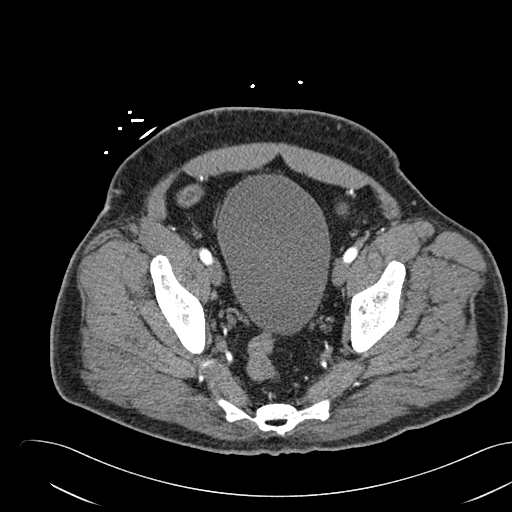
[im 87/324  soft-tissue]
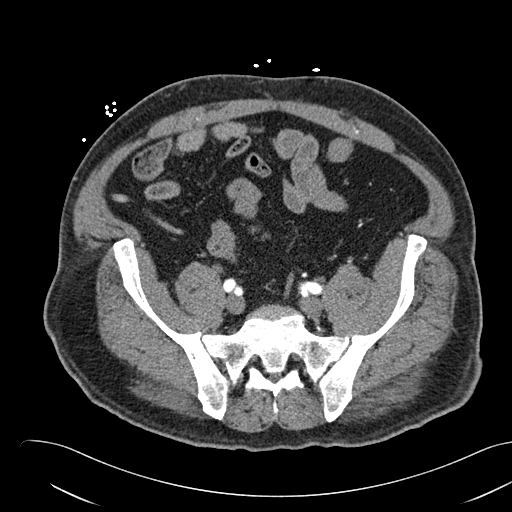
[im 112/324  soft-tissue]
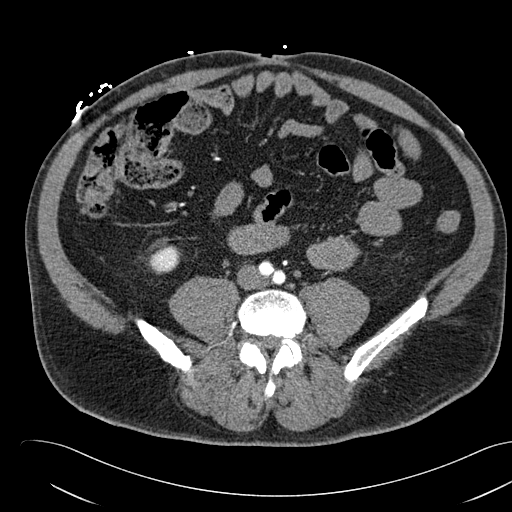
[im 137/324  soft-tissue]
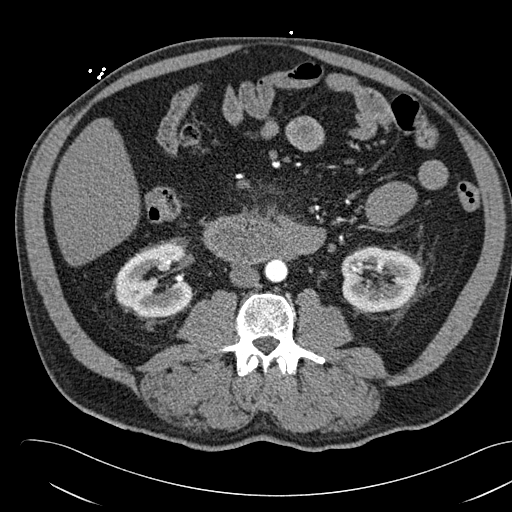
[im 162/324  soft-tissue]
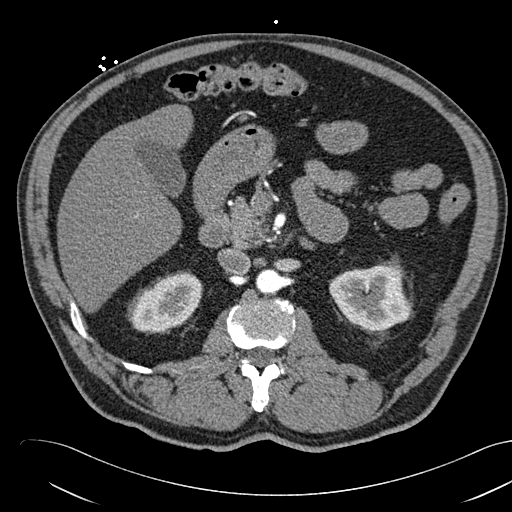
[im 187/324  soft-tissue]
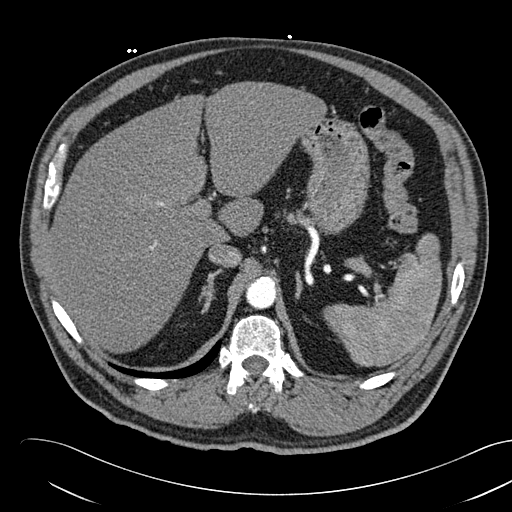
[im 212/324  soft-tissue]
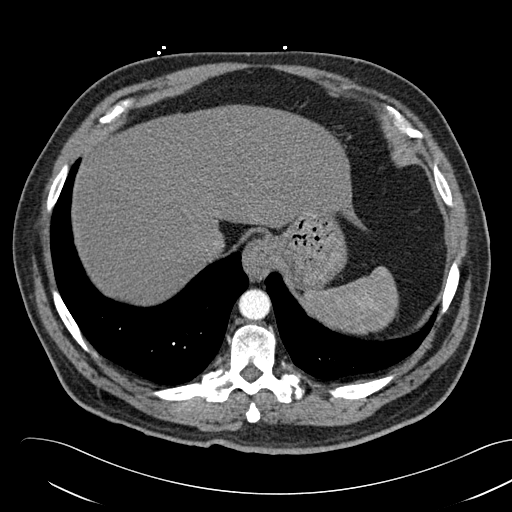
[im 212/324  bone]
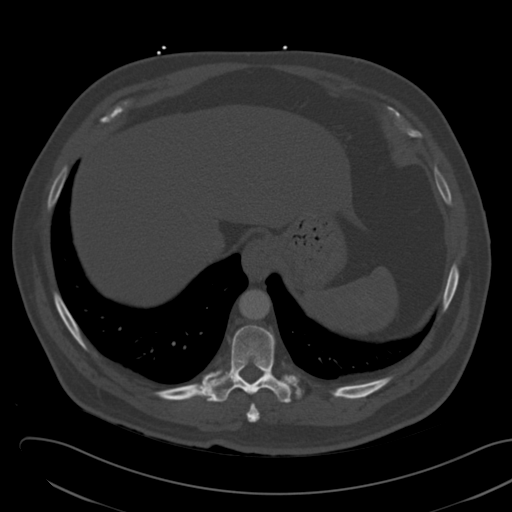
[im 237/324  soft-tissue]
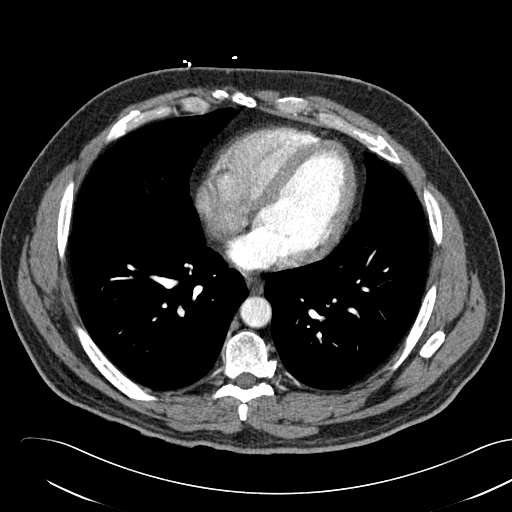
[im 261/324  soft-tissue]
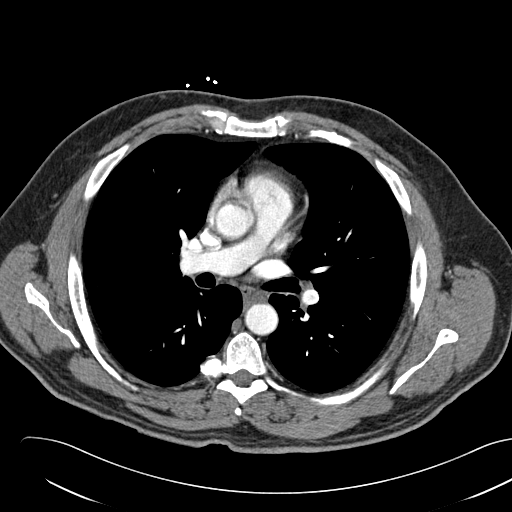
[im 274/324  lung]
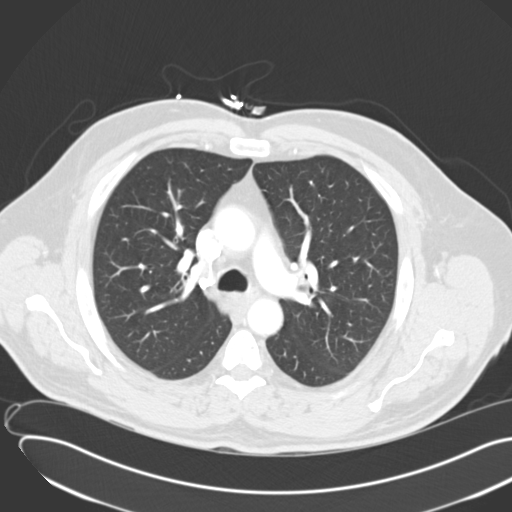
[im 286/324  soft-tissue]
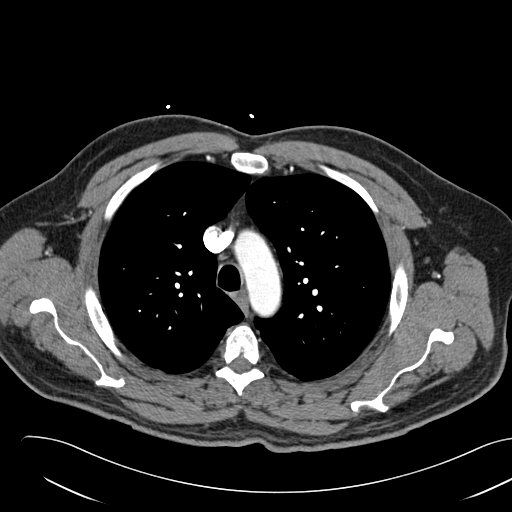
[im 286/324  lung]
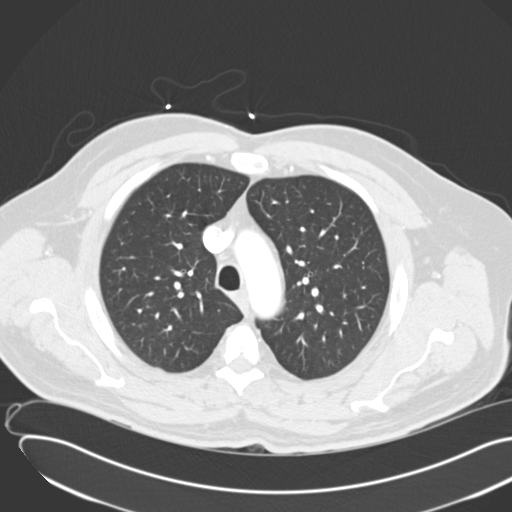
[im 299/324  lung]
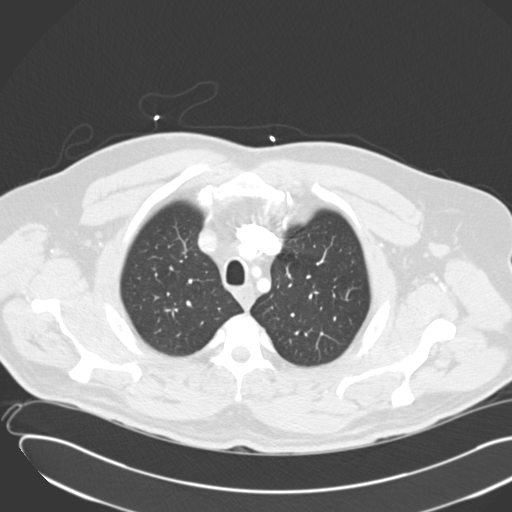
[im 311/324  soft-tissue]
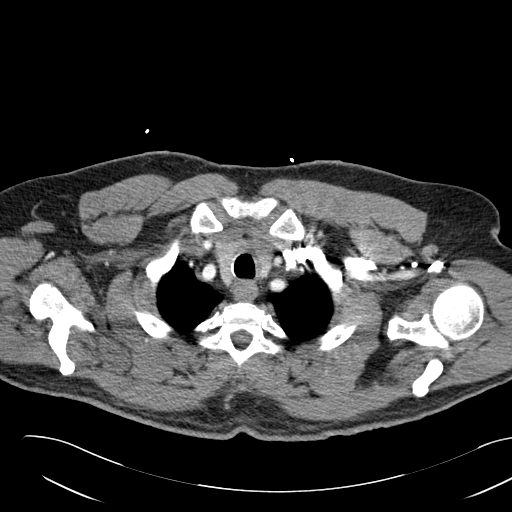
[im 311/324  lung]
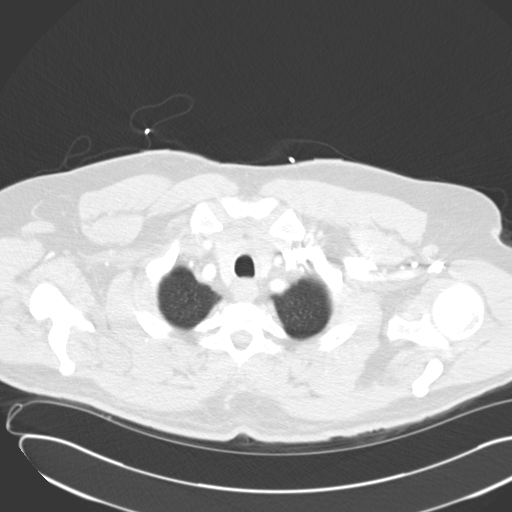

[15 of 32 positions shown; findings below may reference images not displayed]

FINDINGS: CTA CHEST FINDINGS

The thoracic aorta is normal in caliber. There is no dissection.
Great vessels are normal in caliber and are widely patent. Pulmonary
vasculature is well opacified with no evidence of pulmonary
embolism.

Review of the MIP images confirms the above findings.

Nonvascular findings in the chest: Clear lungs. Patent central
airways. No effusions.

CTA ABDOMEN AND PELVIS FINDINGS

The abdominal aorta is normal in caliber and widely patent. The
major branches of the aorta are widely patent. Common iliac and
external iliac arteries are widely patent.

Review of the MIP images confirms the above findings.

Nonvascular findings: There is a small hiatal hernia. Unremarkable
arterial phase appearances of the liver, spleen, adrenals and
kidneys. There is mild edema around the pancreatic uncinate process
and third portion of the duodenum; this may represent pancreatitis
or duodenitis. No fluid collections. Bowel is otherwise remarkable
only for uncomplicated moderate colonic diverticulosis.
IMPRESSION: 1. The thoracic and abdominal aorta is normal in caliber. There is
no dissection or aneurysm. The major branches are all patent and
normal in caliber.
2. No acute findings or significant abnormality in the chest.
3. Subtle edema around the pancreatic uncinate process and third
portion of the duodenum, raising the question of a mild degree of
pancreatitis or duodenitis.
4. Hiatal hernia.
5. Diverticulosis.

## 2015-07-15 MED ORDER — IOHEXOL 350 MG/ML SOLN
125.0000 mL | Freq: Once | INTRAVENOUS | Status: AC | PRN
  Administered 2015-07-15: 100 mL via INTRAVENOUS

## 2015-07-15 MED ORDER — NITROGLYCERIN 0.4 MG SL SUBL
0.4000 mg | SUBLINGUAL_TABLET | SUBLINGUAL | Status: DC | PRN
  Administered 2015-07-15 (×2): 0.4 mg via SUBLINGUAL

## 2015-07-15 MED ORDER — ASPIRIN 81 MG PO CHEW
324.0000 mg | CHEWABLE_TABLET | Freq: Once | ORAL | Status: AC
  Administered 2015-07-15: 324 mg via ORAL

## 2015-07-15 MED ORDER — SODIUM CHLORIDE 0.9 % IV BOLUS (SEPSIS)
1000.0000 mL | Freq: Once | INTRAVENOUS | Status: AC
  Administered 2015-07-15: 1000 mL via INTRAVENOUS

## 2015-07-15 MED ORDER — NITROGLYCERIN 0.4 MG SL SUBL
SUBLINGUAL_TABLET | SUBLINGUAL | Status: AC
  Administered 2015-07-15: 0.4 mg via SUBLINGUAL
  Filled 2015-07-15: qty 1

## 2015-07-15 MED ORDER — FENTANYL CITRATE (PF) 100 MCG/2ML IJ SOLN
50.0000 ug | Freq: Once | INTRAMUSCULAR | Status: AC
  Administered 2015-07-15: 50 ug via INTRAVENOUS

## 2015-07-15 MED ORDER — FENTANYL CITRATE (PF) 100 MCG/2ML IJ SOLN
50.0000 ug | Freq: Once | INTRAMUSCULAR | Status: AC
  Administered 2015-07-15: 50 ug via INTRAVENOUS
  Filled 2015-07-15: qty 2

## 2015-07-15 MED ORDER — ASPIRIN 81 MG PO CHEW
CHEWABLE_TABLET | ORAL | Status: AC
  Administered 2015-07-15: 324 mg via ORAL
  Filled 2015-07-15: qty 4

## 2015-07-15 MED ORDER — ONDANSETRON HCL 4 MG/2ML IJ SOLN
4.0000 mg | Freq: Once | INTRAMUSCULAR | Status: AC
  Administered 2015-07-15: 4 mg via INTRAVENOUS
  Filled 2015-07-15: qty 2

## 2015-07-15 NOTE — ED Notes (Signed)
Pt reports right sided chest pain radiating to right back x 4 hours.  PT denies hx chest pain.  Wife reports pt has increased thirst and chills.  Pain described as sharp.  Pt NAD at this time, respirations equal and unlabored, skin warm and dry.

## 2015-07-15 NOTE — ED Notes (Signed)
Pt reports increased central chest pain, rated 8/10.  MD to be informed.

## 2015-07-15 NOTE — ED Notes (Signed)
Pt taken to CT.

## 2015-07-15 NOTE — ED Notes (Signed)
Pt c/o midsternal chest pain radiating around to right upper back since 430 pm today. Denies SOB. +nausea at times. Denies vomiting. Denies diaphoresis.

## 2015-07-15 NOTE — ED Provider Notes (Signed)
[facility] Emergency Department Provider Note  ____________________________________________  Time seen: Approximately  PM  I have reviewed the triage vital signs and the nursing notes.   HISTORY  Chief Complaint Chest Pain    HPI Jeffrey [name] is a 59 y.o. male with a history of diabetes, hypertension and elevated cholesterol who is presenting tonight with chest pain and abdominal pain since 3 PM this afternoon. He says it is sharp stabbing pain to the right side of his chest rating directly through to his back. He says he is now having similar pain in his abdomen. He denies any associated symptoms such as vomiting, nausea, shortness of breath or diaphoresis. He denies any history of heart disease.   Past Medical History  Diagnosis Date  . Plantar fasciitis   . Cholecystitis   . Actinic keratosis   . Peyronie's disease   . Erectile dysfunction   . Diverticulosis   . Obesity   . Hyperlipidemia   . Diabetes mellitus ([place])   . Myalgia   . Seasonal allergies   . GERD (gastroesophageal reflux disease)   . Hypertension   . Kidney stones     Patient Active Problem List   Diagnosis Date Noted  . ED (erectile dysfunction) of organic origin 04/18/2015  . Hypertension 12/06/2014  . Seasonal allergies 12/06/2014  . Cholecystitis 12/06/2014  . Diverticulosis 12/06/2014  . GERD (gastroesophageal reflux disease) 12/06/2014  . Diabetes mellitus ([place]) 12/06/2014  . Plantar fasciitis 12/06/2014  . Actinic keratosis 12/06/2014  . Peyronie's disease 12/06/2014  . Erectile dysfunction 12/06/2014  . Obesity 12/06/2014  . Hyperlipidemia 12/06/2014  . Myalgia 12/06/2014    Past Surgical History  Procedure Laterality Date  . Meniscus repair    . Arthroscopic repair acl    . Hernia repair    . Lithotripsy      Current Outpatient Rx  Name  Route  Sig  Dispense  Refill  . amLODipine (NORVASC) 10 MG tablet      Take 1 tablet by mouth  daily   90  tablet   3   . aspirin 81 MG tablet   Oral   Take 81 mg by mouth daily.         . Canagliflozin-Metformin HCl [phone] MG TABS   Oral   Take 1 tablet by mouth daily.         . fenofibrate 160 MG tablet      Take 1 tablet by mouth  daily   90 tablet   2   . fluticasone (FLONASE) 50 MCG/ACT nasal spray   Each Nare   Place 1 spray into both nostrils daily.   16 g   12   . gabapentin (NEURONTIN) 100 MG capsule   Oral   Take 200 mg by mouth every morning.         Marland Kitchen glucose blood test strip      Check sugar once daily   100 each   3     NEEDS ONE TOUCH VERIO STRIP> QS 90 day supply, DX: ...   . Lancet Devices (ACCU-CHEK SOFTCLIX) lancets      Check sugar once daily   3 each   3     NEEDS LANCET SOFTCLIX: DX E:11.9, QS 90 day supply   . loratadine (CLARITIN) 10 MG tablet   Oral   Take 10 mg by mouth daily.         Marland Kitchen losartan (COZAAR) 100 MG tablet  Take 1 tablet by mouth  daily   90 tablet   3   . omeprazole (PRILOSEC) 20 MG capsule   Oral   Take 20 mg by mouth daily.         . sildenafil (VIAGRA) 100 MG tablet   Oral   Take 100 mg by mouth daily as needed for erectile dysfunction.         . traMADol (ULTRAM) 50 MG tablet   Oral   Take by mouth every 6 (six) hours as needed.           Allergies Codeine  Family History  Problem Relation Age of Onset  . Hypertension Father   . Heart attack Father   . Drug abuse Sister     Secondary to head injury after MVA  . Club foot Son     Bilat  . Cancer Maternal Grandmother   . Diabetes Maternal Grandfather   . Cancer Paternal Grandmother   . Hypertension Paternal Grandfather   . Heart attack Paternal Grandfather   . Heart disease Brother   . Heart disease Brother     Social History Social History  Substance Use Topics  . Smoking status: Former Smoker -- 1.50 packs/day    Types: Cigarettes    Quit date: 08/11/1984  . Smokeless tobacco: Never Used  . Alcohol Use: No     Review of Systems Constitutional: No fever/chills Eyes: No visual changes. ENT: No sore throat. Cardiovascular: As above  Respiratory: Denies shortness of breath. Gastrointestinal:  No nausea, no vomiting.  No diarrhea.  No constipation. Genitourinary: Negative for dysuria. Musculoskeletal: Negative for back pain. Skin: Negative for rash. Neurological: Negative for headaches, focal weakness or numbness.  10-point ROS otherwise negative.  ____________________________________________   PHYSICAL EXAM:  VITAL SIGNS: ED Triage Vitals  Enc Vitals Group     BP 07/15/15 2008 161/77 mmHg     Pulse Rate 07/15/15 2008 103     Resp 07/15/15 2008 20     Temp 07/15/15 2008 98.9 F (37.2 C)     Temp Source 07/15/15 2008 Oral     SpO2 07/15/15 2008 96 %     Weight 07/15/15 2008 203 lb (92.08 kg)     Height 07/15/15 2008 5\' 8"  (1.727 m)     Head Cir --      Peak Flow --      Pain Score 07/15/15 2008 8     Pain Loc --      Pain Edu? --      Excl. in [place]? --     Constitutional: Alert and oriented. Well appearing and in no acute distress. Eyes: Conjunctivae are normal. PERRL. EOMI. Head: Atraumatic. Nose: No congestion/rhinnorhea. Mouth/Throat: Mucous membranes are moist.  Oropharynx non-erythematous. Neck: No stridor.   Cardiovascular: Normal rate, regular rhythm. Grossly normal heart sounds.  Good peripheral circulation. Equal and bilateral radial as well as dorsalis pedis pulses. Respiratory: Normal respiratory effort.  No retractions. Lungs CTAB. Gastrointestinal: Soft with tenderness to the upper abdomen as well as the right lower quadrant. No distention. No abdominal bruits. No CVA tenderness. Musculoskeletal: No lower extremity tenderness nor edema.  No joint effusions. Neurologic:  Normal speech and language. No gross focal neurologic deficits are appreciated. No gait instability. Skin:  Skin is warm, dry and intact. No rash noted. Psychiatric: Mood and affect are normal.  Speech and behavior are normal.  ____________________________________________   LABS (all labs ordered are listed, but only abnormal results are displayed)  Labs Reviewed  BASIC METABOLIC PANEL - Abnormal; Notable for the following:    Sodium 127 (*)    Chloride 98 (*)    CO2 20 (*)    Glucose, Bld 356 (*)    BUN 26 (*)    Creatinine, Ser 1.34 (*)    GFR calc non Af Amer 56 (*)    All other components within normal limits  CBC - Abnormal; Notable for the following:    WBC 12.8 (*)    RDW 15.5 (*)    All other components within normal limits  TROPONIN I  HEPATIC FUNCTION PANEL  LIPASE, BLOOD   ____________________________________________  EKG  ED ECG REPORT I, Doran Stabler, the attending physician, personally viewed and interpreted this ECG.   Date: 07/15/2015  EKG Time: 20 00  Rate: 100  Rhythm: normal sinus rhythm  Axis: Normal axis  Intervals:right bundle branch block  ST&T Change: No ST segment elevation or depression. No abnormal T-wave inversion. Similar appearance to May 2009 EKG. ____________________________________________  [id number]  Thoracic and abdominal aorta are normal in caliber. No acute findings in the chest. So edema of the pancreatic uncinate process at their portion of the duodenum raising question of a mild degree of peritonitis or duodenitis. Chest x-ray with mild changes of COPD or bronchitis. ____________________________________________   PROCEDURES    ____________________________________________   INITIAL IMPRESSION / ASSESSMENT AND PLAN / ED COURSE  Pertinent labs & imaging results that were available during my care of the patient were reviewed by me and considered in my medical decision making (see chart for details). ----------------------------------------- 11:54 PM on 07/15/2015 -----------------------------------------   Patient's pain is not relieved by nitroglycerin. Has had 2 IV doses of pain medication with mild  to moderate relief. Pending lab results at this time. Signed out to Dr. Marcelene Butte. ____________________________________________   FINAL CLINICAL IMPRESSION(S) / ED DIAGNOSES  Final diagnoses:  Chest pain   abdominal pain  Orbie Pyo, MD 07/15/15 [id number]

## 2015-07-16 ENCOUNTER — Inpatient Hospital Stay: Payer: 59

## 2015-07-16 DIAGNOSIS — M791 Myalgia: Secondary | ICD-10-CM | POA: Diagnosis present

## 2015-07-16 DIAGNOSIS — Z79899 Other long term (current) drug therapy: Secondary | ICD-10-CM | POA: Diagnosis not present

## 2015-07-16 DIAGNOSIS — K859 Acute pancreatitis without necrosis or infection, unspecified: Secondary | ICD-10-CM | POA: Diagnosis present

## 2015-07-16 DIAGNOSIS — K219 Gastro-esophageal reflux disease without esophagitis: Secondary | ICD-10-CM | POA: Diagnosis present

## 2015-07-16 DIAGNOSIS — Z87891 Personal history of nicotine dependence: Secondary | ICD-10-CM | POA: Diagnosis not present

## 2015-07-16 DIAGNOSIS — M722 Plantar fascial fibromatosis: Secondary | ICD-10-CM | POA: Diagnosis present

## 2015-07-16 DIAGNOSIS — R109 Unspecified abdominal pain: Secondary | ICD-10-CM | POA: Diagnosis present

## 2015-07-16 DIAGNOSIS — E781 Pure hyperglyceridemia: Secondary | ICD-10-CM | POA: Diagnosis present

## 2015-07-16 DIAGNOSIS — I10 Essential (primary) hypertension: Secondary | ICD-10-CM | POA: Diagnosis present

## 2015-07-16 DIAGNOSIS — Z6830 Body mass index (BMI) 30.0-30.9, adult: Secondary | ICD-10-CM | POA: Diagnosis not present

## 2015-07-16 DIAGNOSIS — E119 Type 2 diabetes mellitus without complications: Secondary | ICD-10-CM | POA: Diagnosis present

## 2015-07-16 DIAGNOSIS — Z885 Allergy status to narcotic agent status: Secondary | ICD-10-CM | POA: Diagnosis not present

## 2015-07-16 DIAGNOSIS — L570 Actinic keratosis: Secondary | ICD-10-CM | POA: Diagnosis present

## 2015-07-16 DIAGNOSIS — E7800 Pure hypercholesterolemia, unspecified: Secondary | ICD-10-CM | POA: Diagnosis present

## 2015-07-16 DIAGNOSIS — R072 Precordial pain: Secondary | ICD-10-CM | POA: Diagnosis present

## 2015-07-16 DIAGNOSIS — N529 Male erectile dysfunction, unspecified: Secondary | ICD-10-CM | POA: Diagnosis present

## 2015-07-16 DIAGNOSIS — Z87442 Personal history of urinary calculi: Secondary | ICD-10-CM | POA: Diagnosis not present

## 2015-07-16 DIAGNOSIS — K819 Cholecystitis, unspecified: Secondary | ICD-10-CM | POA: Diagnosis present

## 2015-07-16 DIAGNOSIS — I4510 Unspecified right bundle-branch block: Secondary | ICD-10-CM | POA: Diagnosis present

## 2015-07-16 DIAGNOSIS — K5790 Diverticulosis of intestine, part unspecified, without perforation or abscess without bleeding: Secondary | ICD-10-CM | POA: Diagnosis present

## 2015-07-16 DIAGNOSIS — J302 Other seasonal allergic rhinitis: Secondary | ICD-10-CM | POA: Diagnosis present

## 2015-07-16 DIAGNOSIS — E669 Obesity, unspecified: Secondary | ICD-10-CM | POA: Diagnosis present

## 2015-07-16 LAB — HEPATIC FUNCTION PANEL
ALT: UNDETERMINED U/L (ref 17–63)
ALT: UNDETERMINED U/L (ref 17–63)
AST: UNDETERMINED U/L (ref 15–41)
AST: UNDETERMINED U/L (ref 15–41)
Albumin: 4.1 g/dL (ref 3.5–5.0)
Albumin: 3.8 g/dL (ref 3.5–5.0)
Alkaline Phosphatase: 72 U/L (ref 38–126)
Alkaline Phosphatase: 46 U/L (ref 38–126)
Bilirubin, Direct: <0.1 mg/dL — ABNORMAL LOW (ref 0.1–0.5)
Bilirubin, Direct: 0.8 mg/dL — ABNORMAL HIGH (ref 0.1–0.5)
Indirect Bilirubin: 1.2 mg/dL — ABNORMAL HIGH (ref 0.3–0.9)
Total Bilirubin: <0.1 mg/dL — ABNORMAL LOW (ref 0.3–1.2)
Total Bilirubin: 2 mg/dL — ABNORMAL HIGH (ref 0.3–1.2)
Total Protein: 5.5 g/dL — ABNORMAL LOW (ref 6.5–8.1)
Total Protein: 5.5 g/dL — ABNORMAL LOW (ref 6.5–8.1)

## 2015-07-16 LAB — URINALYSIS COMPLETE WITH MICROSCOPIC (ARMC ONLY)
Bacteria, UA: NONE SEEN
Bilirubin Urine: NEGATIVE
Glucose, UA: >500 mg/dL — AB
Hgb urine dipstick: NEGATIVE
Leukocytes, UA: NEGATIVE
Nitrite: NEGATIVE
Protein, ur: NEGATIVE mg/dL
Specific Gravity, Urine: 1.038 — ABNORMAL HIGH (ref 1.005–1.030)
Squamous Epithelial / LPF: NONE SEEN
WBC, UA: NONE SEEN WBC/hpf (ref 0–5)
pH: 5 (ref 5.0–8.0)

## 2015-07-16 LAB — LIPID PANEL
Cholesterol: 361 mg/dL — ABNORMAL HIGH (ref 0–200)
HDL: UNDETERMINED mg/dL (ref >40)
LDL Cholesterol: UNDETERMINED mg/dL (ref 0–99)
Total CHOL/HDL Ratio: UNDETERMINED RATIO
Triglycerides: UNDETERMINED mg/dL (ref <150)
VLDL: UNDETERMINED mg/dL (ref 0–40)

## 2015-07-16 LAB — MRSA PCR SCREENING: MRSA by PCR: NEGATIVE

## 2015-07-16 LAB — GLUCOSE, CAPILLARY
Glucose-Capillary: 106 mg/dL — ABNORMAL HIGH (ref 65–99)
Glucose-Capillary: 123 mg/dL — ABNORMAL HIGH (ref 65–99)
Glucose-Capillary: 127 mg/dL — ABNORMAL HIGH (ref 65–99)
Glucose-Capillary: 128 mg/dL — ABNORMAL HIGH (ref 65–99)
Glucose-Capillary: 130 mg/dL — ABNORMAL HIGH (ref 65–99)
Glucose-Capillary: 135 mg/dL — ABNORMAL HIGH (ref 65–99)
Glucose-Capillary: 139 mg/dL — ABNORMAL HIGH (ref 65–99)
Glucose-Capillary: 140 mg/dL — ABNORMAL HIGH (ref 65–99)
Glucose-Capillary: 144 mg/dL — ABNORMAL HIGH (ref 65–99)
Glucose-Capillary: 146 mg/dL — ABNORMAL HIGH (ref 65–99)
Glucose-Capillary: 146 mg/dL — ABNORMAL HIGH (ref 65–99)
Glucose-Capillary: 156 mg/dL — ABNORMAL HIGH (ref 65–99)
Glucose-Capillary: 231 mg/dL — ABNORMAL HIGH (ref 65–99)
Glucose-Capillary: 88 mg/dL (ref 65–99)

## 2015-07-16 LAB — TSH: TSH: 1.918 u[IU]/mL (ref 0.350–4.500)

## 2015-07-16 LAB — HEMOGLOBIN A1C: Hgb A1c MFr Bld: 9.5 % — ABNORMAL HIGH (ref 4.0–6.0)

## 2015-07-16 LAB — LIPASE, BLOOD: Lipase: 144 U/L — ABNORMAL HIGH (ref 11–51)

## 2015-07-16 LAB — ETHANOL: Alcohol, Ethyl (B): UNDETERMINED mg/dL (ref <5)

## 2015-07-16 MED ORDER — INSULIN ASPART 100 UNIT/ML ~~LOC~~ SOLN
0.0000 [IU] | Freq: Three times a day (TID) | SUBCUTANEOUS | Status: DC
  Administered 2015-07-16: 5 [IU] via SUBCUTANEOUS
  Filled 2015-07-16: qty 5

## 2015-07-16 MED ORDER — DEXTROSE 5 % IV SOLN
2.0000 g | INTRAVENOUS | Status: DC
  Administered 2015-07-16 – 2015-07-19 (×5): 2 g via INTRAVENOUS
  Filled 2015-07-16 (×7): qty 2

## 2015-07-16 MED ORDER — FENOFIBRATE 54 MG PO TABS
54.0000 mg | ORAL_TABLET | Freq: Every day | ORAL | Status: DC
  Administered 2015-07-16 – 2015-07-18 (×3): 54 mg via ORAL
  Filled 2015-07-16 (×4): qty 1

## 2015-07-16 MED ORDER — LOSARTAN POTASSIUM 50 MG PO TABS
100.0000 mg | ORAL_TABLET | Freq: Every day | ORAL | Status: DC
  Administered 2015-07-16 – 2015-07-19 (×4): 100 mg via ORAL
  Filled 2015-07-16 (×4): qty 2

## 2015-07-16 MED ORDER — SODIUM CHLORIDE 0.9 % IV SOLN
0.0500 [IU]/kg/h | INTRAVENOUS | Status: DC
  Administered 2015-07-16: 0.1 [IU]/kg/h via INTRAVENOUS
  Administered 2015-07-18: 0.05 [IU]/kg/h via INTRAVENOUS
  Filled 2015-07-16 (×3): qty 2.5

## 2015-07-16 MED ORDER — DEXTROSE 5 % IV SOLN
INTRAVENOUS | Status: DC
  Administered 2015-07-16: 12:00:00 via INTRAVENOUS
  Administered 2015-07-16: 225 mL via INTRAVENOUS
  Administered 2015-07-17 (×2): via INTRAVENOUS
  Administered 2015-07-17: 225 mL via INTRAVENOUS
  Administered 2015-07-17 (×2): via INTRAVENOUS
  Administered 2015-07-17: 275 mL via INTRAVENOUS
  Administered 2015-07-18 (×2): via INTRAVENOUS

## 2015-07-16 MED ORDER — HYDROMORPHONE HCL 1 MG/ML IJ SOLN
0.5000 mg | INTRAMUSCULAR | Status: DC | PRN
  Administered 2015-07-16 (×5): 0.5 mg via INTRAVENOUS
  Filled 2015-07-16 (×5): qty 1

## 2015-07-16 MED ORDER — SODIUM CHLORIDE 0.9 % IJ SOLN
3.0000 mL | Freq: Two times a day (BID) | INTRAMUSCULAR | Status: DC
  Administered 2015-07-16 – 2015-07-19 (×4): 3 mL via INTRAVENOUS

## 2015-07-16 MED ORDER — ONDANSETRON HCL 4 MG/2ML IJ SOLN
4.0000 mg | Freq: Once | INTRAMUSCULAR | Status: AC
  Administered 2015-07-16: 4 mg via INTRAVENOUS
  Filled 2015-07-16: qty 2

## 2015-07-16 MED ORDER — LORATADINE 10 MG PO TABS
10.0000 mg | ORAL_TABLET | Freq: Every day | ORAL | Status: DC
  Administered 2015-07-16 – 2015-07-19 (×4): 10 mg via ORAL
  Filled 2015-07-16 (×4): qty 1

## 2015-07-16 MED ORDER — HEPARIN SODIUM (PORCINE) 5000 UNIT/ML IJ SOLN
5000.0000 [IU] | Freq: Three times a day (TID) | INTRAMUSCULAR | Status: DC
  Administered 2015-07-16 – 2015-07-19 (×10): 5000 [IU] via SUBCUTANEOUS
  Filled 2015-07-16 (×9): qty 1

## 2015-07-16 MED ORDER — HYDROMORPHONE HCL 1 MG/ML IJ SOLN
1.0000 mg | Freq: Once | INTRAMUSCULAR | Status: AC
  Administered 2015-07-16: 1 mg via INTRAVENOUS
  Filled 2015-07-16: qty 1

## 2015-07-16 MED ORDER — DIPHENHYDRAMINE HCL 50 MG/ML IJ SOLN
12.5000 mg | Freq: Four times a day (QID) | INTRAMUSCULAR | Status: DC | PRN
  Filled 2015-07-16: qty 0.25

## 2015-07-16 MED ORDER — GABAPENTIN 100 MG PO CAPS
200.0000 mg | ORAL_CAPSULE | ORAL | Status: DC
  Administered 2015-07-16 – 2015-07-19 (×4): 200 mg via ORAL
  Filled 2015-07-16 (×4): qty 2

## 2015-07-16 MED ORDER — ONDANSETRON HCL 4 MG PO TABS
4.0000 mg | ORAL_TABLET | Freq: Four times a day (QID) | ORAL | Status: DC | PRN
Start: 2015-07-16 — End: 2015-07-19

## 2015-07-16 MED ORDER — SODIUM CHLORIDE 0.9 % IV SOLN: INTRAVENOUS | Status: DC

## 2015-07-16 MED ORDER — ONDANSETRON HCL 4 MG/2ML IJ SOLN
4.0000 mg | Freq: Four times a day (QID) | INTRAMUSCULAR | Status: DC | PRN
  Administered 2015-07-16: 4 mg via INTRAVENOUS
  Filled 2015-07-16: qty 2

## 2015-07-16 MED ORDER — DOCUSATE SODIUM 100 MG PO CAPS
100.0000 mg | ORAL_CAPSULE | Freq: Two times a day (BID) | ORAL | Status: DC
  Administered 2015-07-16 – 2015-07-18 (×5): 100 mg via ORAL
  Filled 2015-07-16 (×7): qty 1

## 2015-07-16 MED ORDER — AMLODIPINE BESYLATE 10 MG PO TABS
10.0000 mg | ORAL_TABLET | Freq: Every day | ORAL | Status: DC
  Administered 2015-07-16 – 2015-07-19 (×4): 10 mg via ORAL
  Filled 2015-07-16 (×4): qty 1

## 2015-07-16 MED ORDER — ASPIRIN 81 MG PO CHEW
81.0000 mg | CHEWABLE_TABLET | Freq: Every day | ORAL | Status: DC
  Administered 2015-07-16 – 2015-07-19 (×4): 81 mg via ORAL
  Filled 2015-07-16 (×4): qty 1

## 2015-07-16 MED ORDER — PANTOPRAZOLE SODIUM 40 MG PO TBEC
40.0000 mg | DELAYED_RELEASE_TABLET | Freq: Every day | ORAL | Status: DC
  Administered 2015-07-16 – 2015-07-19 (×4): 40 mg via ORAL
  Filled 2015-07-16 (×4): qty 1

## 2015-07-16 MED ORDER — SODIUM CHLORIDE 0.9 % IV SOLN
INTRAVENOUS | Status: DC
  Administered 2015-07-16: 04:00:00 via INTRAVENOUS

## 2015-07-16 MED ORDER — INSULIN ASPART 100 UNIT/ML ~~LOC~~ SOLN: 0.0000 [IU] | Freq: Every day | SUBCUTANEOUS | Status: DC

## 2015-07-16 MED ORDER — FLUTICASONE PROPIONATE 50 MCG/ACT NA SUSP
1.0000 | Freq: Every day | NASAL | Status: DC
  Filled 2015-07-16: qty 16

## 2015-07-16 NOTE — ED Provider Notes (Signed)
-----------------------------------------   1:01 AM on 07/16/2015 -----------------------------------------   Blood pressure 143/83, pulse 92, temperature 98.9 F (37.2 C), temperature source Oral, resp. rate 16, height 5\' 8"  (1.727 m), weight 203 lb (92.08 kg), SpO2 95 %.  Assuming care from Dr. Thom Chimes.  In short, Jeffrey [name] is a 59 y.o. male with a chief complaint of Chest Pain .  Refer to the original H&P for additional details.  The current plan of care is to is to follow patient's lipase which is elevated. Patient has received Dilaudid IV and series and in asking the patient whether or not he would like to be treated on a inpatient versus outpatient basis patient elected inpatient stay for further workup and pain control.Daymon Larsen, MD 07/16/15 [phone]

## 2015-07-16 NOTE — Progress Notes (Addendum)
Blood alcohol level cannot be calculated due to high lipids. Hypercholesterolemia may be etiology of pancreatitis. Check lipid panel. If grossly elevated patient may need insulin drip

## 2015-07-16 NOTE — H&P (Addendum)
Jeffrey [name] is an 59 y.o. male.   Chief Complaint: Abdominal pain  HPI: The patient presents to the emergency department complaining of abdominal pain and nausea that began earlier this afternoon. He states the pain is located in his upper abdomen and radiates to his right chest into his right shoulder blade. He denies associated shortness of breath. Emesis has been nonbloody and nonbilious denies fever as well. In the emergency department the patient does not have an elevated lipase which prompted emergency [facility] staff to call for admission.  Past Medical History  Diagnosis Date  . Plantar fasciitis   . Cholecystitis   . Actinic keratosis   . Peyronie's disease   . Erectile dysfunction   . Diverticulosis   . Obesity   . Hyperlipidemia   . Diabetes mellitus ([place])   . Myalgia   . Seasonal allergies   . GERD (gastroesophageal reflux disease)   . Hypertension   . Kidney stones     Past Surgical History  Procedure Laterality Date  . Meniscus repair    . Arthroscopic repair acl    . Hernia repair    . Lithotripsy      Family History  Problem Relation Age of Onset  . Hypertension Father   . Heart attack Father   . Drug abuse Sister     Secondary to head injury after MVA  . Club foot Son     Bilat  . Cancer Maternal Grandmother   . Diabetes Maternal Grandfather   . Cancer Paternal Grandmother   . Hypertension Paternal Grandfather   . Heart attack Paternal Grandfather   . Heart disease Brother   . Heart disease Brother    Social History:  reports that he quit smoking about 30 years ago. His smoking use included Cigarettes. He smoked 1.50 packs per day. He has never used smokeless tobacco. He reports that he does not drink alcohol or use illicit drugs.  Allergies:  Allergies  Allergen Reactions  . Codeine     Medications Prior to Admission  Medication Sig Dispense Refill  . amLODipine (NORVASC) 10 MG tablet Take 1 tablet by mouth  daily 90 tablet 3  . aspirin  81 MG tablet Take 81 mg by mouth daily.    . Canagliflozin-Metformin HCl [phone] MG TABS Take 1 tablet by mouth daily.    . fenofibrate 160 MG tablet Take 1 tablet by mouth  daily 90 tablet 2  . fluticasone (FLONASE) 50 MCG/ACT nasal spray Place 1 spray into both nostrils daily. 16 g 12  . gabapentin (NEURONTIN) 100 MG capsule Take 200 mg by mouth every morning.    Marland Kitchen glucose blood test strip Check sugar once daily 100 each 3  . [employer] (ACCU-CHEK SOFTCLIX) lancets Check sugar once daily 3 each 3  . loratadine (CLARITIN) 10 MG tablet Take 10 mg by mouth daily.    Marland Kitchen losartan (COZAAR) 100 MG tablet Take 1 tablet by mouth  daily 90 tablet 3  . omeprazole (PRILOSEC) 20 MG capsule Take 20 mg by mouth daily.    . sildenafil (VIAGRA) 100 MG tablet Take 100 mg by mouth daily as needed for erectile dysfunction.    . traMADol (ULTRAM) 50 MG tablet Take by mouth every 6 (six) hours as needed.      Results for orders placed or performed during the hospital encounter of 07/15/15 (from the past 48 hour(s))  Hepatic function panel     Status: Abnormal   Collection Time: 07/14/15  11:00 PM  Result Value Ref Range   Total Protein 5.5 (L) 6.5 - 8.1 g/dL   Albumin 4.1 3.5 - 5.0 g/dL   AST UNABLE TO REPORT DUE TO LIPEMIC INTERFERENCE 15 - 41 U/L   ALT UNABLE TO REPORT DUE TO LIPEMIC INTERFERENCE 17 - 63 U/L   Alkaline Phosphatase 72 38 - 126 U/L   Total Bilirubin <0.1 (L) 0.3 - 1.2 mg/dL   Bilirubin, Direct <0.1 (L) 0.1 - 0.5 mg/dL   Indirect Bilirubin NOT CALCULATED 0.3 - 0.9 mg/dL  Lipase, blood     Status: Abnormal   Collection Time: 07/14/15 11:00 PM  Result Value Ref Range   Lipase 144 (H) 11 - 51 U/L  Basic metabolic panel     Status: Abnormal   Collection Time: 07/15/15  8:24 PM  Result Value Ref Range   Sodium 127 (L) 135 - 145 mmol/L   Potassium 4.6 3.5 - 5.1 mmol/L   Chloride 98 (L) 101 - 111 mmol/L   CO2 20 (L) 22 - 32 mmol/L   Glucose, Bld 356 (H) 65 - 99 mg/dL   BUN 26 (H) 6 - 20  mg/dL   Creatinine, Ser 1.34 (H) 0.61 - 1.24 mg/dL   Calcium 9.2 8.9 - 10.3 mg/dL   GFR calc non Af Amer 56 (L) >60 mL/min   GFR calc Af Amer >60 >60 mL/min    Comment: (NOTE) The eGFR has been calculated using the CKD EPI equation. This calculation has not been validated in all clinical situations. eGFR's persistently <60 mL/min signify possible Chronic Kidney Disease.    Anion gap 9 5 - 15  CBC     Status: Abnormal   Collection Time: 07/15/15  8:24 PM  Result Value Ref Range   WBC 12.8 (H) 3.8 - 10.6 K/uL   RBC 5.23 4.40 - 5.90 MIL/uL   Hemoglobin 14.8 13.0 - 18.0 g/dL   HCT 41.7 40.0 - 52.0 %    Comment: SPUN HEMATOCRIT   MCV 81.7 80.0 - 100.0 fL   MCH 28.3 26.0 - 34.0 pg   MCHC 35.5 32.0 - 36.0 g/dL    Comment: CORRECTED FOR LIPEMIA   RDW 15.5 (H) 11.5 - 14.5 %   Platelets 303 150 - 440 K/uL  Troponin I     Status: None   Collection Time: 07/15/15  8:24 PM  Result Value Ref Range   Troponin I <0.03 <0.031 ng/mL    Comment:        NO INDICATION OF MYOCARDIAL INJURY.   Urinalysis complete, with microscopic ([facility] only)     Status: Abnormal   Collection Time: 07/15/15 10:45 PM  Result Value Ref Range   Color, Urine STRAW (A) YELLOW   APPearance CLEAR (A) CLEAR   Glucose, UA >500 (A) NEGATIVE mg/dL   Bilirubin Urine NEGATIVE NEGATIVE   Ketones, ur TRACE (A) NEGATIVE mg/dL   Specific Gravity, Urine 1.038 (H) 1.005 - 1.030   Hgb urine dipstick NEGATIVE NEGATIVE   pH 5.0 5.0 - 8.0   Protein, ur NEGATIVE NEGATIVE mg/dL   Nitrite NEGATIVE NEGATIVE   Leukocytes, UA NEGATIVE NEGATIVE   RBC / HPF 0-5 0 - 5 RBC/hpf   WBC, UA NONE SEEN 0 - 5 WBC/hpf   Bacteria, UA NONE SEEN NONE SEEN   Squamous Epithelial / LPF NONE SEEN NONE SEEN  Ethanol     Status: None   Collection Time: 07/16/15  1:10 AM  Result Value Ref Range   Alcohol, Ethyl (  B) UNABLE TO REPORT DUE TO LIPEMIC INTERFERENCE <5 mg/dL   Dg Chest 2 View  07/15/2015  CLINICAL DATA:  Right chest pain tonight.   Nonproductive cough. EXAM: CHEST  2 VIEW COMPARISON:  11/30/2014. FINDINGS: Normal sized heart. Clear lungs. Mild diffuse peribronchial thickening. Flattening of the hemidiaphragms. Small left upper lobe calcified granuloma or bone island. Minimal thoracic spine degenerative changes. IMPRESSION: 1. Mild bronchitic changes with mild progression. 2. Mild changes of COPD. Electronically Signed   By: Claudie Revering M.D.   On: 07/15/2015 20:47   Ct Angio Chest Aorta W/cm &/or Wo/cm  07/15/2015  CLINICAL DATA:  Central chest pain radiating to the back. Onset at 16:30. EXAM: CT ANGIOGRAPHY CHEST, ABDOMEN AND PELVIS TECHNIQUE: Multidetector CT imaging through the chest, abdomen and pelvis was performed using the standard protocol during bolus administration of intravenous contrast. Multiplanar reconstructed images and MIPs were obtained and reviewed to evaluate the vascular anatomy. CONTRAST:  140m OMNIPAQUE IOHEXOL 350 MG/ML SOLN COMPARISON:  None. FINDINGS: CTA CHEST FINDINGS The thoracic aorta is normal in caliber. There is no dissection. Great vessels are normal in caliber and are widely patent. Pulmonary vasculature is well opacified with no evidence of pulmonary embolism. Review of the MIP images confirms the above findings. Nonvascular findings in the chest: Clear lungs. Patent central airways. No effusions. CTA ABDOMEN AND PELVIS FINDINGS The abdominal aorta is normal in caliber and widely patent. The major branches of the aorta are widely patent. Common iliac and external iliac arteries are widely patent. Review of the MIP images confirms the above findings. Nonvascular findings: There is a small hiatal hernia. Unremarkable arterial phase appearances of the liver, spleen, adrenals and kidneys. There is mild edema around the pancreatic uncinate process and third portion of the duodenum; this may represent pancreatitis or duodenitis. No fluid collections. Bowel is otherwise remarkable only for uncomplicated  moderate colonic diverticulosis. IMPRESSION: 1. The thoracic and abdominal aorta is normal in caliber. There is no dissection or aneurysm. The major branches are all patent and normal in caliber. 2. No acute findings or significant abnormality in the chest. 3. Subtle edema around the pancreatic uncinate process and third portion of the duodenum, raising the question of a mild degree of pancreatitis or duodenitis. 4. Hiatal hernia. 5. Diverticulosis. Electronically Signed   By: DAndreas NewportM.D.   On: 07/15/2015 22:24   Ct Angio Abd/pel W/ And/or W/o  07/15/2015  CLINICAL DATA:  Central chest pain radiating to the back. Onset at 16:30. EXAM: CT ANGIOGRAPHY CHEST, ABDOMEN AND PELVIS TECHNIQUE: Multidetector CT imaging through the chest, abdomen and pelvis was performed using the standard protocol during bolus administration of intravenous contrast. Multiplanar reconstructed images and MIPs were obtained and reviewed to evaluate the vascular anatomy. CONTRAST:  1080mOMNIPAQUE IOHEXOL 350 MG/ML SOLN COMPARISON:  None. FINDINGS: CTA CHEST FINDINGS The thoracic aorta is normal in caliber. There is no dissection. Great vessels are normal in caliber and are widely patent. Pulmonary vasculature is well opacified with no evidence of pulmonary embolism. Review of the MIP images confirms the above findings. Nonvascular findings in the chest: Clear lungs. Patent central airways. No effusions. CTA ABDOMEN AND PELVIS FINDINGS The abdominal aorta is normal in caliber and widely patent. The major branches of the aorta are widely patent. Common iliac and external iliac arteries are widely patent. Review of the MIP images confirms the above findings. Nonvascular findings: There is a small hiatal hernia. Unremarkable arterial phase appearances of the liver, spleen, adrenals  and kidneys. There is mild edema around the pancreatic uncinate process and third portion of the duodenum; this may represent pancreatitis or duodenitis.  No fluid collections. Bowel is otherwise remarkable only for uncomplicated moderate colonic diverticulosis. IMPRESSION: 1. The thoracic and abdominal aorta is normal in caliber. There is no dissection or aneurysm. The major branches are all patent and normal in caliber. 2. No acute findings or significant abnormality in the chest. 3. Subtle edema around the pancreatic uncinate process and third portion of the duodenum, raising the question of a mild degree of pancreatitis or duodenitis. 4. Hiatal hernia. 5. Diverticulosis. Electronically Signed   By: Andreas Newport M.D.   On: 07/15/2015 22:24   US Abdomen Limited Ruq  07/16/2015  CLINICAL DATA:  Right upper quadrant pain radiating to the back. 12 hours duration. Leukocytosis. EXAM: US ABDOMEN LIMITED - RIGHT UPPER QUADRANT COMPARISON:  CT 07/15/2015 FINDINGS: Gallbladder: No gallstones or wall thickening visualized. No sonographic Murphy sign noted. Common bile duct: Diameter: 2.9 mm Liver: Diffuse hepatic echogenicity consistent with fatty infiltration. No focal liver lesion. IMPRESSION: Dense liver, likely fatty infiltration. Normal gallbladder and bile ducts. Electronically Signed   By: Andreas Newport M.D.   On: 07/16/2015 02:47    Review of Systems  Constitutional: Negative for fever and chills.  HENT: Negative for sore throat and tinnitus.   Eyes: Negative for blurred vision and redness.  Respiratory: Negative for cough and shortness of breath.   Cardiovascular: Negative for chest pain, palpitations, orthopnea and PND.  Gastrointestinal: Positive for nausea, vomiting and abdominal pain. Negative for diarrhea.  Genitourinary: Negative for dysuria, urgency and frequency.  Musculoskeletal: Negative for myalgias and joint pain.  Skin: Negative for rash.       No lesions  Neurological: Negative for speech change, focal weakness and weakness.  Endo/Heme/Allergies: Does not bruise/bleed easily.       No temperature intolerance   Psychiatric/Behavioral: Negative for depression and suicidal ideas.    Blood pressure 164/82, pulse 94, temperature 98.5 F (36.9 C), temperature source Oral, resp. rate 18, height _0  (1.727 m), weight 92.08 kg (203 lb), SpO2 95 %. Physical Exam  Nursing note and vitals reviewed. Constitutional: He is oriented to person, place, and time. He appears well-developed and well-nourished. No distress.  HENT:  Head: Normocephalic and atraumatic.  Mouth/Throat: Oropharynx is clear and moist.  Eyes: Conjunctivae and EOM are normal. Pupils are equal, round, and reactive to light. No scleral icterus.  Neck: Normal range of motion. Neck supple. No JVD present. No tracheal deviation present. No thyromegaly present.  Cardiovascular: Normal rate, regular rhythm and normal heart sounds.  Exam reveals no gallop and no friction rub.   No murmur heard. Respiratory: Effort normal and breath sounds normal.  GI: Soft. Bowel sounds are normal. He exhibits no distension and no mass. There is tenderness. There is no rebound and no guarding.  Genitourinary:  Deferred  Musculoskeletal: Normal range of motion. He exhibits no edema.  Lymphadenopathy:    He has no cervical adenopathy.  Neurological: He is alert and oriented to person, place, and time. No cranial nerve deficit.  Skin: Skin is warm and dry. No rash noted. No erythema.  Psychiatric: He has a normal mood and affect. His behavior is normal. Judgment and thought content normal.     Assessment/Plan This is a 59 year old Caucasian male admitted for acute pancreatitis. 1. Acute pancreatitis: Lipase is elevated and the patient has nausea and vomiting. Possibly alcohol induced as sodium is  quite low, but the patient states he does not drink. Check blood alcohol level. He will be placed on bowel rest and we will aggressively hydrate with intravenous fluid. Glucose is elevated but calcium is normal and renal function is acceptable; mild leukocytosis without  fever. Appears to be mild to moderate pancreatitis though labs for Ranson criteria are not yet back.   2. Chest pain: Atypical.Right sided chest pain atypical for cardiac or ischemic pain. Initial troponin is negative. Patient has history of cholecystitis and pain may be referred from the abdomen and gallbladder. Right upper quadrant ultrasound ordered to rule out gallstone pancreatitis. 3. Sepsis: Patient intermittently meets criteria via heart rate, tachypnea and leukocytosis. Blood cultures obtained. Patient started on Rocephin 4. Hypertension: Continue amlodipine and losartan per home regimen 5. Diabetes mellitus type 2: Hold oral hypoglycemics. Sliding scale insulin while hospitalized 6. GI prophylaxis: PPI 7. DVT prophylaxis: Heparin The patient is a full code. Time spent on admission orders and patient care approximately 45 minutes   Harrie Foreman 07/16/2015, 2:53 AM

## 2015-07-16 NOTE — Progress Notes (Signed)
[place] [facility] [place] NAME: Jeffrey [name]    MR#:  [id number]  DATE OF BIRTH:  [birth year]  SUBJECTIVE:  CHIEF COMPLAINT:   Chief Complaint  Patient presents with  . Chest Pain    Pt c/o midsternal chest pain radiating around to right upper back since 430 pm today. Denies SOB. +nausea at times. Denies vomiting. Denies diaphoresis.   Continued abdominal pain.  REVIEW OF SYSTEMS:   Review of Systems  Constitutional: Positive for malaise/fatigue. Negative for fever.  Respiratory: Negative for shortness of breath.   Cardiovascular: Negative for chest pain and palpitations.  Gastrointestinal: Positive for nausea and abdominal pain. Negative for vomiting.  Genitourinary: Negative for dysuria.  Neurological: Positive for weakness.    DRUG ALLERGIES:   Allergies  Allergen Reactions  . Codeine     VITALS:  Blood pressure 135/75, pulse 93, temperature 98.8 F (37.1 C), temperature source Oral, resp. rate 10, height 5\' 8"  (1.727 m), weight 91.1 kg (200 lb 13.4 oz), SpO2 94 %.  PHYSICAL EXAMINATION:  GENERAL:  59 y.o.-year-old patient lying in the bed, very uncomfortable LUNGS: Normal breath sounds bilaterally, no wheezing, rales,rhonchi or crepitation. No use of accessory muscles of respiration.  CARDIOVASCULAR: S1, S2 normal. No murmurs, rubs, or gallops.  ABDOMEN: Soft, tender over the upper abdomen midepigastric area with guarding, nondistended. Bowel sounds present. No organomegaly or mass.  EXTREMITIES: No pedal edema, cyanosis, or clubbing.  NEUROLOGIC: Cranial nerves II through XII are intact. Muscle strength 5/5 in all extremities. Sensation intact. Gait not checked.  PSYCHIATRIC: The patient is alert and oriented x 3. Anxious and uncomfortable SKIN: No obvious rash, lesion, or ulcer.    LABORATORY PANEL:   CBC  Recent Labs Lab 07/15/15 2024  WBC 12.8*  HGB 14.8  HCT 41.7  PLT 303    ------------------------------------------------------------------------------------------------------------------  Chemistries   Recent Labs Lab 07/15/15 2024 07/16/15 0627  NA 127*  --   K 4.6  --   CL 98*  --   CO2 20*  --   GLUCOSE 356*  --   BUN 26*  --   CREATININE 1.34*  --   CALCIUM 9.2  --   AST  --  UNABLE TO PERFORM DUE TO LIPEMIC INTERFERENCE  ALT  --  UNABLE TO PERFORM DUE TO LIPEMIC INTERFERENCE  ALKPHOS  --  46  BILITOT  --  2.0*   ------------------------------------------------------------------------------------------------------------------  Cardiac Enzymes  Recent Labs Lab 07/15/15 2024  TROPONINI <0.03   ------------------------------------------------------------------------------------------------------------------  RADIOLOGY:  Dg Chest 2 View  07/15/2015  CLINICAL DATA:  Right chest pain tonight.  Nonproductive cough. EXAM: CHEST  2 VIEW COMPARISON:  11/30/2014. FINDINGS: Normal sized heart. Clear lungs. Mild diffuse peribronchial thickening. Flattening of the hemidiaphragms. Small left upper lobe calcified granuloma or bone island. Minimal thoracic spine degenerative changes. IMPRESSION: 1. Mild bronchitic changes with mild progression. 2. Mild changes of COPD. Electronically Signed   By: Claudie Revering M.D.   On: 07/15/2015 20:47   Ct Angio Chest Aorta W/cm &/or Wo/cm  07/15/2015  CLINICAL DATA:  Central chest pain radiating to the back. Onset at 16:30. EXAM: CT ANGIOGRAPHY CHEST, ABDOMEN AND PELVIS TECHNIQUE: Multidetector CT imaging through the chest, abdomen and pelvis was performed using the standard protocol during bolus administration of intravenous contrast. Multiplanar reconstructed images and MIPs were obtained and reviewed to evaluate the vascular anatomy. CONTRAST:  189mL OMNIPAQUE IOHEXOL 350 MG/ML SOLN COMPARISON:  None. FINDINGS: CTA CHEST FINDINGS  The thoracic aorta is normal in caliber. There is no dissection. Great vessels are normal  in caliber and are widely patent. Pulmonary vasculature is well opacified with no evidence of pulmonary embolism. Review of the MIP images confirms the above findings. Nonvascular findings in the chest: Clear lungs. Patent central airways. No effusions. CTA ABDOMEN AND PELVIS FINDINGS The abdominal aorta is normal in caliber and widely patent. The major branches of the aorta are widely patent. Common iliac and external iliac arteries are widely patent. Review of the MIP images confirms the above findings. Nonvascular findings: There is a small hiatal hernia. Unremarkable arterial phase appearances of the liver, spleen, adrenals and kidneys. There is mild edema around the pancreatic uncinate process and third portion of the duodenum; this may represent pancreatitis or duodenitis. No fluid collections. Bowel is otherwise remarkable only for uncomplicated moderate colonic diverticulosis. IMPRESSION: 1. The thoracic and abdominal aorta is normal in caliber. There is no dissection or aneurysm. The major branches are all patent and normal in caliber. 2. No acute findings or significant abnormality in the chest. 3. Subtle edema around the pancreatic uncinate process and third portion of the duodenum, raising the question of a mild degree of pancreatitis or duodenitis. 4. Hiatal hernia. 5. Diverticulosis. Electronically Signed   By: Andreas Newport M.D.   On: 07/15/2015 22:24   Ct Angio Abd/pel W/ And/or W/o  07/15/2015  CLINICAL DATA:  Central chest pain radiating to the back. Onset at 16:30. EXAM: CT ANGIOGRAPHY CHEST, ABDOMEN AND PELVIS TECHNIQUE: Multidetector CT imaging through the chest, abdomen and pelvis was performed using the standard protocol during bolus administration of intravenous contrast. Multiplanar reconstructed images and MIPs were obtained and reviewed to evaluate the vascular anatomy. CONTRAST:  194mL OMNIPAQUE IOHEXOL 350 MG/ML SOLN COMPARISON:  None. FINDINGS: CTA CHEST FINDINGS The thoracic  aorta is normal in caliber. There is no dissection. Great vessels are normal in caliber and are widely patent. Pulmonary vasculature is well opacified with no evidence of pulmonary embolism. Review of the MIP images confirms the above findings. Nonvascular findings in the chest: Clear lungs. Patent central airways. No effusions. CTA ABDOMEN AND PELVIS FINDINGS The abdominal aorta is normal in caliber and widely patent. The major branches of the aorta are widely patent. Common iliac and external iliac arteries are widely patent. Review of the MIP images confirms the above findings. Nonvascular findings: There is a small hiatal hernia. Unremarkable arterial phase appearances of the liver, spleen, adrenals and kidneys. There is mild edema around the pancreatic uncinate process and third portion of the duodenum; this may represent pancreatitis or duodenitis. No fluid collections. Bowel is otherwise remarkable only for uncomplicated moderate colonic diverticulosis. IMPRESSION: 1. The thoracic and abdominal aorta is normal in caliber. There is no dissection or aneurysm. The major branches are all patent and normal in caliber. 2. No acute findings or significant abnormality in the chest. 3. Subtle edema around the pancreatic uncinate process and third portion of the duodenum, raising the question of a mild degree of pancreatitis or duodenitis. 4. Hiatal hernia. 5. Diverticulosis. Electronically Signed   By: Andreas Newport M.D.   On: 07/15/2015 22:24   US Abdomen Limited Ruq  07/16/2015  CLINICAL DATA:  Right upper quadrant pain radiating to the back. 12 hours duration. Leukocytosis. EXAM: US ABDOMEN LIMITED - RIGHT UPPER QUADRANT COMPARISON:  CT 07/15/2015 FINDINGS: Gallbladder: No gallstones or wall thickening visualized. No sonographic Murphy sign noted. Common bile duct: Diameter: 2.9 mm Liver: Diffuse hepatic  echogenicity consistent with fatty infiltration. No focal liver lesion. IMPRESSION: Dense liver, likely  fatty infiltration. Normal gallbladder and bile ducts. Electronically Signed   By: Andreas Newport M.D.   On: 07/16/2015 02:47    EKG:   Orders placed or performed during the hospital encounter of 07/15/15  . ED EKG within 10 minutes  . ED EKG within 10 minutes  . EKG 12-Lead  . EKG 12-Lead    ASSESSMENT AND PLAN:   1. Acute pancreatitis - Due to elevated triglycerides. Transferred to stepdown unit for insulin drip - Continue IV hydration and pain management - Gallbladder normal on abdominal ultrasound  2. sepsis: Blood cultures pending, started on Rocephin.  3. Hypertension - Continue home medications  4. diabetes mellitus type 2: Continue sliding scale  All the records are reviewed and case discussed with Care Management/Social Workerr. Management plans discussed with the patient, family and they are in agreement.  CODE STATUS: Full  TOTAL TIME TAKING CARE OF THIS PATIENT:35 minutes.  Greater than 50% of time spent in care coordination and counseling. POSSIBLE D/C IN ? DAYS, DEPENDING ON CLINICAL CONDITION.   Myrtis Ser M.D on 07/16/2015 at 4:26 PM  Between 7am to 6pm - Pager - [phone]  After 6pm go to [email or web address] - password [email or web address]  Office  [phone]  CC: Primary care physician; [name], MD

## 2015-07-16 NOTE — Progress Notes (Signed)
Patient transferred to CCU. VSS. Report given to [facility].

## 2015-07-17 LAB — COMPREHENSIVE METABOLIC PANEL
ALT: 17 U/L (ref 17–63)
AST: 12 U/L — ABNORMAL LOW (ref 15–41)
Albumin: 3.2 g/dL — ABNORMAL LOW (ref 3.5–5.0)
Alkaline Phosphatase: 41 U/L (ref 38–126)
Anion gap: 6 (ref 5–15)
BUN: 10 mg/dL (ref 6–20)
CO2: 26 mmol/L (ref 22–32)
Calcium: 8.6 mg/dL — ABNORMAL LOW (ref 8.9–10.3)
Chloride: 101 mmol/L (ref 101–111)
Creatinine, Ser: 1.08 mg/dL (ref 0.61–1.24)
GFR calc Af Amer: >60 mL/min (ref >60)
GFR calc non Af Amer: >60 mL/min (ref >60)
Glucose, Bld: 123 mg/dL — ABNORMAL HIGH (ref 65–99)
Potassium: 3 mmol/L — ABNORMAL LOW (ref 3.5–5.1)
Sodium: 133 mmol/L — ABNORMAL LOW (ref 135–145)
Total Bilirubin: 0.6 mg/dL (ref 0.3–1.2)
Total Protein: 6.9 g/dL (ref 6.5–8.1)

## 2015-07-17 LAB — CBC
HCT: 38.9 % — ABNORMAL LOW (ref 40.0–52.0)
Hemoglobin: 13.3 g/dL (ref 13.0–18.0)
MCH: 28.3 pg (ref 26.0–34.0)
MCHC: 34.3 g/dL (ref 32.0–36.0)
MCV: 82.5 fL (ref 80.0–100.0)
Platelets: 177 10*3/uL (ref 150–440)
RBC: 4.71 MIL/uL (ref 4.40–5.90)
RDW: 15.2 % — ABNORMAL HIGH (ref 11.5–14.5)
WBC: 8.4 10*3/uL (ref 3.8–10.6)

## 2015-07-17 LAB — GLUCOSE, CAPILLARY
Glucose-Capillary: 102 mg/dL — ABNORMAL HIGH (ref 65–99)
Glucose-Capillary: 104 mg/dL — ABNORMAL HIGH (ref 65–99)
Glucose-Capillary: 106 mg/dL — ABNORMAL HIGH (ref 65–99)
Glucose-Capillary: 109 mg/dL — ABNORMAL HIGH (ref 65–99)
Glucose-Capillary: 117 mg/dL — ABNORMAL HIGH (ref 65–99)
Glucose-Capillary: 129 mg/dL — ABNORMAL HIGH (ref 65–99)
Glucose-Capillary: 133 mg/dL — ABNORMAL HIGH (ref 65–99)
Glucose-Capillary: 137 mg/dL — ABNORMAL HIGH (ref 65–99)
Glucose-Capillary: 139 mg/dL — ABNORMAL HIGH (ref 65–99)
Glucose-Capillary: 140 mg/dL — ABNORMAL HIGH (ref 65–99)
Glucose-Capillary: 145 mg/dL — ABNORMAL HIGH (ref 65–99)
Glucose-Capillary: 153 mg/dL — ABNORMAL HIGH (ref 65–99)
Glucose-Capillary: 163 mg/dL — ABNORMAL HIGH (ref 65–99)
Glucose-Capillary: 169 mg/dL — ABNORMAL HIGH (ref 65–99)
Glucose-Capillary: 237 mg/dL — ABNORMAL HIGH (ref 65–99)

## 2015-07-17 LAB — LIPID PANEL
Cholesterol: 278 mg/dL — ABNORMAL HIGH (ref 0–200)
HDL: 15 mg/dL — ABNORMAL LOW (ref >40)
LDL Cholesterol: UNDETERMINED mg/dL (ref 0–99)
Total CHOL/HDL Ratio: 18.5 RATIO
Triglycerides: 1042 mg/dL — ABNORMAL HIGH (ref <150)
VLDL: UNDETERMINED mg/dL (ref 0–40)

## 2015-07-17 MED ORDER — POTASSIUM CHLORIDE 10 MEQ/100ML IV SOLN
10.0000 meq | INTRAVENOUS | Status: AC
  Administered 2015-07-17 (×4): 10 meq via INTRAVENOUS
  Filled 2015-07-17 (×4): qty 100

## 2015-07-17 MED ORDER — ACETAMINOPHEN 325 MG PO TABS
650.0000 mg | ORAL_TABLET | Freq: Four times a day (QID) | ORAL | Status: DC | PRN
  Administered 2015-07-17 – 2015-07-18 (×2): 650 mg via ORAL
  Filled 2015-07-17 (×2): qty 2

## 2015-07-17 NOTE — Progress Notes (Signed)
Patient asking for tylenol for headache. MD gave order for 650mg  of tylenol PO PRN q6H.

## 2015-07-17 NOTE — Progress Notes (Signed)
RN made Dr. Volanda Napoleon aware of potassium 3.0 and that patient is getting IVF D5 at 275cc/H and that RN is going to have to titrate IVF up to 300cc/H per order for blood glucose less than 150. MD stated she would adjust fluids and place orders herself.

## 2015-07-17 NOTE — Progress Notes (Signed)
Inpatient Diabetes Program Recommendations  AACE/ADA: New Consensus Statement on Inpatient Glycemic Control (2015)  Target Ranges:  Prepandial:   less than 140 mg/dL      Peak postprandial:   less than 180 mg/dL (1-2 hours)      Critically ill patients:  140 - 180 mg/dL   Review of Glycemic Control:  Results for AMITH, NOBLETT (MRN [id number]) as of 07/17/2015 14:48  Ref. Range 07/17/2015 08:39 07/17/2015 09:36 07/17/2015 10:30 07/17/2015 11:34 07/17/2015 13:26  Glucose-Capillary Latest Ref Range: 65-99 mg/dL 129 (H) 139 (H) 145 (H) 237 (H) 163 (H)  Results for AZZAM, ELSAESSER (MRN [id number]) as of 07/17/2015 14:48  Ref. Range 04/18/2015 14:54 07/15/2015 20:24  Hemoglobin A1C Latest Ref Range: 4.0-6.0 % 7.1 9.5 (H)   Diabetes history: Type 2 diabetes for past 10 years per patient Outpatient Diabetes medications: Canagliflozin-Metformin [id number] mg daily Current orders for Inpatient glycemic control:  IV insulin for increased Triglycerides  Inpatient Diabetes Program Recommendations:    Spoke with patient briefly.  He states that he was initially on metformin for diabetes and Invokana was added last year.  He states that blood sugars have been elevated in the 200's at home prior to admit.  May need low dose basal insulin added prior to d/c of insulin drip (when appropriate).  May consider Levemir 15 units daily 2 hours prior to d/c of insulin drip.  He states that he see's Dr. Rosanna Randy [facility]) for his diabetes.  Will follow.  Thanks, Adah Perl, RN, BC-ADM Inpatient Diabetes Coordinator Pager [phone] (8a-5p)

## 2015-07-17 NOTE — Progress Notes (Signed)
[place] at [place] NAME: Jeffrey [name]    MR#:  [id number]  DATE OF BIRTH:  05-20-1956  SUBJECTIVE:  CHIEF COMPLAINT:   Chief Complaint  Patient presents with  . Chest Pain    Pt c/o midsternal chest pain radiating around to right upper back since 430 pm today. Denies SOB. +nausea at times. Denies vomiting. Denies diaphoresis.   Abdominal pain resolved. Headache this morning, now resolved.  REVIEW OF SYSTEMS:   Review of Systems  Constitutional: Positive for malaise/fatigue. Negative for fever.  Respiratory: Negative for shortness of breath.   Cardiovascular: Negative for chest pain and palpitations.  Gastrointestinal: Positive for nausea and abdominal pain. Negative for vomiting.  Genitourinary: Negative for dysuria.  Neurological: Positive for weakness.    DRUG ALLERGIES:   Allergies  Allergen Reactions  . Codeine     VITALS:  Blood pressure 115/75, pulse 81, temperature 98.6 F (37 C), temperature source Oral, resp. rate 19, height 5\' 8"  (1.727 m), weight 92.1 kg (203 lb 0.7 oz), SpO2 96 %.  PHYSICAL EXAMINATION:  GENERAL:  59 y.o.-year-old patient lying in the bed, no distress LUNGS: Normal breath sounds bilaterally, no wheezing, rales,rhonchi or crepitation. No use of accessory muscles of respiration.  CARDIOVASCULAR: S1, S2 normal. No murmurs, rubs, or gallops.  ABDOMEN: Soft, nontender, nondistended. Bowel sounds present. No organomegaly or mass.  EXTREMITIES: No pedal edema, cyanosis, or clubbing.  NEUROLOGIC: Cranial nerves II through XII are intact. Muscle strength 5/5 in all extremities. Sensation intact. Gait not checked.  PSYCHIATRIC: The patient is alert and oriented x 3.  SKIN: No obvious rash, lesion, or ulcer.    LABORATORY PANEL:   CBC  Recent Labs Lab 07/17/15 0545  WBC 8.4  HGB 13.3  HCT 38.9*  PLT 177    ------------------------------------------------------------------------------------------------------------------  Chemistries   Recent Labs Lab 07/17/15 0545  NA 133*  K 3.0*  CL 101  CO2 26  GLUCOSE 123*  BUN 10  CREATININE 1.08  CALCIUM 8.6*  AST 12*  ALT 17  ALKPHOS 41  BILITOT 0.6   ------------------------------------------------------------------------------------------------------------------  Cardiac Enzymes  Recent Labs Lab 07/15/15 2024  TROPONINI <0.03   ------------------------------------------------------------------------------------------------------------------  RADIOLOGY:  Dg Chest 2 View  07/15/2015  CLINICAL DATA:  Right chest pain tonight.  Nonproductive cough. EXAM: CHEST  2 VIEW COMPARISON:  11/30/2014. FINDINGS: Normal sized heart. Clear lungs. Mild diffuse peribronchial thickening. Flattening of the hemidiaphragms. Small left upper lobe calcified granuloma or bone island. Minimal thoracic spine degenerative changes. IMPRESSION: 1. Mild bronchitic changes with mild progression. 2. Mild changes of COPD. Electronically Signed   By: Claudie Revering M.D.   On: 07/15/2015 20:47   Ct Angio Chest Aorta W/cm &/or Wo/cm  07/15/2015  CLINICAL DATA:  Central chest pain radiating to the back. Onset at 16:30. EXAM: CT ANGIOGRAPHY CHEST, ABDOMEN AND PELVIS TECHNIQUE: Multidetector CT imaging through the chest, abdomen and pelvis was performed using the standard protocol during bolus administration of intravenous contrast. Multiplanar reconstructed images and MIPs were obtained and reviewed to evaluate the vascular anatomy. CONTRAST:  139mL OMNIPAQUE IOHEXOL 350 MG/ML SOLN COMPARISON:  None. FINDINGS: CTA CHEST FINDINGS The thoracic aorta is normal in caliber. There is no dissection. Great vessels are normal in caliber and are widely patent. Pulmonary vasculature is well opacified with no evidence of pulmonary embolism. Review of the MIP images confirms the above  findings. Nonvascular findings in the chest: Clear lungs. Patent central airways. No effusions. CTA  ABDOMEN AND PELVIS FINDINGS The abdominal aorta is normal in caliber and widely patent. The major branches of the aorta are widely patent. Common iliac and external iliac arteries are widely patent. Review of the MIP images confirms the above findings. Nonvascular findings: There is a small hiatal hernia. Unremarkable arterial phase appearances of the liver, spleen, adrenals and kidneys. There is mild edema around the pancreatic uncinate process and third portion of the duodenum; this may represent pancreatitis or duodenitis. No fluid collections. Bowel is otherwise remarkable only for uncomplicated moderate colonic diverticulosis. IMPRESSION: 1. The thoracic and abdominal aorta is normal in caliber. There is no dissection or aneurysm. The major branches are all patent and normal in caliber. 2. No acute findings or significant abnormality in the chest. 3. Subtle edema around the pancreatic uncinate process and third portion of the duodenum, raising the question of a mild degree of pancreatitis or duodenitis. 4. Hiatal hernia. 5. Diverticulosis. Electronically Signed   By: Andreas Newport M.D.   On: 07/15/2015 22:24   Ct Angio Abd/pel W/ And/or W/o  07/15/2015  CLINICAL DATA:  Central chest pain radiating to the back. Onset at 16:30. EXAM: CT ANGIOGRAPHY CHEST, ABDOMEN AND PELVIS TECHNIQUE: Multidetector CT imaging through the chest, abdomen and pelvis was performed using the standard protocol during bolus administration of intravenous contrast. Multiplanar reconstructed images and MIPs were obtained and reviewed to evaluate the vascular anatomy. CONTRAST:  15mL OMNIPAQUE IOHEXOL 350 MG/ML SOLN COMPARISON:  None. FINDINGS: CTA CHEST FINDINGS The thoracic aorta is normal in caliber. There is no dissection. Great vessels are normal in caliber and are widely patent. Pulmonary vasculature is well opacified with no  evidence of pulmonary embolism. Review of the MIP images confirms the above findings. Nonvascular findings in the chest: Clear lungs. Patent central airways. No effusions. CTA ABDOMEN AND PELVIS FINDINGS The abdominal aorta is normal in caliber and widely patent. The major branches of the aorta are widely patent. Common iliac and external iliac arteries are widely patent. Review of the MIP images confirms the above findings. Nonvascular findings: There is a small hiatal hernia. Unremarkable arterial phase appearances of the liver, spleen, adrenals and kidneys. There is mild edema around the pancreatic uncinate process and third portion of the duodenum; this may represent pancreatitis or duodenitis. No fluid collections. Bowel is otherwise remarkable only for uncomplicated moderate colonic diverticulosis. IMPRESSION: 1. The thoracic and abdominal aorta is normal in caliber. There is no dissection or aneurysm. The major branches are all patent and normal in caliber. 2. No acute findings or significant abnormality in the chest. 3. Subtle edema around the pancreatic uncinate process and third portion of the duodenum, raising the question of a mild degree of pancreatitis or duodenitis. 4. Hiatal hernia. 5. Diverticulosis. Electronically Signed   By: Andreas Newport M.D.   On: 07/15/2015 22:24   US Abdomen Limited Ruq  07/16/2015  CLINICAL DATA:  Right upper quadrant pain radiating to the back. 12 hours duration. Leukocytosis. EXAM: US ABDOMEN LIMITED - RIGHT UPPER QUADRANT COMPARISON:  CT 07/15/2015 FINDINGS: Gallbladder: No gallstones or wall thickening visualized. No sonographic Murphy sign noted. Common bile duct: Diameter: 2.9 mm Liver: Diffuse hepatic echogenicity consistent with fatty infiltration. No focal liver lesion. IMPRESSION: Dense liver, likely fatty infiltration. Normal gallbladder and bile ducts. Electronically Signed   By: Andreas Newport M.D.   On: 07/16/2015 02:47    EKG:   Orders placed or  performed during the hospital encounter of 07/15/15  . ED EKG  within 10 minutes  . ED EKG within 10 minutes  . EKG 12-Lead  . EKG 12-Lead    ASSESSMENT AND PLAN:   1. Acute pancreatitis: Pain is improving - Due to elevated triglycerides. Continue insulin drip - Continue IV hydration and pain management - Gallbladder normal on abdominal ultrasound  2. sepsis: Blood cultures pending, started on Rocephin.  3. Hypertension - Continue home medications  4. diabetes mellitus type 2: Continue insulin drip  All the records are reviewed and case discussed with Care Management/Social Workerr. Management plans discussed with the patient, family and they are in agreement.  CODE STATUS: Full  TOTAL TIME TAKING CARE OF THIS PATIENT:35 minutes.  Greater than 50% of time spent in care coordination and counseling. POSSIBLE D/C IN ? DAYS, DEPENDING ON CLINICAL CONDITION.   Myrtis Ser M.D on 07/17/2015 at 5:06 PM  Between 7am to 6pm - Pager - [phone]  After 6pm go to [email or web address] - password [email or web address] [facility]  Office  [phone]  CC: Primary care physician; [name], MD

## 2015-07-18 LAB — GLUCOSE, CAPILLARY
Glucose-Capillary: 203 mg/dL — ABNORMAL HIGH (ref 65–99)
Glucose-Capillary: 140 mg/dL — ABNORMAL HIGH (ref 65–99)
Glucose-Capillary: 132 mg/dL — ABNORMAL HIGH (ref 65–99)
Glucose-Capillary: 142 mg/dL — ABNORMAL HIGH (ref 65–99)
Glucose-Capillary: 132 mg/dL — ABNORMAL HIGH (ref 65–99)
Glucose-Capillary: 128 mg/dL — ABNORMAL HIGH (ref 65–99)
Glucose-Capillary: 128 mg/dL — ABNORMAL HIGH (ref 65–99)
Glucose-Capillary: 130 mg/dL — ABNORMAL HIGH (ref 65–99)
Glucose-Capillary: 135 mg/dL — ABNORMAL HIGH (ref 65–99)
Glucose-Capillary: 126 mg/dL — ABNORMAL HIGH (ref 65–99)
Glucose-Capillary: 190 mg/dL — ABNORMAL HIGH (ref 65–99)

## 2015-07-18 LAB — COMPREHENSIVE METABOLIC PANEL
ALT: 16 U/L — ABNORMAL LOW (ref 17–63)
AST: 15 U/L (ref 15–41)
Albumin: 3.2 g/dL — ABNORMAL LOW (ref 3.5–5.0)
Alkaline Phosphatase: 41 U/L (ref 38–126)
Anion gap: 6 (ref 5–15)
BUN: 9 mg/dL (ref 6–20)
CO2: 27 mmol/L (ref 22–32)
Calcium: 8.8 mg/dL — ABNORMAL LOW (ref 8.9–10.3)
Chloride: 102 mmol/L (ref 101–111)
Creatinine, Ser: 1.21 mg/dL (ref 0.61–1.24)
GFR calc Af Amer: >60 mL/min (ref >60)
GFR calc non Af Amer: >60 mL/min (ref >60)
Glucose, Bld: 134 mg/dL — ABNORMAL HIGH (ref 65–99)
Potassium: 3.3 mmol/L — ABNORMAL LOW (ref 3.5–5.1)
Sodium: 135 mmol/L (ref 135–145)
Total Bilirubin: 0.5 mg/dL (ref 0.3–1.2)
Total Protein: 6.9 g/dL (ref 6.5–8.1)

## 2015-07-18 LAB — LIPID PANEL
Cholesterol: 307 mg/dL — ABNORMAL HIGH (ref 0–200)
HDL: 16 mg/dL — ABNORMAL LOW (ref >40)
LDL Cholesterol: UNDETERMINED mg/dL (ref 0–99)
Total CHOL/HDL Ratio: 19.2 RATIO
Triglycerides: 753 mg/dL — ABNORMAL HIGH (ref <150)
VLDL: UNDETERMINED mg/dL (ref 0–40)

## 2015-07-18 LAB — CBC
HCT: 42 % (ref 40.0–52.0)
Hemoglobin: 13.8 g/dL (ref 13.0–18.0)
MCH: 27.2 pg (ref 26.0–34.0)
MCHC: 32.9 g/dL (ref 32.0–36.0)
MCV: 82.7 fL (ref 80.0–100.0)
Platelets: 192 10*3/uL (ref 150–440)
RBC: 5.08 MIL/uL (ref 4.40–5.90)
RDW: 15.5 % — ABNORMAL HIGH (ref 11.5–14.5)
WBC: 7.1 10*3/uL (ref 3.8–10.6)

## 2015-07-18 LAB — LIPASE, BLOOD: Lipase: 39 U/L (ref 11–51)

## 2015-07-18 MED ORDER — INSULIN ASPART 100 UNIT/ML ~~LOC~~ SOLN: 0.0000 [IU] | Freq: Every day | SUBCUTANEOUS | Status: DC

## 2015-07-18 MED ORDER — FENOFIBRATE 160 MG PO TABS
160.0000 mg | ORAL_TABLET | Freq: Two times a day (BID) | ORAL | Status: DC
  Administered 2015-07-18 – 2015-07-19 (×2): 160 mg via ORAL
  Filled 2015-07-18 (×2): qty 1

## 2015-07-18 MED ORDER — POTASSIUM CHLORIDE 20 MEQ PO PACK
20.0000 meq | PACK | Freq: Two times a day (BID) | ORAL | Status: AC
  Administered 2015-07-18 (×2): 20 meq via ORAL
  Filled 2015-07-18 (×2): qty 1

## 2015-07-18 MED ORDER — INSULIN ASPART 100 UNIT/ML ~~LOC~~ SOLN
0.0000 [IU] | Freq: Three times a day (TID) | SUBCUTANEOUS | Status: DC
  Administered 2015-07-18: 2 [IU] via SUBCUTANEOUS
  Administered 2015-07-19 (×2): 3 [IU] via SUBCUTANEOUS
  Filled 2015-07-18: qty 3
  Filled 2015-07-18: qty 2
  Filled 2015-07-18: qty 3

## 2015-07-18 NOTE — Progress Notes (Signed)
During rounds patient's blood glucose was discussed  and MD gave order for q2H fingersticks instead of q1H and order for clear liquid diet.

## 2015-07-18 NOTE — Progress Notes (Signed)
Initial Nutrition Assessment    INTERVENTION:   Meals and Snacks: Cater to patient preferences, recommend advancing diet as tolerated to Heart Healthy, Carb Modified Education: provided pt with written and verbal education on High Triglyceride Nutrition Therapy; pt very receptive to education, adherence likely. Also discussed importance of following diabetic diet; pt declined questions regarding diabetic diet at this time   NUTRITION DIAGNOSIS:   Inadequate oral intake related to acute illness as evidenced by  (NPO/CL).  GOAL:   Patient will meet greater than or equal to 90% of their needs  MONITOR:    (Energy Intake, Anthropometrics, Digestive System, Electrolyte/Renal Profile)  REASON FOR ASSESSMENT:   Diagnosis    ASSESSMENT:    Pt admitted with chest pain, abdominal pain with acute pancreatitis due to high TG; in ICU for insulin drip  Past Medical History  Diagnosis Date  . Plantar fasciitis   . Cholecystitis   . Actinic keratosis   . Peyronie's disease   . Erectile dysfunction   . Diverticulosis   . Obesity   . Hyperlipidemia   . Diabetes mellitus ([place])   . Myalgia   . Seasonal allergies   . GERD (gastroesophageal reflux disease)   . Hypertension   . Kidney stones     Diet Order:  Diet clear liquid Room service appropriate?: Yes; Fluid consistency:: Thin   Energy Intake: diet advanced today, tolerated CL tray at lunch with no abdominal pain; previously NPO  Food and Nutrition Related History: pt reports good appetite prior to admission  Skin:  Reviewed, no issues  Last BM:  12/6   Electrolyte and Renal Profile:  Recent Labs Lab 07/15/15 2024 07/17/15 0545 07/18/15 0605  BUN 26* 10 9  CREATININE 1.34* 1.08 1.21  NA 127* 133* 135  K 4.6 3.0* 3.3*   Glucose Profile:   Recent Labs  07/18/15 0759 07/18/15 0902 07/18/15 1406  GLUCAP 128* 130* 135*   Lab Results  Component Value Date   HGBA1C 9.5* 07/15/2015    Lipid Profile:      Component Value Date/Time   CHOL 307* 07/18/2015 0605   TRIG 753* 07/18/2015 0605   HDL 16* 07/18/2015 0605   CHOLHDL 19.2 07/18/2015 0605   VLDL UNABLE TO CALCULATE IF TRIGLYCERIDE OVER 400 mg/dL 07/18/2015 0605   LDLCALC UNABLE TO CALCULATE IF TRIGLYCERIDE OVER 400 mg/dL 07/18/2015 0605   Lipase     Component Value Date/Time   LIPASE 39 07/18/2015 0605    Meds: fenobfirbate, ss novolog,  off insulin drip at present  Height:   Ht Readings from Last 1 Encounters:  07/16/15 5\' 8"  (1.727 m)    Weight: weight relatively stable  Wt Readings from Last 1 Encounters:  07/18/15 203 lb 4.2 oz (92.2 kg)    Filed Weights   07/16/15 1030 07/17/15 0600 07/18/15 0500  Weight: 200 lb 13.4 oz (91.1 kg) 203 lb 0.7 oz (92.1 kg) 203 lb 4.2 oz (92.2 kg)   Wt Readings from Last 10 Encounters:  07/18/15 203 lb 4.2 oz (92.2 kg)  05/10/15 205 lb (92.987 kg)  04/18/15 209 lb (94.802 kg)  03/23/15 202 lb (91.627 kg)  02/15/15 212 lb (96.163 kg)  11/30/14 208 lb (94.348 kg)    BMI:  Body mass index is 30.91 kg/(m^2).  LOW Care Level  Kerman Passey MS, [place], LDN [phone] Pager

## 2015-07-18 NOTE — Progress Notes (Signed)
Report called to Lavella Lemons, RN on 2A.

## 2015-07-18 NOTE — Progress Notes (Signed)
Patient moved to room 241 by wheelchair with Chasity, NT and wife at side. 2A tele monitor applied to patient and verified with Thayer Headings, tele clerk prior to leaving ICU. A&Ox4 with no distress noted when leaving ICU. Denied abdominal pain during shift. Tolerating clear liquid diet with no nausea or vomiting.

## 2015-07-18 NOTE — Progress Notes (Signed)
59yo wm transferred from CCU to room 241.  A&O x3, gait steady.  No distress on ra.  Cardiac monitor in place, pt denies chest pain.  Abdomen soft, distended, tender palpation.  SL lt fa/ac flushes well. Denies need. Oriented to room and surroundings.  Wife at bedside, CB in reach, SR up x 2.

## 2015-07-18 NOTE — Progress Notes (Signed)
[place] at [place] NAME: Jeffrey [name]    MR#:  [id number]  DATE OF BIRTH:  [birth year]  SUBJECTIVE:  CHIEF COMPLAINT:   Chief Complaint  Patient presents with  . Chest Pain    Pt c/o midsternal chest pain radiating around to right upper back since 430 pm today. Denies SOB. +nausea at times. Denies vomiting. Denies diaphoresis.   Abdominal pain has resolved. Has tolerated clear liquid diet this morning.  REVIEW OF SYSTEMS:   Review of Systems  Constitutional: Positive for malaise/fatigue. Negative for fever.  Respiratory: Negative for shortness of breath.   Cardiovascular: Negative for chest pain and palpitations.  Gastrointestinal: Positive for nausea and abdominal pain. Negative for vomiting.  Genitourinary: Negative for dysuria.  Neurological: Positive for weakness.    DRUG ALLERGIES:   Allergies  Allergen Reactions  . Codeine     VITALS:  Blood pressure 138/84, pulse 72, temperature 97.9 F (36.6 C), temperature source Oral, resp. rate 12, height 5\' 8"  (1.727 m), weight 92.2 kg (203 lb 4.2 oz), SpO2 97 %.  PHYSICAL EXAMINATION:  GENERAL:  59 y.o.-year-old patient lying in the bed, very uncomfortable LUNGS: Normal breath sounds bilaterally, no wheezing, rales,rhonchi or crepitation. No use of accessory muscles of respiration.  CARDIOVASCULAR: S1, S2 normal. No murmurs, rubs, or gallops.  ABDOMEN: Soft, nontender, nondistended. Bowel sounds present. No organomegaly or mass. No guarding no rebound EXTREMITIES: No pedal edema, cyanosis, or clubbing.  NEUROLOGIC: Cranial nerves II through XII are intact. Muscle strength 5/5 in all extremities. Sensation intact. Gait not checked.  PSYCHIATRIC: The patient is alert and oriented x 3. Anxious and uncomfortable SKIN: No obvious rash, lesion, or ulcer.    LABORATORY PANEL:   CBC  Recent Labs Lab 07/18/15 0605  WBC 7.1  HGB 13.8  HCT 42.0  PLT 192    ------------------------------------------------------------------------------------------------------------------  Chemistries   Recent Labs Lab 07/18/15 0605  NA 135  K 3.3*  CL 102  CO2 27  GLUCOSE 134*  BUN 9  CREATININE 1.21  CALCIUM 8.8*  AST 15  ALT 16*  ALKPHOS 41  BILITOT 0.5   ------------------------------------------------------------------------------------------------------------------  Cardiac Enzymes  Recent Labs Lab 07/15/15 2024  TROPONINI <0.03   ------------------------------------------------------------------------------------------------------------------  RADIOLOGY:  No results found.  EKG:   Orders placed or performed during the hospital encounter of 07/15/15  . ED EKG within 10 minutes  . ED EKG within 10 minutes  . EKG 12-Lead  . EKG 12-Lead    ASSESSMENT AND PLAN:   1. Acute pancreatitis - Due to elevated triglycerides. Has been on insulin drip for 48 hours now triglycerides 700. We'll transition to oral fiber therapy. Continue hydration - Gallbladder normal on abdominal ultrasound - Lipase is normal. Abdominal pain resolved. Advance diet  2. sepsis: resolved. More likely due to pancreatitis. Blood cultures negative.  3. Hypertension: Controlled - Continue home medications  4. diabetes mellitus type 2: Continue sliding scale. Discontinue insulin drip. A1c 9.5. Poor control in outpatient setting.  All the records are reviewed and case discussed with Care Management/Social Workerr. Management plans discussed with the patient, family and they are in agreement.  CODE STATUS: Full  TOTAL TIME TAKING CARE OF THIS PATIENT:35 minutes.  Greater than 50% of time spent in care coordination and counseling. POSSIBLE D/C IN 1 DAYS, DEPENDING ON CLINICAL CONDITION.   Myrtis Ser M.D on 07/18/2015 at 4:45 PM  Between 7am to 6pm - Pager - [phone]  After 6pm go  to [email or web address] - password [email or web address] [facility]  [facility]  Office  [phone]  CC: Primary care physician; [name], MD

## 2015-07-19 ENCOUNTER — Telehealth: Payer: Self-pay | Admitting: Family Medicine

## 2015-07-19 DIAGNOSIS — E781 Pure hyperglyceridemia: Secondary | ICD-10-CM | POA: Diagnosis present

## 2015-07-19 LAB — COMPREHENSIVE METABOLIC PANEL
ALT: 16 U/L — ABNORMAL LOW (ref 17–63)
AST: 14 U/L — ABNORMAL LOW (ref 15–41)
Albumin: 3.3 g/dL — ABNORMAL LOW (ref 3.5–5.0)
Alkaline Phosphatase: 44 U/L (ref 38–126)
Anion gap: 5 (ref 5–15)
BUN: 10 mg/dL (ref 6–20)
CO2: 29 mmol/L (ref 22–32)
Calcium: 9.5 mg/dL (ref 8.9–10.3)
Chloride: 104 mmol/L (ref 101–111)
Creatinine, Ser: 1.26 mg/dL — ABNORMAL HIGH (ref 0.61–1.24)
GFR calc Af Amer: >60 mL/min (ref >60)
GFR calc non Af Amer: >60 mL/min (ref >60)
Glucose, Bld: 159 mg/dL — ABNORMAL HIGH (ref 65–99)
Potassium: 4.5 mmol/L (ref 3.5–5.1)
Sodium: 138 mmol/L (ref 135–145)
Total Bilirubin: 0.7 mg/dL (ref 0.3–1.2)
Total Protein: 7.2 g/dL (ref 6.5–8.1)

## 2015-07-19 LAB — CBC
HCT: 43.2 % (ref 40.0–52.0)
Hemoglobin: 14.1 g/dL (ref 13.0–18.0)
MCH: 27.3 pg (ref 26.0–34.0)
MCHC: 32.7 g/dL (ref 32.0–36.0)
MCV: 83.6 fL (ref 80.0–100.0)
Platelets: 216 10*3/uL (ref 150–440)
RBC: 5.16 MIL/uL (ref 4.40–5.90)
RDW: 15.5 % — ABNORMAL HIGH (ref 11.5–14.5)
WBC: 5.5 10*3/uL (ref 3.8–10.6)

## 2015-07-19 LAB — LIPID PANEL
Cholesterol: 324 mg/dL — ABNORMAL HIGH (ref 0–200)
HDL: 16 mg/dL — ABNORMAL LOW (ref >40)
LDL Cholesterol: UNDETERMINED mg/dL (ref 0–99)
Total CHOL/HDL Ratio: 20.3 RATIO
Triglycerides: 986 mg/dL — ABNORMAL HIGH (ref <150)
VLDL: UNDETERMINED mg/dL (ref 0–40)

## 2015-07-19 LAB — GLUCOSE, CAPILLARY
Glucose-Capillary: 177 mg/dL — ABNORMAL HIGH (ref 65–99)
Glucose-Capillary: 164 mg/dL — ABNORMAL HIGH (ref 65–99)

## 2015-07-19 MED ORDER — GLIPIZIDE 10 MG PO TABS: 10.0000 mg | ORAL_TABLET | Freq: Every day | ORAL | Status: DC

## 2015-07-19 MED ORDER — OMEGA-3-ACID ETHYL ESTERS 1 G PO CAPS: 1.0000 g | ORAL_CAPSULE | Freq: Two times a day (BID) | ORAL | Status: DC

## 2015-07-19 MED ORDER — GEMFIBROZIL 600 MG PO TABS: 600.0000 mg | ORAL_TABLET | Freq: Two times a day (BID) | ORAL | Status: DC

## 2015-07-19 MED ORDER — METFORMIN HCL 1000 MG PO TABS: 1000.0000 mg | ORAL_TABLET | Freq: Two times a day (BID) | ORAL | Status: DC

## 2015-07-19 MED ORDER — OMEGA-3-ACID ETHYL ESTERS 1 G PO CAPS
1.0000 g | ORAL_CAPSULE | Freq: Two times a day (BID) | ORAL | Status: DC
  Administered 2015-07-19: 1 g via ORAL
  Filled 2015-07-19: qty 1

## 2015-07-19 MED ORDER — PRAVASTATIN SODIUM 40 MG PO TABS: 40.0000 mg | ORAL_TABLET | Freq: Every day | ORAL | Status: DC

## 2015-07-19 NOTE — Progress Notes (Signed)
Patient received discharge instructions, pt verbalized understanding. IV was removed with no signs of infection. Dressing clean, dry intact. No skin tears or wounds present. Prescription was printed and given to patient. Patient was escorted out with staff member via wheelchair via private auto. No further needs from care management team.  

## 2015-07-19 NOTE — Progress Notes (Signed)
Inpatient Diabetes Program Recommendations  AACE/ADA: New Consensus Statement on Inpatient Glycemic Control (2015)  Target Ranges:  Prepandial:   less than 140 mg/dL      Peak postprandial:   less than 180 mg/dL (1-2 hours)      Critically ill patients:  140 - 180 mg/dL   Review of Glycemic Control:  Results for Jeffrey, [name] (MRN [id number]) as of 07/19/2015 09:24  Ref. Range 07/18/2015 16:13 07/18/2015 21:20 07/19/2015 05:04 07/19/2015 07:30  Glucose-Capillary Latest Ref Range: 65-99 mg/dL 126 (H) 190 (H)  177 (H)    Diabetes history: Type 2 diabetes Outpatient Diabetes medications: Canagliflozin-Metformin [phone] mg daily Current orders for Inpatient glycemic control:  Novolog moderate tid with meals and HS  Inpatient Diabetes Program Recommendations:    Please consider adding Levemir 16 units daily.  Patient may need insulin at discharge?   Thanks, Adah Perl, RN, BC-ADM Inpatient Diabetes Coordinator Pager [phone] (8a-5p)

## 2015-07-19 NOTE — Progress Notes (Signed)
Patient was A&O X4 with wife at the bedside at the beginning of the shift. He denied pain, N&V and was hemodynamically stable overnight. He remained NSR with VS WDL for patient. Patient needed items were kept within patient's reach and was assisted as needed. Patient rested for most of the night.

## 2015-07-19 NOTE — Care Management (Signed)
Patient transferred from ICU to 2A.   He was admitted with acute pancreatitis and found to have triglyceride level > 1000.  Required insulin drip.   Patient is independent, employed without issues accessing medical care.

## 2015-07-19 NOTE — Discharge Instructions (Signed)
You have new prescriptions for Pravachol and Omega-3's to take with fenofibrate to lower triglycerides. You have a new prescription for metformin and glipizide to take INSTEAD of your former metformin-canagliflozin combination.  That medication could have contributed to increasing your cholesterol and triglycerides. You should has your primary care doctor about going to a lipid clinic such as the one with [facility], phone number is [phone]. You should see a dietitian.  DIET:  Diabetic diet and Low fat, Low cholesterol diet. Avoid processed, sweet or fatty foods.  Focus on Fruits and vegetables.  DISCHARGE CONDITION:  Stable  ACTIVITY:  Activity as tolerated  OXYGEN:  Home Oxygen: No.   Oxygen Delivery: room air  DISCHARGE LOCATION:  home   If you experience worsening of your admission symptoms, develop shortness of breath, life threatening emergency, suicidal or homicidal thoughts you must seek medical attention immediately by calling 911 or calling your MD immediately  if symptoms less severe.  You Must read complete instructions/literature along with all the possible adverse reactions/side effects for all the Medicines you take and that have been prescribed to you. Take any new Medicines after you have completely understood and accpet all the possible adverse reactions/side effects.   Please note  You were cared for by a hospitalist during your hospital stay. If you have any questions about your discharge medications or the care you received while you were in the hospital after you are discharged, you can call the unit and asked to speak with the hospitalist on call if the hospitalist that took care of you is not available. Once you are discharged, your primary care physician will handle any further medical issues. Please note that NO REFILLS for any discharge medications will be authorized once you are discharged, as it is imperative that you return to your primary care  physician (or establish a relationship with a primary care physician if you do not have one) for your aftercare needs so that they can reassess your need for medications and monitor your lab values.

## 2015-07-19 NOTE — Telephone Encounter (Signed)
Pt is being discharged from [facility] for chest and back pain today.  I have scheduled a hospital follow up appointment /MW

## 2015-07-20 ENCOUNTER — Encounter: Payer: Self-pay | Admitting: Family Medicine

## 2015-07-20 ENCOUNTER — Ambulatory Visit (INDEPENDENT_AMBULATORY_CARE_PROVIDER_SITE_OTHER): Payer: 59 | Admitting: Family Medicine

## 2015-07-20 VITALS — BP 114/76 | HR 80 | Resp 14 | Wt 206.0 lb

## 2015-07-20 DIAGNOSIS — E1169 Type 2 diabetes mellitus with other specified complication: Secondary | ICD-10-CM | POA: Diagnosis not present

## 2015-07-20 DIAGNOSIS — E785 Hyperlipidemia, unspecified: Secondary | ICD-10-CM | POA: Diagnosis not present

## 2015-07-20 DIAGNOSIS — K859 Acute pancreatitis without necrosis or infection, unspecified: Secondary | ICD-10-CM

## 2015-07-20 DIAGNOSIS — Z09 Encounter for follow-up examination after completed treatment for conditions other than malignant neoplasm: Secondary | ICD-10-CM

## 2015-07-20 MED ORDER — OMEGA-3-ACID ETHYL ESTERS 1 G PO CAPS: 1.0000 g | ORAL_CAPSULE | Freq: Two times a day (BID) | ORAL | Status: DC

## 2015-07-20 MED ORDER — GLIPIZIDE 10 MG PO TABS: 5.0000 mg | ORAL_TABLET | Freq: Every day | ORAL | Status: DC

## 2015-07-20 MED ORDER — PRAVASTATIN SODIUM 40 MG PO TABS: 40.0000 mg | ORAL_TABLET | Freq: Every day | ORAL | Status: DC

## 2015-07-20 MED ORDER — METFORMIN HCL 1000 MG PO TABS: 1000.0000 mg | ORAL_TABLET | Freq: Two times a day (BID) | ORAL | Status: DC

## 2015-07-20 NOTE — Discharge Summary (Signed)
[facility] [place]   PATIENT NAME: Jeffrey [name]    MR#:  [id number]  DATE OF BIRTH:  11-07-1955  DATE OF ADMISSION:  07/15/2015 ADMITTING PHYSICIAN: Harrie Foreman, MD  DATE OF DISCHARGE: 07/19/2015  PRIMARY CARE PHYSICIAN: [name], MD    ADMISSION DIAGNOSIS:  Pancreatitis [K85.9] Generalized abdominal pain [R10.84] Chest pain [R07.9]  DISCHARGE DIAGNOSIS:  Active Problems:   Pancreatitis   Hypertriglyceridemia   SECONDARY DIAGNOSIS:   Past Medical History  Diagnosis Date  . Plantar fasciitis   . Cholecystitis   . Actinic keratosis   . Peyronie's disease   . Erectile dysfunction   . Diverticulosis   . Obesity   . Hyperlipidemia   . Diabetes mellitus ([place])   . Myalgia   . Seasonal allergies   . GERD (gastroesophageal reflux disease)   . Hypertension   . Kidney stones     HOSPITAL COURSE:    1. Acute pancreatitis: Admission lipase 39, CT scan shows little edema around the pancreas. Due to elevated triglycerides. He was placed on insulin drip with D5 for 48 hours and triglycerides decreased to 700s. He was then transitioned to oral therapies including fenofibrate, Pravachol and Omega three fatty acids. I also discontinued canagliflozin as this medication can potentially increase lipids. He did have a bump in his triglycerides prior to discharge but did not have recurrence of pancreatitis. Gallbladder normal on abdominal ultrasound. He received extensive counseling on lipid control. He'll follow-up with his primary care provider. He was provided with the contact information for a lipid management clinic in [place] and will discuss this with his primary care provider. At the time of discharge she is tolerating regular diet with no pain.  2. sepsis: Ruled out. More likely due to pancreatitis. Blood cultures negative.  3. Hypertension: Controlled. Continue amlodipine, losartan  4. diabetes  mellitus type 2: A1c is 9.5 getting poor outpatient control. He received counseling regarding diet and lifestyle. His medications were changed. Canagliflozin discontinued. Glipizide started. Metformin will be continued at 1009 g twice a day. He'll follow-up with his primary care provider.  DISCHARGE CONDITIONS:   Stable  CONSULTS OBTAINED:     None  DRUG ALLERGIES:   Allergies  Allergen Reactions  . Codeine     DISCHARGE MEDICATIONS:   Discharge Medication List as of 07/19/2015  3:02 PM    START taking these medications   Details  glipiZIDE (GLUCOTROL) 10 MG tablet Take 1 tablet (10 mg total) by mouth daily before breakfast., Starting 07/19/2015, Until Discontinued, Print    metFORMIN (GLUCOPHAGE) 1000 MG tablet Take 1 tablet (1,000 mg total) by mouth 2 (two) times daily with a meal., Starting 07/19/2015, Until Discontinued, Print    omega-3 acid ethyl esters (LOVAZA) 1 G capsule Take 1 capsule (1 g total) by mouth 2 (two) times daily., Starting 07/19/2015, Until Discontinued, Print    pravastatin (PRAVACHOL) 40 MG tablet Take 1 tablet (40 mg total) by mouth daily at 6 PM., Starting 07/19/2015, Until Discontinued, Print      CONTINUE these medications which have NOT CHANGED   Details  amLODipine (NORVASC) 10 MG tablet Take 1 tablet by mouth  daily, Normal    aspirin 81 MG tablet Take 81 mg by mouth daily., Until Discontinued, Historical Med    fenofibrate 160 MG tablet Take 1 tablet by mouth  daily, Normal    fluticasone (FLONASE) 50 MCG/ACT nasal spray Place 1 spray into both nostrils  daily., Starting 05/04/2015, Until Discontinued, Normal    gabapentin (NEURONTIN) 100 MG capsule Take 200 mg by mouth every morning., Until Discontinued, Historical Med    glucose blood test strip Check sugar once daily, Normal    loratadine (CLARITIN) 10 MG tablet Take 10 mg by mouth daily., Until Discontinued, Historical Med    losartan (COZAAR) 100 MG tablet Take 1 tablet by mouth  daily,  Normal    omeprazole (PRILOSEC) 20 MG capsule Take 20 mg by mouth daily., Until Discontinued, Historical Med    sildenafil (VIAGRA) 100 MG tablet Take 100 mg by mouth daily as needed for erectile dysfunction., Until Discontinued, Historical Med    traMADol (ULTRAM) 50 MG tablet Take by mouth every 6 (six) hours as needed., Until Discontinued, Historical Med      STOP taking these medications     Canagliflozin-Metformin HCl [phone] MG TABS      Lancet Devices (ACCU-CHEK SOFTCLIX) lancets          DISCHARGE INSTRUCTIONS:    Carbohydrate modified, low-fat diet. No home health needs. Follow-up with primary care provider. Stable condition  If you experience worsening of your admission symptoms, develop shortness of breath, life threatening emergency, suicidal or homicidal thoughts you must seek medical attention immediately by calling 911 or calling your MD immediately  if symptoms less severe.  You Must read complete instructions/literature along with all the possible adverse reactions/side effects for all the Medicines you take and that have been prescribed to you. Take any new Medicines after you have completely understood and accept all the possible adverse reactions/side effects.   Please note  You were cared for by a hospitalist during your hospital stay. If you have any questions about your discharge medications or the care you received while you were in the hospital after you are discharged, you can call the unit and asked to speak with the hospitalist on call if the hospitalist that took care of you is not available. Once you are discharged, your primary care physician will handle any further medical issues. Please note that NO REFILLS for any discharge medications will be authorized once you are discharged, as it is imperative that you return to your primary care physician (or establish a relationship with a primary care physician if you do not have one) for your aftercare needs  so that they can reassess your need for medications and monitor your lab values.    Today   CHIEF COMPLAINT:   Chief Complaint  Patient presents with  . Chest Pain    Pt c/o midsternal chest pain radiating around to right upper back since 430 pm today. Denies SOB. +nausea at times. Denies vomiting. Denies diaphoresis.    HISTORY OF PRESENT ILLNESS:  The patient presents to the emergency department complaining of abdominal pain and nausea that began earlier this afternoon. He states the pain is located in his upper abdomen and radiates to his right chest into his right shoulder blade. He denies associated shortness of breath. Emesis has been nonbloody and nonbilious denies fever as well. In the emergency department the patient does not have an elevated lipase which prompted emergency [place] staff to call for admission.  VITAL SIGNS:  Blood pressure 125/80, pulse 68, temperature 97.8 F (36.6 C), temperature source Oral, resp. rate 18, height 5\' 8"  (1.727 m), weight 89.676 kg (197 lb 11.2 oz), SpO2 96 %.  I/O:  No intake or output data in the 24 hours ending 07/20/15 1633  PHYSICAL EXAMINATION:  GENERAL:  59 y.o.-year-old patient lying in the bed with no acute distress.  LUNGS: Normal breath sounds bilaterally, no wheezing, rales,rhonchi or crepitation. No use of accessory muscles of respiration.  CARDIOVASCULAR: S1, S2 normal. No murmurs, rubs, or gallops.  ABDOMEN: Soft, non-tender, non-distended. Bowel sounds present. No organomegaly or mass. No guarding no rebound EXTREMITIES: No pedal edema, cyanosis, or clubbing.  NEUROLOGIC: Cranial nerves II through XII are intact. Muscle strength 5/5 in all extremities. Sensation intact. Gait not checked.  PSYCHIATRIC: The patient is alert and oriented x 3.  SKIN: No obvious rash, lesion, or ulcer.   DATA REVIEW:   CBC  Recent Labs Lab 07/19/15 0504  WBC 5.5  HGB 14.1  HCT 43.2  PLT 216    Chemistries   Recent Labs Lab  07/19/15 0504  NA 138  K 4.5  CL 104  CO2 29  GLUCOSE 159*  BUN 10  CREATININE 1.26*  CALCIUM 9.5  AST 14*  ALT 16*  ALKPHOS 44  BILITOT 0.7    Cardiac Enzymes  Recent Labs Lab 07/15/15 2024  TROPONINI <0.03    Microbiology Results  Results for orders placed or performed during the hospital encounter of 07/15/15  Culture, blood (routine x 2)     Status: None (Preliminary result)   Collection Time: 07/16/15  1:20 AM  Result Value Ref Range Status   Specimen Description BLOOD LEFT ASSIST CONTROL  Final   Special Requests BOTTLES DRAWN AEROBIC AND ANAEROBIC 4CC  Final   Culture NO GROWTH 4 DAYS  Final   Report Status PENDING  Incomplete  Culture, blood (routine x 2)     Status: None (Preliminary result)   Collection Time: 07/16/15  1:20 AM  Result Value Ref Range Status   Specimen Description BLOOD RIGHT HAND  Final   Special Requests BOTTLES DRAWN AEROBIC AND ANAEROBIC 4CC  Final   Culture NO GROWTH 4 DAYS  Final   Report Status PENDING  Incomplete  MRSA PCR Screening     Status: None   Collection Time: 07/16/15 10:35 AM  Result Value Ref Range Status   MRSA by PCR NEGATIVE NEGATIVE Final    Comment:        The GeneXpert MRSA Assay (FDA approved for NASAL specimens only), is one component of a comprehensive MRSA colonization surveillance program. It is not intended to diagnose MRSA infection nor to guide or monitor treatment for MRSA infections.     RADIOLOGY:  No results found.  EKG:   Orders placed or performed during the hospital encounter of 07/15/15  . ED EKG within 10 minutes  . ED EKG within 10 minutes  . EKG 12-Lead  . EKG 12-Lead  . EKG      Management plans discussed with the patient, family and they are in agreement.  CODE STATUS:  Advance Directive Documentation        Most Recent Value   Type of Advance Directive  Healthcare Power of Attorney, Living will   Pre-existing out of facility DNR order (yellow form or pink MOST form)      "MOST" Form in Place?        TOTAL TIME TAKING CARE OF THIS PATIENT: 35 minutes.  Greater than 50% of time spent in care coordination and counseling.  Myrtis Ser M.D on 07/20/2015 at 4:33 PM  Between 7am to 6pm - Pager - [phone]  After 6pm go to [email or web address] - [facility]  Office  [phone]  CC: Primary care physician; [name], MD

## 2015-07-20 NOTE — Telephone Encounter (Signed)
Pt is been seen today-aa

## 2015-07-20 NOTE — Progress Notes (Signed)
Patient ID: Jeffrey [name], male   DOB: 1956-05-13, 59 y.o.   MRN: [id number]   Jeffrey [name]  MRN: [id number] DOB: 02/17/1956  Subjective:  HPI  1. Hospital discharge follow-up Patient is a 59 year old male who presents for follow up after his recent hospitalization for non alcoholic pancreatitis. He was admitted into [facility] on 07/15/15.  Two days later he was admitted into ICU and was ultimately d/c'ed on 12/7. Patient had abdominal pain radiating to his right chest.  His hospital course included treatment with insulin drip due to his elevated lipase, abnormal lipids and abnormal liver enzymes at the time of admission.  Patient had CXR, CT and ultrasound, all of which were negative.  Patient was given prescription for Omega 3 and Metformin at the time of his d/c.  Patient states he was advised to go on insulin instead of the Invokamet.  2. Acute pancreatitis, unspecified pancreatitis type    Patient Active Problem List   Diagnosis Date Noted  . Hypertriglyceridemia 07/19/2015  . Pancreatitis 07/16/2015  . ED (erectile dysfunction) of organic origin 04/18/2015  . Hypertension 12/06/2014  . Seasonal allergies 12/06/2014  . Cholecystitis 12/06/2014  . Diverticulosis 12/06/2014  . GERD (gastroesophageal reflux disease) 12/06/2014  . Diabetes mellitus ([place]) 12/06/2014  . Plantar fasciitis 12/06/2014  . Actinic keratosis 12/06/2014  . Peyronie's disease 12/06/2014  . Erectile dysfunction 12/06/2014  . Obesity 12/06/2014  . Hyperlipidemia 12/06/2014  . Myalgia 12/06/2014    Past Medical History  Diagnosis Date  . Plantar fasciitis   . Cholecystitis   . Actinic keratosis   . Peyronie's disease   . Erectile dysfunction   . Diverticulosis   . Obesity   . Hyperlipidemia   . Diabetes mellitus ([place])   . Myalgia   . Seasonal allergies   . GERD (gastroesophageal reflux disease)   . Hypertension   . Kidney stones     Social History   Social History  . Marital Status:  Married    Spouse Name: N/A  . Number of Children: N/A  . Years of Education: N/A   Occupational History  . Not on file.   Social History Main Topics  . Smoking status: Former Smoker -- 1.50 packs/day    Types: Cigarettes    Quit date: 08/11/1984  . Smokeless tobacco: Never Used  . Alcohol Use: No  . Drug Use: No  . Sexual Activity: Yes   Other Topics Concern  . Not on file   Social History Narrative    Outpatient Prescriptions Prior to Visit  Medication Sig Dispense Refill  . amLODipine (NORVASC) 10 MG tablet Take 1 tablet by mouth  daily 90 tablet 3  . aspirin 81 MG tablet Take 81 mg by mouth daily.    . fenofibrate 160 MG tablet Take 1 tablet by mouth  daily 90 tablet 2  . fluticasone (FLONASE) 50 MCG/ACT nasal spray Place 1 spray into both nostrils daily. 16 g 12  . gabapentin (NEURONTIN) 100 MG capsule Take 200 mg by mouth every morning.    Marland Kitchen glipiZIDE (GLUCOTROL) 10 MG tablet Take 1 tablet (10 mg total) by mouth daily before breakfast. 30 tablet 0  . glucose blood test strip Check sugar once daily 100 each 3  . loratadine (CLARITIN) 10 MG tablet Take 10 mg by mouth daily.    Marland Kitchen losartan (COZAAR) 100 MG tablet Take 1 tablet by mouth  daily 90 tablet 3  . metFORMIN (GLUCOPHAGE) 1000 MG tablet Take 1  tablet (1,000 mg total) by mouth 2 (two) times daily with a meal. 60 tablet 0  . omega-3 acid ethyl esters (LOVAZA) 1 G capsule Take 1 capsule (1 g total) by mouth 2 (two) times daily. 60 capsule 0  . omeprazole (PRILOSEC) 20 MG capsule Take 20 mg by mouth daily.    . pravastatin (PRAVACHOL) 40 MG tablet Take 1 tablet (40 mg total) by mouth daily at 6 PM. 30 tablet 1  . sildenafil (VIAGRA) 100 MG tablet Take 100 mg by mouth daily as needed for erectile dysfunction.    . traMADol (ULTRAM) 50 MG tablet Take by mouth every 6 (six) hours as needed.     No facility-administered medications prior to visit.    Allergies  Allergen Reactions  . Codeine     Review of Systems    Constitutional: Negative for fever, chills and malaise/fatigue.  Eyes: Negative.   Respiratory: Negative for cough, shortness of breath and wheezing.   Cardiovascular: Negative for chest pain, palpitations, orthopnea and leg swelling.  Gastrointestinal: Negative for heartburn, nausea, vomiting, abdominal pain, diarrhea, constipation, blood in stool and melena.  Musculoskeletal: Positive for back pain.       This is improved. He continues to have mild discomfort with deep palpation just the right of about T4 .  Neurological: Negative for dizziness, weakness and headaches.  Endo/Heme/Allergies: Negative.   Psychiatric/Behavioral: Negative.    Objective:  BP 114/76 mmHg  Pulse 80  Resp 14  Wt 206 lb (93.441 kg)  Physical Exam  Constitutional: He is oriented to person, place, and time and well-developed, well-nourished, and in no distress.  HENT:  Head: Normocephalic and atraumatic.  Right Ear: External ear normal.  Left Ear: External ear normal.  Nose: Nose normal.  Eyes: Conjunctivae are normal.  Neck: Neck supple.  Cardiovascular: Normal rate, regular rhythm and normal heart sounds.   Pulmonary/Chest: Effort normal and breath sounds normal.  Abdominal: Soft.  Neurological: He is alert and oriented to person, place, and time. Gait normal.  Skin: Skin is warm and dry.  Psychiatric: Mood, memory, affect and judgment normal.    Assessment and Plan :  1. Hospital discharge follow-up   2. Acute pancreatitis, unspecified pancreatitis type Could be secondary to diabetes, diabetic drugs, hyperlipidemia area and not secondary to alcohol in this patient. Gallstone pancreatitis. Stop diabetic oral drugs and start insulin.  3. Hyperlipidemia Consider Fennofibrate. - omega-3 acid ethyl esters (LOVAZA) 1 G capsule; Take 1 capsule (1 g total) by mouth 2 (two) times daily.  Dispense: 60 capsule; Refill: 12 - pravastatin (PRAVACHOL) 40 MG tablet; Take 1 tablet (40 mg total) by mouth daily  at 6 PM.  Dispense: 30 tablet; Refill: 12  4. Type 2 diabetes mellitus with other specified complication, without long-term current use of insulin (HCC)  - glipiZIDE (GLUCOTROL) 10 MG tablet; Take 0.5 tablets (5 mg total) by mouth daily before breakfast.  Dispense: 30 tablet; Refill: 12 - metFORMIN (GLUCOPHAGE) 1000 MG tablet; Take 1 tablet (1,000 mg total) by mouth 2 (two) times daily with a meal.  Dispense: 60 tablet; Refill: 12 5. Obesity Diet,exercise and weight loss discussed at length. I have done the exam and reviewed the above chart and it is accurate to the best of my knowledge.    Miguel Aschoff MD [facility] 07/20/2015 3:21 PM

## 2015-07-21 LAB — CULTURE, BLOOD (ROUTINE X 2)
Culture: NO GROWTH
Culture: NO GROWTH

## 2015-07-27 ENCOUNTER — Ambulatory Visit (INDEPENDENT_AMBULATORY_CARE_PROVIDER_SITE_OTHER): Payer: 59 | Admitting: Family Medicine

## 2015-07-27 VITALS — BP 118/70 | HR 78 | Temp 97.7°F | Resp 16 | Wt 212.0 lb

## 2015-07-27 DIAGNOSIS — E119 Type 2 diabetes mellitus without complications: Secondary | ICD-10-CM

## 2015-07-27 DIAGNOSIS — K859 Acute pancreatitis without necrosis or infection, unspecified: Secondary | ICD-10-CM

## 2015-07-27 DIAGNOSIS — E785 Hyperlipidemia, unspecified: Secondary | ICD-10-CM | POA: Diagnosis not present

## 2015-07-27 NOTE — Progress Notes (Signed)
Patient ID: Jeffrey [name], male   DOB: Jun 24, 1956, 59 y.o.   MRN: [id number]    Subjective:  HPI Pt is here for a 1 week follow up. At [place] he had been hospitalized for pancreatitis. He reports that he is no longer having abdominal pain and is feeling much better. He says his blood sugars are running 120-130's but this morning it was higher at 188 because he had a big dinner last night. 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 He is doing ok on Pravastatin and fenofibrate.   Prior to Admission medications   Medication Sig Start Date End Date Taking? Authorizing Provider  amLODipine (NORVASC) 10 MG tablet Take 1 tablet by mouth  daily 06/03/15  Yes Adelai Achey Maceo Pro., MD  aspirin 81 MG tablet Take 81 mg by mouth daily.   Yes Historical Provider, MD  fenofibrate 160 MG tablet Take 1 tablet by mouth  daily 04/13/15  Yes Dorthula Bier Maceo Pro., MD  fluticasone [facility]) 50 MCG/ACT nasal spray Place 1 spray into both nostrils daily. 05/04/15  Yes Caylea Foronda Maceo Pro., MD  gabapentin (NEURONTIN) 100 MG capsule Take 200 mg by mouth every morning.   Yes Historical Provider, MD  glipiZIDE (GLUCOTROL) 10 MG tablet Take 0.5 tablets (5 mg total) by mouth daily before breakfast. 07/20/15  Yes Jerrol Banana., MD  glucose blood test strip Check sugar once daily 04/11/15  Yes Jerrol Banana., MD  loratadine (CLARITIN) 10 MG tablet Take 10 mg by mouth daily.   Yes Historical Provider, MD  losartan (COZAAR) 100 MG tablet Take 1 tablet by mouth  daily 06/03/15  Yes Terin Dierolf Maceo Pro., MD  metFORMIN (GLUCOPHAGE) 1000 MG tablet Take 1 tablet (1,000 mg total) by mouth 2 (two) times daily with a meal. 07/20/15  Yes Jerrol Banana., MD  omega-3 acid ethyl esters (LOVAZA) 1 G capsule Take 1 capsule (1 g total) by mouth 2 (two) times daily. 07/20/15  Yes Hades Mathew Maceo Pro., MD  omeprazole (PRILOSEC) 20 MG capsule Take 20  mg by mouth daily.   Yes Historical Provider, MD  pravastatin (PRAVACHOL) 40 MG tablet Take 1 tablet (40 mg total) by mouth daily at 6 PM. 07/20/15  Yes Jerrol Banana., MD  sildenafil (VIAGRA) 100 MG tablet Take 100 mg by mouth daily as needed for erectile dysfunction.   Yes Historical Provider, MD  traMADol (ULTRAM) 50 MG tablet Take by mouth every 6 (six) hours as needed.   Yes Historical Provider, MD    Patient Active Problem List   Diagnosis Date Noted  . Hypertriglyceridemia 07/19/2015  . Pancreatitis 07/16/2015  . ED (erectile dysfunction) of organic origin 04/18/2015  . Hypertension 12/06/2014  . Seasonal allergies 12/06/2014  . Cholecystitis 12/06/2014  . Diverticulosis 12/06/2014  . GERD (gastroesophageal reflux disease) 12/06/2014  . Diabetes mellitus ([place]) 12/06/2014  . Plantar fasciitis 12/06/2014  . Actinic keratosis 12/06/2014  . Peyronie's disease 12/06/2014  . Erectile dysfunction 12/06/2014  . Obesity 12/06/2014  . Hyperlipidemia 12/06/2014  . Myalgia 12/06/2014    Past Medical History  Diagnosis Date  . Plantar fasciitis   . Cholecystitis   . Actinic keratosis   . Peyronie's disease   . Erectile dysfunction   . Diverticulosis   . Obesity   . Hyperlipidemia   . Diabetes mellitus ([place])   . Myalgia   .  Seasonal allergies   . GERD (gastroesophageal reflux disease)   . Hypertension   . Kidney stones     Social History   Social History  . Marital Status: Married    Spouse Name: N/A  . Number of Children: N/A  . Years of Education: N/A   Occupational History  . Not on file.   Social History Main Topics  . Smoking status: Former Smoker -- 1.50 packs/day    Types: Cigarettes    Quit date: 08/11/1984  . Smokeless tobacco: Never Used  . Alcohol Use: No  . Drug Use: No  . Sexual Activity: Yes   Other Topics Concern  . Not on file   Social History Narrative    Allergies  Allergen Reactions  . Codeine     Review of Systems    Constitutional: Negative.   HENT: Negative.   Eyes: Negative.   Respiratory: Negative.   Cardiovascular: Negative.   Gastrointestinal: Negative.   Genitourinary: Negative.   Musculoskeletal: Positive for back pain.  Skin: Negative.   Neurological: Negative.   Endo/Heme/Allergies: Negative.   Psychiatric/Behavioral: Negative.     Immunization History  Administered Date(s) Administered  . Influenza,inj,Quad PF,36+ Mos 04/20/2015  . Pneumococcal Polysaccharide-23 06/28/2010  . Tdap 10/30/2007   Objective:  BP 118/70 mmHg  Pulse 78  Temp(Src) 97.7 F (36.5 C) (Oral)  Resp 16  Wt 212 lb (96.163 kg)  Physical Exam  Constitutional: He is oriented to person, place, and time and well-developed, well-nourished, and in no distress.  HENT:  Head: Normocephalic and atraumatic.  Right Ear: External ear normal.  Left Ear: External ear normal.  Nose: Nose normal.  Eyes: Conjunctivae and EOM are normal. Pupils are equal, round, and reactive to light.  Neck: Normal range of motion. Neck supple.  Cardiovascular: Normal rate, regular rhythm, normal heart sounds and intact distal pulses.   Pulmonary/Chest: Effort normal and breath sounds normal.  Abdominal: Soft. Bowel sounds are normal.  Neurological: He is alert and oriented to person, place, and time. He has normal reflexes. Gait normal. GCS score is 15.  Skin: Skin is warm and dry.  Psychiatric: Mood, memory, affect and judgment normal.    Lab Results  Component Value Date   WBC 5.5 07/19/2015   HGB 14.1 07/19/2015   HCT 43.2 07/19/2015   PLT 216 07/19/2015   GLUCOSE 159* 07/19/2015   CHOL 324* 07/19/2015   TRIG 986* 07/19/2015   HDL 16* 07/19/2015   LDLCALC UNABLE TO CALCULATE IF TRIGLYCERIDE OVER 400 mg/dL 07/19/2015   TSH 1.918 07/15/2015   PSA 1.1 08/22/2014   HGBA1C 9.5* 07/15/2015    CMP     Component Value Date/Time   NA 138 07/19/2015 0504   NA 136* 08/22/2014   K 4.5 07/19/2015 0504   CL 104 07/19/2015  0504   CO2 29 07/19/2015 0504   GLUCOSE 159* 07/19/2015 0504   BUN 10 07/19/2015 0504   BUN 17 08/22/2014   CREATININE 1.26* 07/19/2015 0504   CREATININE 1.1 08/22/2014   CALCIUM 9.5 07/19/2015 0504   PROT 7.2 07/19/2015 0504   ALBUMIN 3.3* 07/19/2015 0504   AST 14* 07/19/2015 0504   ALT 16* 07/19/2015 0504   ALKPHOS 44 07/19/2015 0504   BILITOT 0.7 07/19/2015 0504   GFRNONAA >60 07/19/2015 0504   GFRAA >60 07/19/2015 0504    Assesment and Plan :  1. Type 2 diabetes mellitus without complication, unspecified long term insulin use status (HCC)  - Comprehensive metabolic panel  2. Hyperlipidemia  - Lipid Panel With LDL/HDL Ratio - Comprehensive metabolic panel  3. Acute pancreatitis, unspecified pancreatitis type - Lipase Presumed diagnosis causing recent hospital admission. Needs to be addressed because it has such high mortality morbidity issues. Patient is a nonsmoker and it is not a gallstone pancreatitis. Will  aggressively treat  Lipids.May need to see a lipid specialist at some point in time. 4. Hypertension 5. Obesity Diet and exercise habits discussed at length during this visit. Would like to see the patient start by getting his weight down below 200 pounds. He is currently at 88 I have done the exam and reviewed the above chart and it is accurate to the best of my knowledge.  Patient was seen and examined by Dr. Miguel Aschoff, and noted scribed by Webb Laws, [place] MD [facility] 07/27/2015 3:51 PM

## 2015-07-28 LAB — GLUCOSE, CAPILLARY
Glucose-Capillary: 137 mg/dL — ABNORMAL HIGH (ref 65–99)
Glucose-Capillary: 117 mg/dL — ABNORMAL HIGH (ref 65–99)
Glucose-Capillary: 124 mg/dL — ABNORMAL HIGH (ref 65–99)
Glucose-Capillary: 125 mg/dL — ABNORMAL HIGH (ref 65–99)
Glucose-Capillary: 147 mg/dL — ABNORMAL HIGH (ref 65–99)
Glucose-Capillary: 178 mg/dL — ABNORMAL HIGH (ref 65–99)

## 2015-08-10 ENCOUNTER — Ambulatory Visit: Payer: 59 | Admitting: Family Medicine

## 2015-08-12 LAB — COMPREHENSIVE METABOLIC PANEL
ALT: 27 IU/L (ref 0–44)
AST: 20 IU/L (ref 0–40)
Albumin/Globulin Ratio: 1.6 (ref 1.1–2.5)
Albumin: 4.5 g/dL (ref 3.6–4.8)
Alkaline Phosphatase: 47 IU/L (ref 39–117)
BUN/Creatinine Ratio: 22 (ref 10–22)
BUN: 24 mg/dL (ref 8–27)
Bilirubin Total: 0.4 mg/dL (ref 0.0–1.2)
CO2: 21 mmol/L (ref 18–29)
Calcium: 9.6 mg/dL (ref 8.6–10.2)
Chloride: 98 mmol/L (ref 96–106)
Creatinine, Ser: 1.11 mg/dL (ref 0.76–1.27)
GFR calc Af Amer: 83 mL/min/{1.73_m2} (ref >59)
GFR calc non Af Amer: 72 mL/min/{1.73_m2} (ref >59)
Globulin, Total: 2.9 g/dL (ref 1.5–4.5)
Glucose: 175 mg/dL — ABNORMAL HIGH (ref 65–99)
Potassium: 4.2 mmol/L (ref 3.5–5.2)
Sodium: 137 mmol/L (ref 134–144)
Total Protein: 7.4 g/dL (ref 6.0–8.5)

## 2015-08-12 LAB — LIPASE: Lipase: 69 U/L — ABNORMAL HIGH (ref 0–59)

## 2015-08-12 LAB — LIPID PANEL WITH LDL/HDL RATIO
Cholesterol, Total: 156 mg/dL (ref 100–199)
HDL: 22 mg/dL — ABNORMAL LOW (ref >39)
Triglycerides: 445 mg/dL — ABNORMAL HIGH (ref 0–149)

## 2015-08-14 ENCOUNTER — Other Ambulatory Visit: Payer: Self-pay | Admitting: Family Medicine

## 2015-09-08 ENCOUNTER — Ambulatory Visit (INDEPENDENT_AMBULATORY_CARE_PROVIDER_SITE_OTHER): Payer: 59 | Admitting: Family Medicine

## 2015-09-08 ENCOUNTER — Encounter: Payer: Self-pay | Admitting: Family Medicine

## 2015-09-08 VITALS — BP 136/72 | HR 76 | Temp 98.2°F | Resp 16 | Wt 214.0 lb

## 2015-09-08 DIAGNOSIS — J329 Chronic sinusitis, unspecified: Secondary | ICD-10-CM | POA: Insufficient documentation

## 2015-09-08 DIAGNOSIS — J019 Acute sinusitis, unspecified: Secondary | ICD-10-CM

## 2015-09-08 MED ORDER — AMOXICILLIN 500 MG PO CAPS: 1000.0000 mg | ORAL_CAPSULE | Freq: Two times a day (BID) | ORAL | Status: AC

## 2015-09-08 NOTE — Progress Notes (Signed)
Patient ID: Jeffrey [name], male   DOB: [birth year], 60 y.o.   MRN: [id number]       Patient: Jeffrey [name] Male    DOB: 07/26/1956   60 y.o.   MRN: [id number] Visit Date: 09/08/2015  Today's Provider: Lelon Huh, MD   Chief Complaint  Patient presents with  . URI    X 10 days.    Subjective:    URI  This is a new problem. The current episode started 1 to 4 weeks ago. The problem has been unchanged. There has been no fever. Associated symptoms include congestion, coughing, headaches, rhinorrhea, sinus pain and wheezing. He has tried decongestant and antihistamine for the symptoms. The treatment provided no relief.  Patient reports that he developed his symptoms about 10 days ago. He reports that initally his symptoms were in his head, but now it has moved into his chest. Patient reports that he has bad "coughing spells" and he has noticed himself wheezing. Patient denies any fever since his symptoms started.      Allergies  Allergen Reactions  . Codeine    Previous Medications   AMLODIPINE (NORVASC) 10 MG TABLET    Take 1 tablet by mouth  daily   ASPIRIN 81 MG TABLET    Take 81 mg by mouth daily.   FENOFIBRATE 160 MG TABLET    Take 1 tablet by mouth  daily   FLUTICASONE (FLONASE) 50 MCG/ACT NASAL SPRAY    Place 1 spray into both nostrils daily.   GABAPENTIN (NEURONTIN) 100 MG CAPSULE    Take 200 mg by mouth every morning.   GLIPIZIDE (GLUCOTROL) 10 MG TABLET    Take 0.5 tablets (5 mg total) by mouth daily before breakfast.   GLUCOSE BLOOD (ONETOUCH VERIO) TEST STRIP    DX E11.9   LORATADINE (CLARITIN) 10 MG TABLET    Take 10 mg by mouth daily.   LOSARTAN (COZAAR) 100 MG TABLET    Take 1 tablet by mouth  daily   METFORMIN (GLUCOPHAGE) 1000 MG TABLET    Take 1 tablet (1,000 mg total) by mouth 2 (two) times daily with a meal.   OMEGA-3 ACID ETHYL ESTERS (LOVAZA) 1 G CAPSULE    Take 1 capsule (1 g total) by mouth 2 (two) times daily.   OMEPRAZOLE (PRILOSEC) 20 MG CAPSULE     Take 1 capsule by mouth  daily   PRAVASTATIN (PRAVACHOL) 40 MG TABLET    Take 1 tablet (40 mg total) by mouth daily at 6 PM.   SILDENAFIL (VIAGRA) 100 MG TABLET    Take 100 mg by mouth daily as needed for erectile dysfunction.   TRAMADOL (ULTRAM) 50 MG TABLET    Take by mouth every 6 (six) hours as needed.    Review of Systems  Constitutional: Positive for activity change and fatigue.  HENT: Positive for congestion, postnasal drip, rhinorrhea and sinus pressure.   Respiratory: Positive for cough and wheezing.   Neurological: Positive for headaches.    Social History  Substance Use Topics  . Smoking status: Former Smoker -- 1.50 packs/day    Types: Cigarettes    Quit date: 08/11/1984  . Smokeless tobacco: Never Used  . Alcohol Use: No   Objective:   BP 136/72 mmHg  Pulse 76  Temp(Src) 98.2 F (36.8 C)  Resp 16  Wt 214 lb (97.07 kg)  SpO2 97%  Physical Exam  General Appearance:    Alert, cooperative, no distress  HENT:   bilateral TM  normal without fluid or infection, neck without nodes, throat normal without erythema or exudate, frontal sinus tender and nasal mucosa pale and congested  Eyes:    PERRL, conjunctiva/corneas clear, EOM's intact       Lungs:     Clear to auscultation bilaterally, respirations unlabored  Heart:    Regular rate and rhythm  Neurologic:   Awake, alert, oriented x 3. No apparent focal neurological           defect.           Assessment & Plan:     1. Acute sinusitis, recurrence not specified, unspecified location  - amoxicillin (AMOXIL) 500 MG capsule; Take 2 capsules (1,000 mg total) by mouth 2 (two) times daily.  Dispense: 40 capsule; Refill: 0  Call if symptoms change or if not rapidly improving.          Lelon Huh, MD  [facility]

## 2015-09-15 ENCOUNTER — Other Ambulatory Visit: Payer: Self-pay | Admitting: Family Medicine

## 2015-09-15 DIAGNOSIS — E1169 Type 2 diabetes mellitus with other specified complication: Secondary | ICD-10-CM

## 2015-09-15 DIAGNOSIS — E785 Hyperlipidemia, unspecified: Secondary | ICD-10-CM

## 2015-09-15 MED ORDER — GLIPIZIDE 10 MG PO TABS: 5.0000 mg | ORAL_TABLET | Freq: Every day | ORAL | Status: DC

## 2015-09-15 MED ORDER — OMEGA-3-ACID ETHYL ESTERS 1 G PO CAPS: 1.0000 g | ORAL_CAPSULE | Freq: Two times a day (BID) | ORAL | Status: DC

## 2015-09-15 MED ORDER — METFORMIN HCL 1000 MG PO TABS: 1000.0000 mg | ORAL_TABLET | Freq: Two times a day (BID) | ORAL | Status: DC

## 2015-09-15 NOTE — Telephone Encounter (Signed)
Pt contacted office for refill request on the following medications: metFORMIN (GLUCOPHAGE) 1000 MG tablet, omega-3 acid ethyl esters (LOVAZA) 1 G capsule, &glipiZIDE (GLUCOTROL) 10 MG tablet  To [facility]. Pt stated the pharmacy advised him they haven't received the RX for the above 3 medications. It looks like they were printed on 07/20/15 instead of being e-scribed. Please resend RX to [facility]. Thanks TNP

## 2015-09-15 NOTE — Telephone Encounter (Signed)
Pt informed. Meds resent.

## 2015-09-20 ENCOUNTER — Other Ambulatory Visit: Payer: Self-pay

## 2015-09-20 DIAGNOSIS — E785 Hyperlipidemia, unspecified: Secondary | ICD-10-CM

## 2015-09-20 MED ORDER — PRAVASTATIN SODIUM 40 MG PO TABS: 40.0000 mg | ORAL_TABLET | Freq: Every day | ORAL | Status: DC

## 2015-10-03 LAB — HM COLONOSCOPY

## 2015-10-23 ENCOUNTER — Ambulatory Visit (INDEPENDENT_AMBULATORY_CARE_PROVIDER_SITE_OTHER): Payer: 59 | Admitting: Family Medicine

## 2015-10-23 VITALS — BP 132/66 | HR 78 | Temp 97.0°F | Resp 16 | Ht 68.0 in | Wt 217.0 lb

## 2015-10-23 DIAGNOSIS — E119 Type 2 diabetes mellitus without complications: Secondary | ICD-10-CM | POA: Diagnosis not present

## 2015-10-23 DIAGNOSIS — Z125 Encounter for screening for malignant neoplasm of prostate: Secondary | ICD-10-CM | POA: Diagnosis not present

## 2015-10-23 DIAGNOSIS — Z0000 Encounter for general adult medical examination without abnormal findings: Secondary | ICD-10-CM | POA: Diagnosis not present

## 2015-10-23 LAB — POCT UA - MICROALBUMIN: Microalbumin Ur, POC: 50 mg/L

## 2015-10-23 NOTE — Progress Notes (Signed)
Patient ID: Jeffrey [name], male   DOB: 1955-11-19, 60 y.o.   MRN: [id number] Patient: Jeffrey [name], Male    DOB: 1955-09-01, 60 y.o.   MRN: [id number] Visit Date: 10/23/2015  Today's Provider: [name], MD   Chief Complaint  Patient presents with  . Annual Exam   Subjective:  Jeffrey [name] is a 60 y.o. male who presents today for health maintenance and complete physical. He feels well. He reports exercising none. He reports he is sleeping fairly well.  Patient had his colonoscopy 10/03/15 patient states he has continued diverticulosis, internal hemorrhoids, lipoma (which was removed and reported as negative).  He did not note any polyps this time.  Repeat in 5 years.  Pneumovax 06/28/10 Tdap 10/30/07  Review of Systems  Constitutional: Negative.   HENT: Positive for drooling and tinnitus.   Eyes: Positive for itching.  Respiratory: Negative.        She has had a hacking cough for the past month. No other associated symptoms other than nasal congestion..  Cardiovascular: Negative.   Gastrointestinal: Negative.   Endocrine: Negative.   Genitourinary: Negative.   Musculoskeletal: Negative.   Skin: Negative.   Allergic/Immunologic: Positive for environmental allergies.  Neurological: Positive for numbness.  Hematological: Negative.   Psychiatric/Behavioral: Negative.     Social History   Social History  . Marital Status: Married    Spouse Name: N/A  . Number of Children: N/A  . Years of Education: N/A   Occupational History  . Not on file.   Social History Main Topics  . Smoking status: Former Smoker -- 1.50 packs/day    Types: Cigarettes    Quit date: 08/11/1984  . Smokeless tobacco: Never Used  . Alcohol Use: No  . Drug Use: No  . Sexual Activity: Yes   Other Topics Concern  . Not on file   Social History Narrative    Patient Active Problem List   Diagnosis Date Noted  . Sinusitis 09/08/2015  . Hypertriglyceridemia 07/19/2015  .  Pancreatitis 07/16/2015  . ED (erectile dysfunction) of organic origin 04/18/2015  . Hypertension 12/06/2014  . Seasonal allergies 12/06/2014  . Cholecystitis 12/06/2014  . Diverticulosis 12/06/2014  . GERD (gastroesophageal reflux disease) 12/06/2014  . Diabetes mellitus ([place]) 12/06/2014  . Plantar fasciitis 12/06/2014  . Actinic keratosis 12/06/2014  . Peyronie's disease 12/06/2014  . Erectile dysfunction 12/06/2014  . Obesity 12/06/2014  . Hyperlipidemia 12/06/2014  . Myalgia 12/06/2014    Past Surgical History  Procedure Laterality Date  . Meniscus repair    . Arthroscopic repair acl    . Hernia repair    . Lithotripsy      His family history includes Cancer in his maternal grandmother and paternal grandmother; Club foot in his son; Diabetes in his maternal grandfather; Drug abuse in his sister; Heart attack in his father and paternal grandfather; Heart disease in his brother and brother; Hypertension in his father and paternal grandfather.    Outpatient Prescriptions Prior to Visit  Medication Sig Dispense Refill  . amLODipine (NORVASC) 10 MG tablet Take 1 tablet by mouth  daily 90 tablet 3  . aspirin 81 MG tablet Take 81 mg by mouth daily.    . fenofibrate 160 MG tablet Take 1 tablet by mouth  daily 90 tablet 2  . fluticasone (FLONASE) 50 MCG/ACT nasal spray Place 1 spray into both nostrils daily. 16 g 12  . gabapentin (NEURONTIN) 100 MG capsule Take 200 mg by mouth every morning.    Marland Kitchen  glipiZIDE (GLUCOTROL) 10 MG tablet Take 0.5 tablets (5 mg total) by mouth daily before breakfast. 30 tablet 12  . glucose blood (ONETOUCH VERIO) test strip DX E11.9 50 each 12  . loratadine (CLARITIN) 10 MG tablet Take 10 mg by mouth daily.    Marland Kitchen losartan (COZAAR) 100 MG tablet Take 1 tablet by mouth  daily 90 tablet 3  . metFORMIN (GLUCOPHAGE) 1000 MG tablet Take 1 tablet (1,000 mg total) by mouth 2 (two) times daily with a meal. 60 tablet 12  . omega-3 acid ethyl esters (LOVAZA) 1 g  capsule Take 1 capsule (1 g total) by mouth 2 (two) times daily. 60 capsule 12  . omeprazole (PRILOSEC) 20 MG capsule Take 1 capsule by mouth  daily 90 capsule 3  . pravastatin (PRAVACHOL) 40 MG tablet Take 1 tablet (40 mg total) by mouth daily at 6 PM. 90 tablet 3  . sildenafil (VIAGRA) 100 MG tablet Take 100 mg by mouth daily as needed for erectile dysfunction.    . traMADol (ULTRAM) 50 MG tablet Take by mouth every 6 (six) hours as needed.     No facility-administered medications prior to visit.    Patient Care Team: Jerrol Banana., MD as PCP - General (Family Medicine)     Objective:   Vitals:  Filed Vitals:   10/23/15 1356  BP: 132/66  Pulse: 78  Temp: 97 F (36.1 C)  TempSrc: Oral  Resp: 16  Height: 5\' 8"  (1.727 m)  Weight: 217 lb (98.431 kg)    Physical Exam  Constitutional: He is oriented to person, place, and time. He appears well-developed and well-nourished.  HENT:  Head: Normocephalic and atraumatic.  Right Ear: External ear normal.  Left Ear: External ear normal.  Nose: Nose normal.  Mouth/Throat: Oropharynx is clear and moist.  Eyes: Conjunctivae and EOM are normal. Pupils are equal, round, and reactive to light.  Neck: Normal range of motion. Neck supple.  Cardiovascular: Normal rate, regular rhythm, normal heart sounds and intact distal pulses.   Pulmonary/Chest: Effort normal and breath sounds normal.  Abdominal: Soft. Bowel sounds are normal.  Musculoskeletal: Normal range of motion. He exhibits edema.  1+ lower extremity edema halfway to the knees.  Neurological: He is alert and oriented to person, place, and time.  Skin: Skin is warm and dry.  Psychiatric: He has a normal mood and affect. His behavior is normal. Judgment and thought content normal.     Depression Screen PHQ 2/9 Scores 04/18/2015  PHQ - 2 Score 0      Assessment & Plan:     Routine Health Maintenance and Physical Exam  Exercise Activities and Dietary  recommendations Goals    None      Immunization History  Administered Date(s) Administered  . Influenza,inj,Quad PF,36+ Mos 04/20/2015  . Pneumococcal Polysaccharide-23 06/28/2010  . Tdap 10/30/2007    Health Maintenance  Topic Date Due  . Hepatitis C Screening  1956/03/09  . FOOT EXAM  08/27/1964  . HIV Screening  08/27/1969  . ZOSTAVAX  08/27/2014  . PNEUMOCOCCAL POLYSACCHARIDE VACCINE (2) 06/29/2015  . HEMOGLOBIN A1C  01/13/2016  . INFLUENZA VACCINE  03/12/2016  . OPHTHALMOLOGY EXAM  04/24/2016  . TETANUS/TDAP  10/29/2017  . COLONOSCOPY  10/02/2020      Discussed health benefits of physical activity, and encouraged him to engage in regular exercise appropriate for his age and condition.   URI Clinically patient is doing well. We'll treat with Z-Pak to be worsens. Could  also be allergies. metabolic syndrome 2 diabetes, obesity, hypertension, dyslipidemia Diet and exercise with weight loss discussed at length. I have done the exam and reviewed the above chart and it is accurate to the best of my knowledge.  ------------------------------------------------------------------------------------------------------------

## 2015-10-25 LAB — COMPREHENSIVE METABOLIC PANEL
ALT: 27 IU/L (ref 0–44)
AST: 17 IU/L (ref 0–40)
Albumin/Globulin Ratio: 1.6 (ref 1.2–2.2)
Albumin: 4.4 g/dL (ref 3.6–4.8)
Alkaline Phosphatase: 43 IU/L (ref 39–117)
BUN/Creatinine Ratio: 17 (ref 10–22)
BUN: 20 mg/dL (ref 8–27)
Bilirubin Total: 0.3 mg/dL (ref 0.0–1.2)
CO2: 24 mmol/L (ref 18–29)
Calcium: 9.6 mg/dL (ref 8.6–10.2)
Chloride: 96 mmol/L (ref 96–106)
Creatinine, Ser: 1.2 mg/dL (ref 0.76–1.27)
GFR calc Af Amer: 75 mL/min/{1.73_m2} (ref >59)
GFR calc non Af Amer: 65 mL/min/{1.73_m2} (ref >59)
Globulin, Total: 2.7 g/dL (ref 1.5–4.5)
Glucose: 210 mg/dL — ABNORMAL HIGH (ref 65–99)
Potassium: 4.5 mmol/L (ref 3.5–5.2)
Sodium: 136 mmol/L (ref 134–144)
Total Protein: 7.1 g/dL (ref 6.0–8.5)

## 2015-10-25 LAB — LIPID PANEL WITH LDL/HDL RATIO
Cholesterol, Total: 137 mg/dL (ref 100–199)
HDL: 20 mg/dL — ABNORMAL LOW (ref >39)
Triglycerides: 515 mg/dL — ABNORMAL HIGH (ref 0–149)

## 2015-10-25 LAB — PSA: Prostate Specific Ag, Serum: 1.2 ng/mL (ref 0.0–4.0)

## 2015-10-25 LAB — CBC WITH DIFFERENTIAL/PLATELET
Basophils Absolute: 0 10*3/uL (ref 0.0–0.2)
Basos: 1 %
EOS (ABSOLUTE): 0.3 10*3/uL (ref 0.0–0.4)
Eos: 4 %
Hematocrit: 42.5 % (ref 37.5–51.0)
Hemoglobin: 14.3 g/dL (ref 12.6–17.7)
Immature Grans (Abs): 0 10*3/uL (ref 0.0–0.1)
Immature Granulocytes: 1 %
Lymphocytes Absolute: 3 10*3/uL (ref 0.7–3.1)
Lymphs: 47 %
MCH: 28 pg (ref 26.6–33.0)
MCHC: 33.6 g/dL (ref 31.5–35.7)
MCV: 83 fL (ref 79–97)
Monocytes Absolute: 0.6 10*3/uL (ref 0.1–0.9)
Monocytes: 9 %
Neutrophils Absolute: 2.4 10*3/uL (ref 1.4–7.0)
Neutrophils: 38 %
Platelets: 217 10*3/uL (ref 150–379)
RBC: 5.11 x10E6/uL (ref 4.14–5.80)
RDW: 15.2 % (ref 12.3–15.4)
WBC: 6.4 10*3/uL (ref 3.4–10.8)

## 2015-10-25 LAB — HEMOGLOBIN A1C
Est. average glucose Bld gHb Est-mCnc: 183 mg/dL
Hgb A1c MFr Bld: 8 % — ABNORMAL HIGH (ref 4.8–5.6)

## 2015-10-25 LAB — TSH: TSH: 3.08 u[IU]/mL (ref 0.450–4.500)

## 2015-10-26 ENCOUNTER — Ambulatory Visit: Payer: 59 | Admitting: Family Medicine

## 2015-10-26 ENCOUNTER — Telehealth: Payer: Self-pay | Admitting: Family Medicine

## 2015-10-26 DIAGNOSIS — J069 Acute upper respiratory infection, unspecified: Secondary | ICD-10-CM

## 2015-11-10 ENCOUNTER — Encounter: Payer: Self-pay | Admitting: Family Medicine

## 2015-11-16 MED ORDER — AZITHROMYCIN 250 MG PO TABS: ORAL_TABLET | ORAL | Status: DC

## 2015-11-16 NOTE — Telephone Encounter (Signed)
Advised patient as below. Medication sent into pharmacy.

## 2015-11-16 NOTE — Telephone Encounter (Signed)
Please review-aa 

## 2015-11-16 NOTE — Telephone Encounter (Signed)
Pt stated that when he was here on 10/23/15 for his CPE Dr. Rosanna Randy advised pt to call back if his congestion didn't get better. Pt stated it has gotten worse and has a cough. Pt would like something sent to [facility]. Please advise. Thanks TNP

## 2015-11-16 NOTE — Telephone Encounter (Signed)
Z-Pak. Generic Robitussin as an expectorant

## 2015-12-07 ENCOUNTER — Other Ambulatory Visit: Payer: Self-pay | Admitting: Family Medicine

## 2015-12-29 ENCOUNTER — Other Ambulatory Visit: Payer: Self-pay | Admitting: Family Medicine

## 2016-01-06 ENCOUNTER — Other Ambulatory Visit: Payer: Self-pay | Admitting: Family Medicine

## 2016-01-26 ENCOUNTER — Other Ambulatory Visit: Payer: Self-pay | Admitting: Family Medicine

## 2016-01-29 ENCOUNTER — Other Ambulatory Visit: Payer: Self-pay

## 2016-01-29 MED ORDER — NAPROXEN 500 MG PO TABS: 500.0000 mg | ORAL_TABLET | Freq: Two times a day (BID) | ORAL | Status: DC | PRN

## 2016-01-29 NOTE — Telephone Encounter (Signed)
Refill request fax from [facility] for naproxen-aa

## 2016-02-20 ENCOUNTER — Ambulatory Visit: Payer: 59 | Admitting: Family Medicine

## 2016-02-29 ENCOUNTER — Ambulatory Visit: Payer: Self-pay | Admitting: Family Medicine

## 2016-03-12 ENCOUNTER — Encounter: Payer: Self-pay | Admitting: Family Medicine

## 2016-03-12 ENCOUNTER — Ambulatory Visit (INDEPENDENT_AMBULATORY_CARE_PROVIDER_SITE_OTHER): Payer: 59 | Admitting: Family Medicine

## 2016-03-12 VITALS — BP 144/68 | HR 78 | Temp 97.4°F | Resp 16 | Wt 219.0 lb

## 2016-03-12 DIAGNOSIS — E785 Hyperlipidemia, unspecified: Secondary | ICD-10-CM

## 2016-03-12 DIAGNOSIS — Z23 Encounter for immunization: Secondary | ICD-10-CM

## 2016-03-12 DIAGNOSIS — I10 Essential (primary) hypertension: Secondary | ICD-10-CM | POA: Diagnosis not present

## 2016-03-12 DIAGNOSIS — E119 Type 2 diabetes mellitus without complications: Secondary | ICD-10-CM

## 2016-03-12 LAB — POCT GLYCOSYLATED HEMOGLOBIN (HGB A1C): Hemoglobin A1C: 8.5

## 2016-03-12 NOTE — Progress Notes (Signed)
Subjective:  HPI  Diabetes Mellitus Type II, Follow-up:   Lab Results  Component Value Date   HGBA1C 8.0 (H) 10/24/2015   HGBA1C 9.5 (H) 07/15/2015   HGBA1C 7.1 04/18/2015    Last seen for diabetes 4 months ago.  Management since then includes none. He reports good compliance with treatment. He is not having side effects.  Current symptoms include none and have been unchanged. Home blood sugar records: 180's-300's  Episodes of hypoglycemia? no   Current Insulin Regimen: n/a Most Recent Eye Exam: 12/2014 Current exercise: walking  Pertinent Labs:    Component Value Date/Time   CHOL 137 10/24/2015 0815   TRIG 515 (H) 10/24/2015 0815   HDL 20 (L) 10/24/2015 0815   LDLCALC Comment 10/24/2015 0815   CREATININE 1.20 10/24/2015 0815    Wt Readings from Last 3 Encounters:  03/12/16 219 lb (99.3 kg)  10/23/15 217 lb (98.4 kg)  09/08/15 214 lb (97.1 kg)    ------------------------------------------------------------------------    Prior to Admission medications   Medication Sig Start Date End Date Taking? Authorizing Provider  albuterol (PROVENTIL HFA;VENTOLIN HFA) 108 (90 Base) MCG/ACT inhaler Inhale 2 puffs into the lungs every 4 (four) hours as needed for wheezing or shortness of breath.   Yes Historical Provider, MD  amLODipine (NORVASC) 10 MG tablet Take 1 tablet by mouth  daily 01/26/16  Yes Jerrol Banana., MD  aspirin 81 MG tablet Take 81 mg by mouth daily.   Yes Historical Provider, MD  fenofibrate 160 MG tablet Take 1 tablet by mouth  daily 12/29/15  Yes Richard Maceo Pro., MD  fluticasone [facility]) 50 MCG/ACT nasal spray Place 1 spray into both nostrils daily. 05/04/15  Yes Richard Maceo Pro., MD  gabapentin (NEURONTIN) 100 MG capsule Take 200 mg by mouth every morning.   Yes Historical Provider, MD  glipiZIDE (GLUCOTROL) 10 MG tablet Take 0.5 tablets (5 mg total) by mouth daily before breakfast. 09/15/15  Yes Jerrol Banana., MD  glucose blood  [facility] VERIO) test strip DX E11.9 08/15/15  Yes Richard L Cranford Mon., MD  loratadine (CLARITIN) 10 MG tablet TAKE 1 TABLET BY MOUTH EVERY DAY 12/07/15  Yes Richard Maceo Pro., MD  losartan (COZAAR) 100 MG tablet Take 1 tablet by mouth  daily 01/26/16  Yes Richard Maceo Pro., MD  metFORMIN (GLUCOPHAGE) 1000 MG tablet Take 1 tablet (1,000 mg total) by mouth 2 (two) times daily with a meal. 09/15/15  Yes Jerrol Banana., MD  naproxen (NAPROSYN) 500 MG tablet Take 1 tablet (500 mg total) by mouth 2 (two) times daily as needed. 01/29/16  Yes Richard Maceo Pro., MD  omega-3 acid ethyl esters (LOVAZA) 1 g capsule Take 1 capsule (1 g total) by mouth 2 (two) times daily. 09/15/15  Yes Richard Maceo Pro., MD  omeprazole (PRILOSEC) 20 MG capsule Take 1 capsule by mouth  daily 08/15/15  Yes Richard Maceo Pro., MD  pravastatin (PRAVACHOL) 40 MG tablet Take 1 tablet (40 mg total) by mouth daily at 6 PM. 09/20/15  Yes Jerrol Banana., MD  sildenafil (VIAGRA) 100 MG tablet Take 100 mg by mouth daily as needed for erectile dysfunction.   Yes Historical Provider, MD  traMADol (ULTRAM) 50 MG tablet Take by mouth every 6 (six) hours as needed.   Yes Historical Provider, MD    Patient Active Problem List   Diagnosis Date Noted  . Sinusitis 09/08/2015  .  Hypertriglyceridemia 07/19/2015  . Pancreatitis 07/16/2015  . ED (erectile dysfunction) of organic origin 04/18/2015  . Hypertension 12/06/2014  . Seasonal allergies 12/06/2014  . Cholecystitis 12/06/2014  . Diverticulosis 12/06/2014  . GERD (gastroesophageal reflux disease) 12/06/2014  . Diabetes mellitus ([place]) 12/06/2014  . Plantar fasciitis 12/06/2014  . Actinic keratosis 12/06/2014  . Peyronie's disease 12/06/2014  . Erectile dysfunction 12/06/2014  . Obesity 12/06/2014  . Hyperlipidemia 12/06/2014  . Myalgia 12/06/2014    Past Medical History:  Diagnosis Date  . Actinic keratosis   . Cholecystitis   . Diabetes mellitus ([place])     . Diverticulosis   . Erectile dysfunction   . GERD (gastroesophageal reflux disease)   . Hyperlipidemia   . Hypertension   . Kidney stones   . Myalgia   . Obesity   . Peyronie's disease   . Plantar fasciitis   . Seasonal allergies     Social History   Social History  . Marital status: Married    Spouse name: N/A  . Number of children: N/A  . Years of education: N/A   Occupational History  . Not on file.   Social History Main Topics  . Smoking status: Former Smoker    Packs/day: 1.50    Types: Cigarettes    Quit date: 08/11/1984  . Smokeless tobacco: Never Used  . Alcohol use No  . Drug use: No  . Sexual activity: Yes   Other Topics Concern  . Not on file   Social History Narrative  . No narrative on file    Allergies  Allergen Reactions  . Codeine     Review of Systems  Constitutional: Negative.   Eyes: Negative.   Respiratory: Negative.   Cardiovascular: Negative.   Gastrointestinal: Negative.   Genitourinary: Negative.   Musculoskeletal: Negative.   Skin: Negative.   Neurological: Positive for headaches.  Endo/Heme/Allergies: Negative.   Psychiatric/Behavioral: Negative.     Immunization History  Administered Date(s) Administered  . Influenza,inj,Quad PF,36+ Mos 04/20/2015  . Pneumococcal Polysaccharide-23 06/28/2010  . Tdap 10/30/2007   Objective:  BP (!) 144/68 (BP Location: Left Arm, Patient Position: Sitting, Cuff Size: Normal)   Pulse 78   Temp 97.4 F (36.3 C) (Oral)   Resp 16   Wt 219 lb (99.3 kg)   BMI 33.30 kg/m   Physical Exam  Constitutional: He is oriented to person, place, and time and well-developed, well-nourished, and in no distress.  HENT:  Head: Normocephalic and atraumatic.  Right Ear: External ear normal.  Left Ear: External ear normal.  Nose: Nose normal.  Eyes: Conjunctivae and EOM are normal. Pupils are equal, round, and reactive to light.  Neck: Normal range of motion. Neck supple.  Cardiovascular: Normal  rate, regular rhythm, normal heart sounds and intact distal pulses.   Pulmonary/Chest: Effort normal and breath sounds normal.  Abdominal: Soft.  Musculoskeletal: Normal range of motion.  Neurological: He is alert and oriented to person, place, and time. He has normal reflexes. Gait normal. GCS score is 15.  Skin: Skin is warm and dry.  Psychiatric: Mood, memory, affect and judgment normal.    Lab Results  Component Value Date   WBC 6.4 10/24/2015   HGB 14.1 07/19/2015   HCT 42.5 10/24/2015   PLT 217 10/24/2015   GLUCOSE 210 (H) 10/24/2015   CHOL 137 10/24/2015   TRIG 515 (H) 10/24/2015   HDL 20 (L) 10/24/2015   LDLCALC Comment 10/24/2015   TSH 3.080 10/24/2015   PSA 1.1 08/22/2014  HGBA1C 8.0 (H) 10/24/2015    CMP     Component Value Date/Time   NA 136 10/24/2015 0815   K 4.5 10/24/2015 0815   CL 96 10/24/2015 0815   CO2 24 10/24/2015 0815   GLUCOSE 210 (H) 10/24/2015 0815   GLUCOSE 159 (H) 07/19/2015 0504   BUN 20 10/24/2015 0815   CREATININE 1.20 10/24/2015 0815   CALCIUM 9.6 10/24/2015 0815   PROT 7.1 10/24/2015 0815   ALBUMIN 4.4 10/24/2015 0815   AST 17 10/24/2015 0815   ALT 27 10/24/2015 0815   ALKPHOS 43 10/24/2015 0815   BILITOT 0.3 10/24/2015 0815   GFRNONAA 65 10/24/2015 0815   GFRAA 75 10/24/2015 0815    Assessment and Plan :  1. Type 2 diabetes mellitus without complication, unspecified long term insulin use status (HCC)  - POCT HgB A1C- 8.5 today. Work hard on diet and exercise. If not improved in 3 months may ad on another medication.    2. Need for zoster vaccination  - Varicella-zoster vaccine subcutaneous 3. Obesity Exercise discussed. Dietary changes discussed at length. Patient was seen and examined by Dr. Miguel Aschoff, and noted scribed by Webb Laws, [place] MD [facility] 03/12/2016 4:06 PM

## 2016-03-29 ENCOUNTER — Other Ambulatory Visit: Payer: Self-pay

## 2016-04-01 MED ORDER — GLUCOSE BLOOD VI STRP: ORAL_STRIP | 4 refills | Status: DC

## 2016-04-25 ENCOUNTER — Other Ambulatory Visit: Payer: Self-pay

## 2016-04-25 MED ORDER — ONETOUCH DELICA LANCETS 33G MISC
3 refills | Status: DC
Start: 2016-04-25 — End: 2017-03-21

## 2016-04-25 MED ORDER — LORATADINE 10 MG PO TABS: 10.0000 mg | ORAL_TABLET | Freq: Every day | ORAL | 3 refills | Status: DC

## 2016-06-10 ENCOUNTER — Ambulatory Visit (INDEPENDENT_AMBULATORY_CARE_PROVIDER_SITE_OTHER): Payer: 59 | Admitting: Family Medicine

## 2016-06-10 VITALS — BP 110/62 | HR 76 | Temp 98.3°F | Resp 14 | Wt 216.0 lb

## 2016-06-10 DIAGNOSIS — E7849 Other hyperlipidemia: Secondary | ICD-10-CM

## 2016-06-10 DIAGNOSIS — E119 Type 2 diabetes mellitus without complications: Secondary | ICD-10-CM | POA: Diagnosis not present

## 2016-06-10 DIAGNOSIS — I10 Essential (primary) hypertension: Secondary | ICD-10-CM | POA: Diagnosis not present

## 2016-06-10 DIAGNOSIS — E784 Other hyperlipidemia: Secondary | ICD-10-CM

## 2016-06-10 DIAGNOSIS — E669 Obesity, unspecified: Secondary | ICD-10-CM | POA: Diagnosis not present

## 2016-06-10 DIAGNOSIS — Z6832 Body mass index (BMI) 32.0-32.9, adult: Secondary | ICD-10-CM

## 2016-06-10 NOTE — Progress Notes (Signed)
Jeffrey [name]  MRN: [id number] DOB: February 18, 1956  Subjective:  HPI  Patient is here for 3 months follow up but he is about 2 days early for his A1C check.He is not working on his diet at all. He recognizes that his sugar is better after exercising. He does not exercise regularly. Lab Results  Component Value Date   HGBA1C 8.5 03/12/2016   He is checking his sugars and fastings have been around 240-300. Last labs were done in march 2017-routine. Wt Readings from Last 3 Encounters:  06/10/16 216 lb (98 kg)  03/12/16 219 lb (99.3 kg)  10/23/15 217 lb (98.4 kg)    Patient Active Problem List   Diagnosis Date Noted  . Sinusitis 09/08/2015  . Hypertriglyceridemia 07/19/2015  . Pancreatitis 07/16/2015  . ED (erectile dysfunction) of organic origin 04/18/2015  . Hypertension 12/06/2014  . Seasonal allergies 12/06/2014  . Cholecystitis 12/06/2014  . Diverticulosis 12/06/2014  . GERD (gastroesophageal reflux disease) 12/06/2014  . Diabetes mellitus ([place]) 12/06/2014  . Plantar fasciitis 12/06/2014  . Actinic keratosis 12/06/2014  . Peyronie's disease 12/06/2014  . Erectile dysfunction 12/06/2014  . Obesity 12/06/2014  . Hyperlipidemia 12/06/2014  . Myalgia 12/06/2014    Past Medical History:  Diagnosis Date  . Actinic keratosis   . Cholecystitis   . Diabetes mellitus ([place])   . Diverticulosis   . Erectile dysfunction   . GERD (gastroesophageal reflux disease)   . Hyperlipidemia   . Hypertension   . Kidney stones   . Myalgia   . Obesity   . Peyronie's disease   . Plantar fasciitis   . Seasonal allergies     Social History   Social History  . Marital status: Married    Spouse name: N/A  . Number of children: N/A  . Years of education: N/A   Occupational History  . Not on file.   Social History Main Topics  . Smoking status: Former Smoker    Packs/day: 1.50    Types: Cigarettes    Quit date: 08/11/1984  . Smokeless tobacco: Never Used  . Alcohol use  No  . Drug use: No  . Sexual activity: Yes   Other Topics Concern  . Not on file   Social History Narrative  . No narrative on file    Outpatient Encounter Prescriptions as of 06/10/2016  Medication Sig  . albuterol (PROVENTIL HFA;VENTOLIN HFA) 108 (90 Base) MCG/ACT inhaler Inhale 2 puffs into the lungs every 4 (four) hours as needed for wheezing or shortness of breath.  Marland Kitchen amLODipine (NORVASC) 10 MG tablet Take 1 tablet by mouth  daily  . aspirin 81 MG tablet Take 81 mg by mouth daily.  . fenofibrate 160 MG tablet Take 1 tablet by mouth  daily  . fluticasone (FLONASE) 50 MCG/ACT nasal spray Place 1 spray into both nostrils daily.  Marland Kitchen gabapentin (NEURONTIN) 100 MG capsule Take 200 mg by mouth every morning.  Marland Kitchen glipiZIDE (GLUCOTROL) 10 MG tablet Take 0.5 tablets (5 mg total) by mouth daily before breakfast.  . glucose blood (ONETOUCH VERIO) test strip To check blood sugar twice a day. DX E11.9  . loratadine (CLARITIN) 10 MG tablet Take 1 tablet (10 mg total) by mouth daily.  Marland Kitchen losartan (COZAAR) 100 MG tablet Take 1 tablet by mouth  daily  . metFORMIN (GLUCOPHAGE) 1000 MG tablet Take 1 tablet (1,000 mg total) by mouth 2 (two) times daily with a meal.  . naproxen (NAPROSYN) 500 MG tablet Take 1 tablet (500  mg total) by mouth 2 (two) times daily as needed.  Marland Kitchen omega-3 acid ethyl esters (LOVAZA) 1 g capsule Take 1 capsule (1 g total) by mouth 2 (two) times daily.  Marland Kitchen omeprazole (PRILOSEC) 20 MG capsule Take 1 capsule by mouth  daily  . ONETOUCH DELICA LANCETS 99991111 MISC Check sugar once daily DX E11.9  . pravastatin (PRAVACHOL) 40 MG tablet Take 1 tablet (40 mg total) by mouth daily at 6 PM.  . sildenafil (VIAGRA) 100 MG tablet Take 100 mg by mouth daily as needed for erectile dysfunction.  . traMADol (ULTRAM) 50 MG tablet Take by mouth every 6 (six) hours as needed.   No facility-administered encounter medications on file as of 06/10/2016.     Allergies  Allergen Reactions  . Codeine      Review of Systems  Constitutional: Negative.   Eyes: Negative.   Respiratory: Negative.   Cardiovascular: Negative.   Gastrointestinal: Negative.   Musculoskeletal: Positive for joint pain (knee).  Skin: Negative.   Neurological: Positive for tingling.  Psychiatric/Behavioral: Negative.    Objective:  BP 110/62   Pulse 76   Temp 98.3 F (36.8 C)   Resp 14   Wt 216 lb (98 kg)   BMI 32.84 kg/m   Physical Exam  Constitutional: He is oriented to person, place, and time and well-developed, well-nourished, and in no distress.  HENT:  Head: Normocephalic and atraumatic.  Right Ear: External ear normal.  Nose: Nose normal.  Eyes: Conjunctivae are normal. Pupils are equal, round, and reactive to light.  Neck: Normal range of motion. Neck supple.  Cardiovascular: Normal rate, regular rhythm, normal heart sounds and intact distal pulses.   No murmur heard. Pulmonary/Chest: Effort normal and breath sounds normal. No respiratory distress. He has no wheezes.  Neurological: He is alert and oriented to person, place, and time. He exhibits normal muscle tone. Gait normal. Coordination normal. GCS score is 15.  Skin: Skin is warm and dry.  Psychiatric: Mood, memory, affect and judgment normal.    Assessment and Plan :  1. Type 2 diabetes mellitus without complication, unspecified long term insulin use status ([place]) Gave patient a lab slip to get his A1C done. If levels are not better will add Jardiance at that time.  2. Essential hypertension Stable.  3. Other hyperlipidemia   4. Class 1 obesity without serious comorbidity with body mass index (BMI) of 32.0 to 32.9 in adult, unspecified obesity type Work on habits.  HPI, Exam and A&P transcribed under direction and in the presence of Miguel Aschoff, MD. I have done the exam and reviewed the chart and it is accurate to the best of my knowledge. Miguel Aschoff M.D. [facility]

## 2016-06-13 ENCOUNTER — Ambulatory Visit: Payer: 59 | Admitting: Family Medicine

## 2016-06-15 LAB — HEMOGLOBIN A1C
Est. average glucose Bld gHb Est-mCnc: 226 mg/dL
Hgb A1c MFr Bld: 9.5 % — ABNORMAL HIGH (ref 4.8–5.6)

## 2016-06-17 ENCOUNTER — Telehealth: Payer: Self-pay

## 2016-06-17 ENCOUNTER — Telehealth: Payer: Self-pay | Admitting: Family Medicine

## 2016-06-17 MED ORDER — EMPAGLIFLOZIN 10 MG PO TABS: 10.0000 mg | ORAL_TABLET | Freq: Every day | ORAL | 3 refills | Status: DC

## 2016-06-17 NOTE — Telephone Encounter (Signed)
Pt has question regarding Jardiance and his other medications.  He said Dr. Rosanna Randy had told him not to take the [place] with another RX that he has.  He cant remember which one.  Please advise. Thank sTeri

## 2016-06-17 NOTE — Telephone Encounter (Signed)
-----   Message from Jerrol Banana., MD sent at 06/17/2016  2:19 PM EST ----- A1c was 8.5, now 9.5. Add Jardiance 10 mg daily as discussed. Return to clinic 3 months.

## 2016-06-18 NOTE — Telephone Encounter (Signed)
Do you re call telling him to stop a medication while taking jardiance? I did not see anything in the note-aa

## 2016-06-18 NOTE — Telephone Encounter (Signed)
No,in addition to--may stop Glipizide in the future.

## 2016-06-18 NOTE — Telephone Encounter (Signed)
Pt informed and voiced understanding of results. 

## 2016-07-31 ENCOUNTER — Other Ambulatory Visit: Payer: Self-pay | Admitting: Family Medicine

## 2016-07-31 DIAGNOSIS — E785 Hyperlipidemia, unspecified: Secondary | ICD-10-CM

## 2016-08-09 ENCOUNTER — Other Ambulatory Visit: Payer: Self-pay

## 2016-08-09 DIAGNOSIS — E7849 Other hyperlipidemia: Secondary | ICD-10-CM

## 2016-08-09 MED ORDER — OMEGA-3-ACID ETHYL ESTERS 1 G PO CAPS: 1.0000 g | ORAL_CAPSULE | Freq: Two times a day (BID) | ORAL | 3 refills | Status: DC

## 2016-08-13 ENCOUNTER — Ambulatory Visit (INDEPENDENT_AMBULATORY_CARE_PROVIDER_SITE_OTHER): Payer: 59 | Admitting: Family Medicine

## 2016-08-13 ENCOUNTER — Encounter: Payer: Self-pay | Admitting: Family Medicine

## 2016-08-13 VITALS — BP 154/88 | HR 98 | Temp 100.3°F | Resp 18 | Wt 215.0 lb

## 2016-08-13 DIAGNOSIS — E119 Type 2 diabetes mellitus without complications: Secondary | ICD-10-CM | POA: Diagnosis not present

## 2016-08-13 DIAGNOSIS — J101 Influenza due to other identified influenza virus with other respiratory manifestations: Secondary | ICD-10-CM

## 2016-08-13 LAB — POCT INFLUENZA A/B
Influenza A, POC: POSITIVE — AB
Influenza B, POC: NEGATIVE

## 2016-08-13 MED ORDER — OSELTAMIVIR PHOSPHATE 75 MG PO CAPS: 75.0000 mg | ORAL_CAPSULE | Freq: Two times a day (BID) | ORAL | 0 refills | Status: DC

## 2016-08-13 NOTE — Progress Notes (Signed)
Jeffrey [name]  MRN: [id number] DOB: 02-27-1956  Subjective:  HPI  Patient states he has been taking care of his mother who had flu and pneumonia a week ago, then his wife was treated for flu Friday 08/09/16. He started feeling bad yesterday-08/12/16, symptoms present are sore throat-feels on fire, "chest feels on fire", congestion, runny nose, drainage, shortness of breath.  Patient Active Problem List   Diagnosis Date Noted  . Sinusitis 09/08/2015  . Hypertriglyceridemia 07/19/2015  . Pancreatitis 07/16/2015  . ED (erectile dysfunction) of organic origin 04/18/2015  . Hypertension 12/06/2014  . Seasonal allergies 12/06/2014  . Cholecystitis 12/06/2014  . Diverticulosis 12/06/2014  . GERD (gastroesophageal reflux disease) 12/06/2014  . Diabetes mellitus ([place]) 12/06/2014  . Plantar fasciitis 12/06/2014  . Actinic keratosis 12/06/2014  . Peyronie's disease 12/06/2014  . Erectile dysfunction 12/06/2014  . Obesity 12/06/2014  . Hyperlipidemia 12/06/2014  . Myalgia 12/06/2014    Past Medical History:  Diagnosis Date  . Actinic keratosis   . Cholecystitis   . Diabetes mellitus ([place])   . Diverticulosis   . Erectile dysfunction   . GERD (gastroesophageal reflux disease)   . Hyperlipidemia   . Hypertension   . Kidney stones   . Myalgia   . Obesity   . Peyronie's disease   . Plantar fasciitis   . Seasonal allergies     Social History   Social History  . Marital status: Married    Spouse name: N/A  . Number of children: N/A  . Years of education: N/A   Occupational History  . Not on file.   Social History Main Topics  . Smoking status: Former Smoker    Packs/day: 1.50    Types: Cigarettes    Quit date: 08/11/1984  . Smokeless tobacco: Never Used  . Alcohol use No  . Drug use: No  . Sexual activity: Yes   Other Topics Concern  . Not on file   Social History Narrative  . No narrative on file    Outpatient Encounter Prescriptions as of 08/13/2016    Medication Sig  . albuterol (PROVENTIL HFA;VENTOLIN HFA) 108 (90 Base) MCG/ACT inhaler Inhale 2 puffs into the lungs every 4 (four) hours as needed for wheezing or shortness of breath.  Marland Kitchen amLODipine (NORVASC) 10 MG tablet Take 1 tablet by mouth  daily  . aspirin 81 MG tablet Take 81 mg by mouth daily.  . empagliflozin (JARDIANCE) 10 MG TABS tablet Take 10 mg by mouth daily.  . fenofibrate 160 MG tablet Take 1 tablet by mouth  daily  . fluticasone (FLONASE) 50 MCG/ACT nasal spray Place 1 spray into both nostrils daily.  Marland Kitchen gabapentin (NEURONTIN) 100 MG capsule Take 200 mg by mouth every morning.  Marland Kitchen glipiZIDE (GLUCOTROL) 10 MG tablet Take 0.5 tablets (5 mg total) by mouth daily before breakfast.  . glucose blood (ONETOUCH VERIO) test strip To check blood sugar twice a day. DX E11.9  . loratadine (CLARITIN) 10 MG tablet Take 1 tablet (10 mg total) by mouth daily.  Marland Kitchen losartan (COZAAR) 100 MG tablet Take 1 tablet by mouth  daily  . metFORMIN (GLUCOPHAGE) 1000 MG tablet Take 1 tablet (1,000 mg total) by mouth 2 (two) times daily with a meal.  . naproxen (NAPROSYN) 500 MG tablet Take 1 tablet (500 mg total) by mouth 2 (two) times daily as needed.  Marland Kitchen omega-3 acid ethyl esters (LOVAZA) 1 g capsule Take 1 capsule (1 g total) by mouth 2 (two) times daily.  Marland Kitchen  omeprazole (PRILOSEC) 20 MG capsule Take 1 capsule by mouth  daily  . ONETOUCH DELICA LANCETS 99991111 MISC Check sugar once daily DX E11.9  . pravastatin (PRAVACHOL) 40 MG tablet TAKE 1 TABLET BY MOUTH  DAILY AT 6 PM.  . sildenafil (VIAGRA) 100 MG tablet Take 100 mg by mouth daily as needed for erectile dysfunction.  . traMADol (ULTRAM) 50 MG tablet Take by mouth every 6 (six) hours as needed.   No facility-administered encounter medications on file as of 08/13/2016.     Allergies  Allergen Reactions  . Codeine     Review of Systems  Constitutional: Positive for chills and malaise/fatigue.  HENT: Positive for congestion and sore throat.    Respiratory: Positive for cough and shortness of breath.   Cardiovascular: Negative.   Neurological: Positive for weakness.    Objective:  BP (!) 154/88   Pulse 98   Temp 100.3 F (37.9 C)   Resp 18   Wt 215 lb (97.5 kg)   SpO2 97%   BMI 32.69 kg/m   Physical Exam  Constitutional: He is oriented to person, place, and time and well-developed, well-nourished, and in no distress.  Well-nourished well-developed male in no acute distress but obviously does not feel well today.  HENT:  Head: Normocephalic and atraumatic.  Right Ear: External ear normal.  Left Ear: External ear normal.  Nose: Nose normal.  Mouth/Throat: Oropharynx is clear and moist.  Eyes: Conjunctivae are normal. No scleral icterus.  Cardiovascular: Normal rate, regular rhythm and normal heart sounds.   Pulmonary/Chest: Effort normal and breath sounds normal.  Abdominal: Soft.  Lymphadenopathy:    He has no cervical adenopathy.  Neurological: He is alert and oriented to person, place, and time.  Skin: Skin is warm and dry.  Psychiatric: Mood, memory, affect and judgment normal.    Assessment and Plan :  Influenza A Treat with Tamiflu for 5 days. The rest of the family has artery had the flu. Push fluids and encourage rest. TIIDM  I have done the exam and reviewed the chart and it is accurate to the best of my knowledge. Development worker, community has been used and  any errors in dictation or transcription are unintentional. Miguel Aschoff M.D. [facility]

## 2016-08-15 ENCOUNTER — Other Ambulatory Visit: Payer: Self-pay | Admitting: Family Medicine

## 2016-08-19 ENCOUNTER — Emergency Department: Payer: Commercial Managed Care - HMO

## 2016-08-19 ENCOUNTER — Encounter: Payer: Self-pay | Admitting: Emergency Medicine

## 2016-08-19 ENCOUNTER — Emergency Department
Admission: EM | Admit: 2016-08-19 | Discharge: 2016-08-19 | Disposition: A | Discharge status: Home / Self Care | Payer: Commercial Managed Care - HMO | Attending: Emergency Medicine | Admitting: Emergency Medicine

## 2016-08-19 DIAGNOSIS — Z79899 Other long term (current) drug therapy: Secondary | ICD-10-CM | POA: Insufficient documentation

## 2016-08-19 DIAGNOSIS — R1031 Right lower quadrant pain: Secondary | ICD-10-CM | POA: Insufficient documentation

## 2016-08-19 DIAGNOSIS — R109 Unspecified abdominal pain: Secondary | ICD-10-CM

## 2016-08-19 DIAGNOSIS — I10 Essential (primary) hypertension: Secondary | ICD-10-CM | POA: Diagnosis not present

## 2016-08-19 DIAGNOSIS — Z87891 Personal history of nicotine dependence: Secondary | ICD-10-CM | POA: Insufficient documentation

## 2016-08-19 DIAGNOSIS — Z7984 Long term (current) use of oral hypoglycemic drugs: Secondary | ICD-10-CM | POA: Insufficient documentation

## 2016-08-19 DIAGNOSIS — Z7982 Long term (current) use of aspirin: Secondary | ICD-10-CM | POA: Diagnosis not present

## 2016-08-19 DIAGNOSIS — E119 Type 2 diabetes mellitus without complications: Secondary | ICD-10-CM | POA: Insufficient documentation

## 2016-08-19 LAB — URINALYSIS, COMPLETE (UACMP) WITH MICROSCOPIC
Bacteria, UA: NONE SEEN
Bilirubin Urine: NEGATIVE
Glucose, UA: >=500 mg/dL — AB
Hgb urine dipstick: NEGATIVE
Ketones, ur: NEGATIVE mg/dL
Leukocytes, UA: NEGATIVE
Nitrite: NEGATIVE
Protein, ur: NEGATIVE mg/dL
Specific Gravity, Urine: 1.027 (ref 1.005–1.030)
Squamous Epithelial / LPF: NONE SEEN
pH: 5 (ref 5.0–8.0)

## 2016-08-19 LAB — CBC
HCT: 45.4 % (ref 40.0–52.0)
Hemoglobin: 15.5 g/dL (ref 13.0–18.0)
MCH: 28.5 pg (ref 26.0–34.0)
MCHC: 34.1 g/dL (ref 32.0–36.0)
MCV: 83.6 fL (ref 80.0–100.0)
Platelets: 260 10*3/uL (ref 150–440)
RBC: 5.43 MIL/uL (ref 4.40–5.90)
RDW: 14.7 % — ABNORMAL HIGH (ref 11.5–14.5)
WBC: 8.2 10*3/uL (ref 3.8–10.6)

## 2016-08-19 LAB — BASIC METABOLIC PANEL
Anion gap: 9 (ref 5–15)
BUN: 31 mg/dL — ABNORMAL HIGH (ref 6–20)
CO2: 26 mmol/L (ref 22–32)
Calcium: 9.2 mg/dL (ref 8.9–10.3)
Chloride: 100 mmol/L — ABNORMAL LOW (ref 101–111)
Creatinine, Ser: 1.44 mg/dL — ABNORMAL HIGH (ref 0.61–1.24)
GFR calc Af Amer: 59 mL/min — ABNORMAL LOW (ref >60)
GFR calc non Af Amer: 51 mL/min — ABNORMAL LOW (ref >60)
Glucose, Bld: 191 mg/dL — ABNORMAL HIGH (ref 65–99)
Potassium: 4.2 mmol/L (ref 3.5–5.1)
Sodium: 135 mmol/L (ref 135–145)

## 2016-08-19 IMAGING — CT CT CTA ABD/PEL W/CM AND/OR W/O CM
3 of 9 series · 11 of 46 positions shown, 17 images · IV contrast (isovue)
Comparison: [DATE], [DATE]

CLINICAL DATA: 60-year-old male with right lower back pain 7 hours
ago. Previous kidney stones

EXAM:
CT ANGIOGRAPHY ABDOMEN AND PELVIS WITH CONTRAST AND WITHOUT CONTRAST
TECHNIQUE: Multidetector CT imaging of the abdomen and pelvis was performed
using the standard protocol during bolus administration of
intravenous contrast. Multiplanar reconstructed images and MIPs were
obtained and reviewed to evaluate the vascular anatomy.
CONTRAST:  100 cc Isovue 370

[Series 4: axial arterial · axial · arterial · 0.82mm/px · z∈[-925,-821]mm · 3 of 259 slices shown]
[im 26/259  soft-tissue]
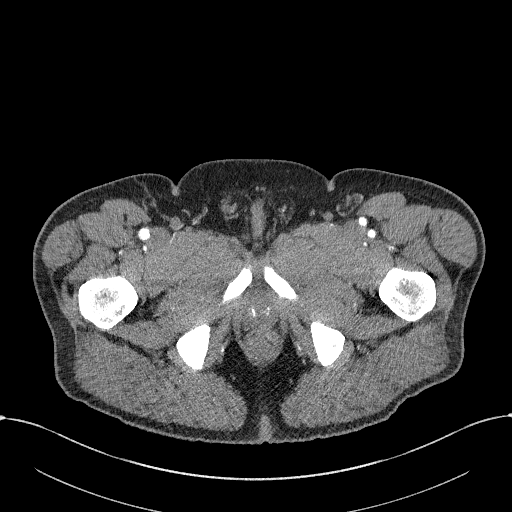
[im 52/259  soft-tissue]
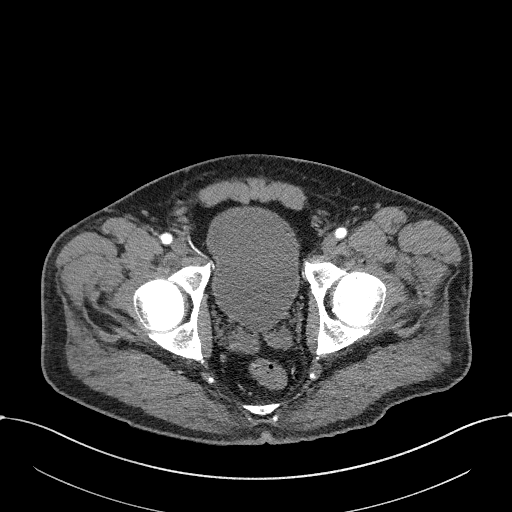
[im 78/259  soft-tissue]
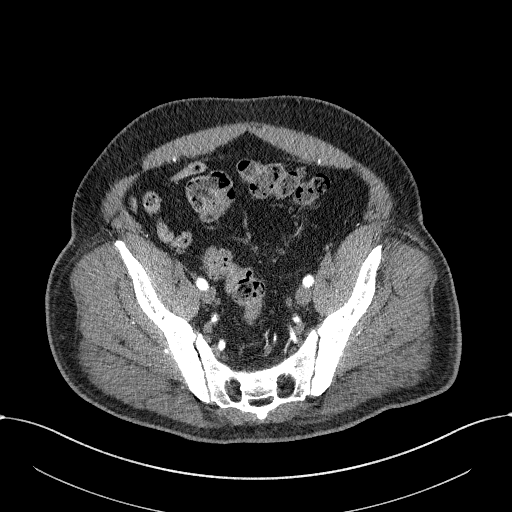

[Series 5: axial venous · axial · portal-venous · 0.82mm/px · z∈[-903,-533]mm · 6 of 104 slices shown, 11 images]
[im 15/104  soft-tissue]
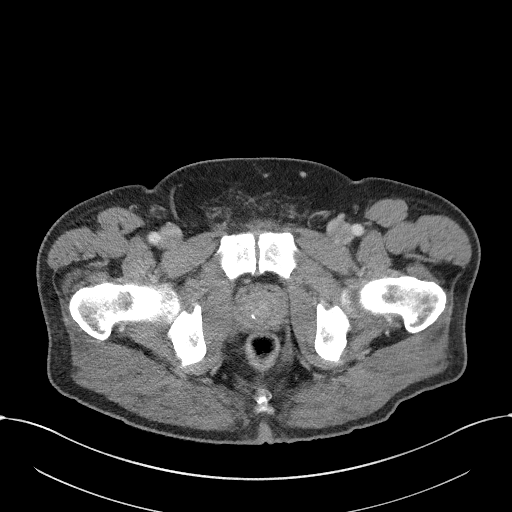
[im 15/104  bone]
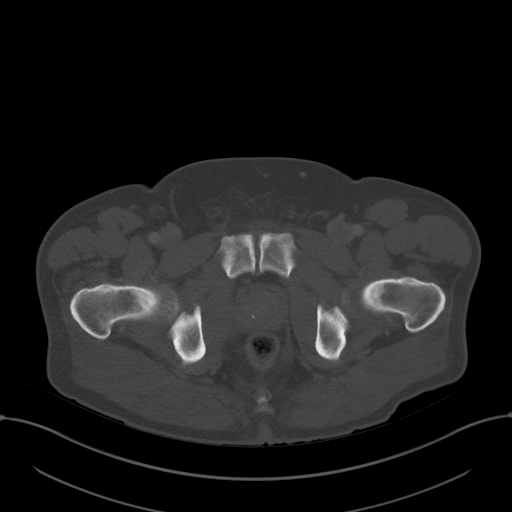
[im 30/104  soft-tissue]
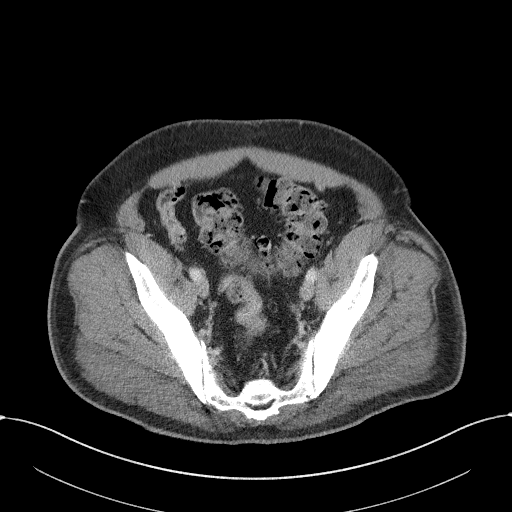
[im 45/104  soft-tissue]
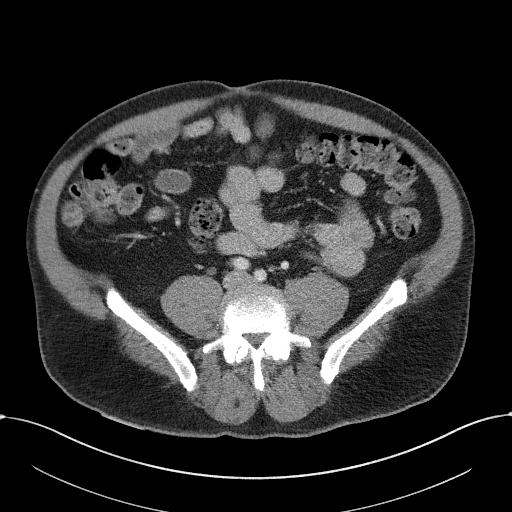
[im 45/104  lung]
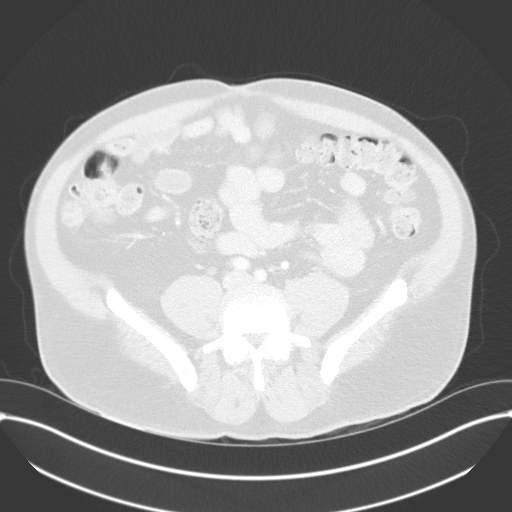
[im 59/104  soft-tissue]
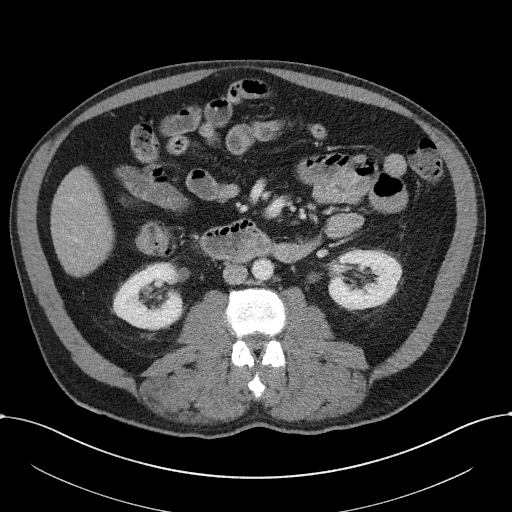
[im 59/104  lung]
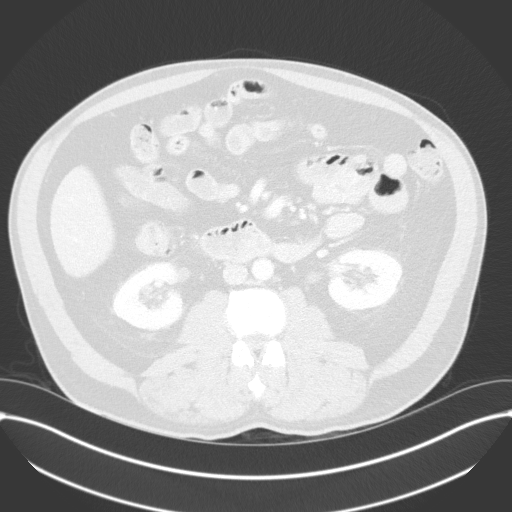
[im 74/104  soft-tissue]
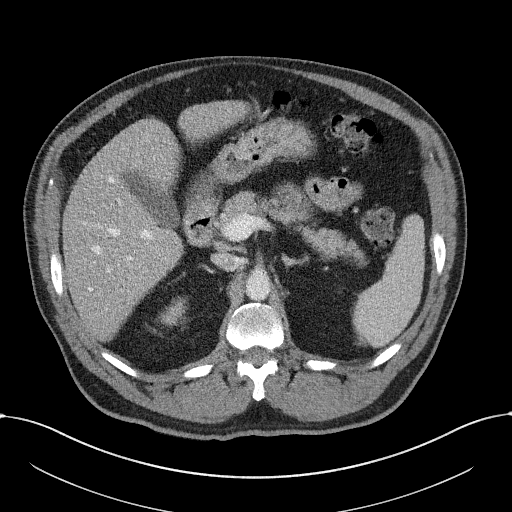
[im 74/104  lung]
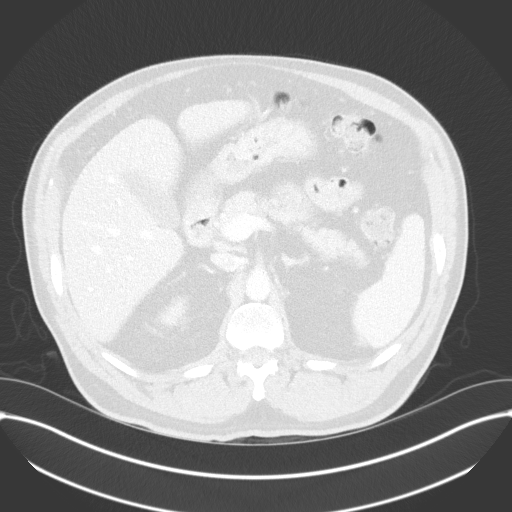
[im 89/104  soft-tissue]
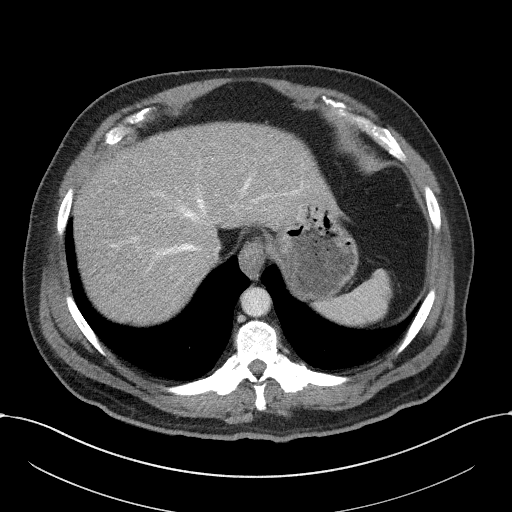
[im 89/104  lung]
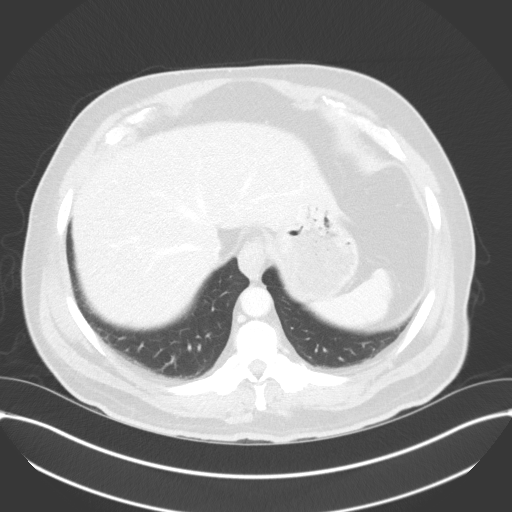

[Series 13: coronal venous mpr · coronal · portal-venous · 0.82mm/px · 2 of 160 slices shown, 3 images]
[im 54/160  soft-tissue]
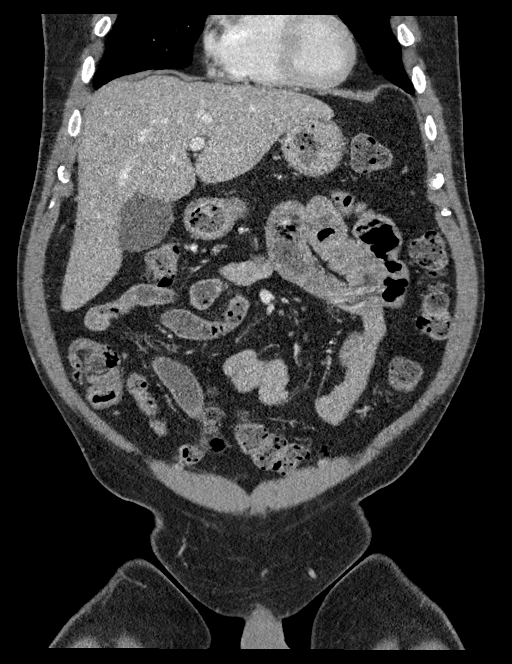
[im 54/160  bone]
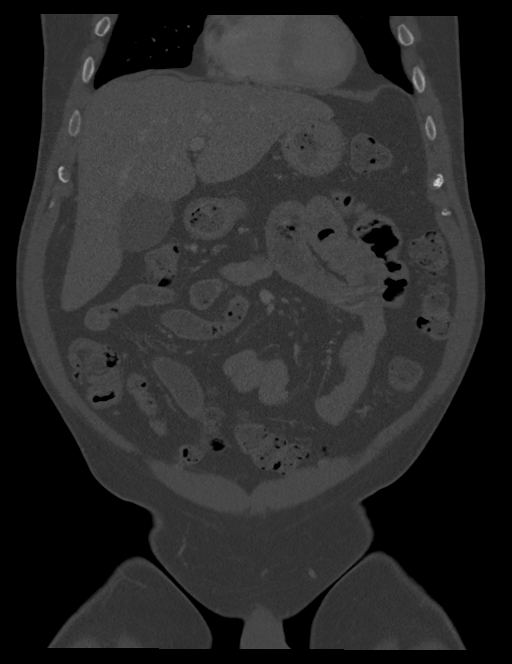
[im 107/160  soft-tissue]
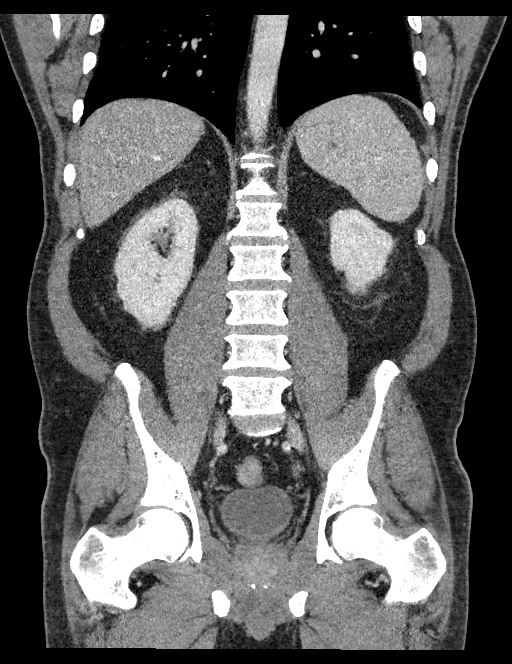

[11 of 46 positions shown; findings below may reference images not displayed]

FINDINGS: VASCULAR

Aorta: Unremarkable course, caliber, contour of the abdominal aorta.
No dissection, aneurysm, or periaortic fluid.

Celiac: Minimal calcifications at the origin the celiac artery with
no evidence of stenosis. Typical branch pattern of the celiac
artery, with left gastric, common hepatic, and splenic artery
identified.

SMA: No significant atherosclerotic changes of the superior
mesenteric artery.

Renals: Renal arteries are patent. Single right renal artery.
Dominant left renal artery with accessory left renal artery.

IMA: Inferior mesenteric artery is patent. Left colic artery patent.
Superior rectal artery patent.

Right lower extremity:

Unremarkable course, caliber, and contour of the right iliac system.
No aneurysm, dissection, or occlusion. Hypogastric artery is patent.
Anterior and posterior division patent. Common femoral artery
patent. Proximal SFA and profunda femoris patent.

Left lower extremity:

Unremarkable course, caliber, and contour of the left iliac system.
No aneurysm, dissection, or occlusion. Hypogastric artery is patent.
Anterior and posterior division patent. Common femoral artery
patent. Proximal SFA and profunda femoris patent.

Veins: Unremarkable appearance of the venous system.

Review of the MIP images confirms the above findings.

NON-VASCULAR

Lower chest: Small hiatal hernia

Hepatobiliary: Diffusely hypodense attenuation of liver parenchyma.
Focal fatty sparing surrounding the gallbladder. Unremarkable
appearance of the gallbladder.

Pancreas: Unremarkable appearance of the pancreas.

Spleen: Unremarkable.

Adrenals/Urinary Tract: Unremarkable appearance of adrenal glands.

Right:

No hydronephrosis. Symmetric perfusion to the left. No
nephrolithiasis. Unremarkable course of the right ureter.

Left:

No hydronephrosis. Symmetric perfusion to the right. No
nephrolithiasis. Unremarkable course of the left ureter.

Unremarkable appearance of the urinary bladder .

Stomach/Bowel: Unremarkable appearance of the stomach. Unremarkable
appearance of small bowel. No evidence of obstruction. Diverticular
disease without acute findings. Normal appendix.

Lymphatic: Multiple lymph nodes in the para-aortic nodal station,
none of which are enlarged.

Mesenteric: No free fluid or air. No adenopathy.

Reproductive: Unremarkable appearance of the pelvic organs.

Other: No hernia.

Musculoskeletal: No evidence of acute fracture. No bony canal
narrowing. No significant degenerative changes of the hips.
IMPRESSION: VASCULAR

No acute finding.

Minimal aortic atherosclerosis.

NON-VASCULAR

Small hiatal hernia.

Steatosis.

Diverticular disease without evidence of acute diverticulitis.

## 2016-08-19 IMAGING — CT CT RENAL STONE PROTOCOL
2 of 4 series · 16 of 46 positions shown, 18 images · non-contrast
Comparison: [DATE] CT of the abdomen and pelvis.

CLINICAL DATA: 60 y/o  M; right flank pain.

EXAM:
CT ABDOMEN AND PELVIS WITHOUT CONTRAST
TECHNIQUE: Multidetector CT imaging of the abdomen and pelvis was performed
following the standard protocol without IV contrast.

[Series 2: stone full standard · axial · 0.79mm/px · z∈[-948,-458]mm · 13 of 108 slices shown, 15 images]
[im 5/108  soft-tissue]
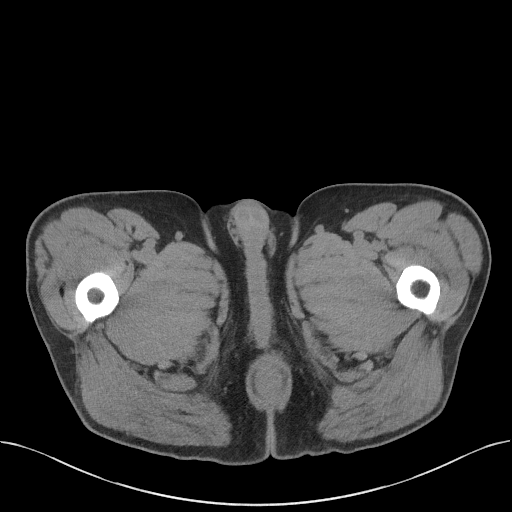
[im 5/108  bone]
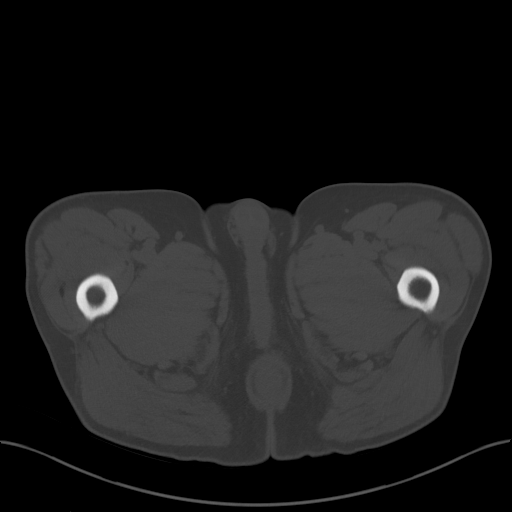
[im 14/108  soft-tissue]
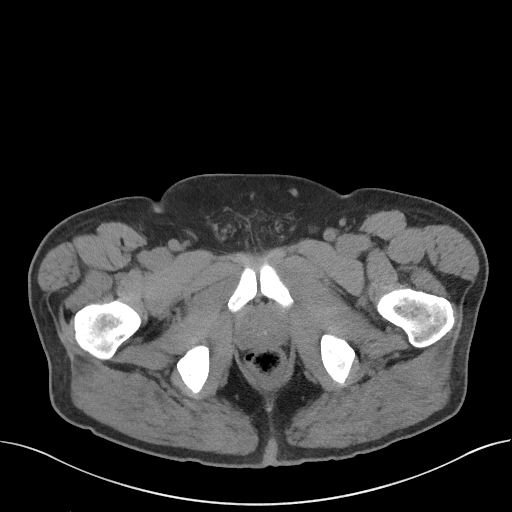
[im 23/108  soft-tissue]
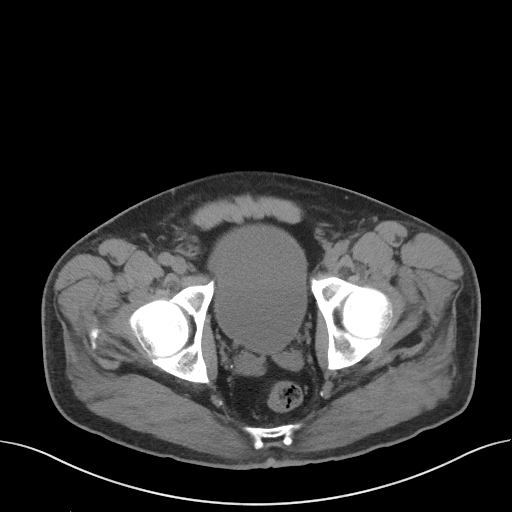
[im 32/108  soft-tissue]
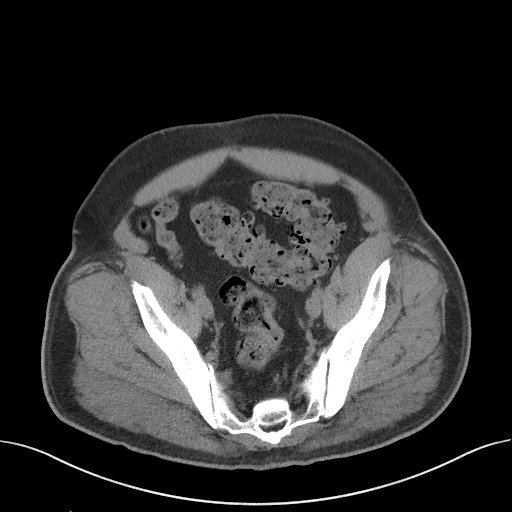
[im 36/108  soft-tissue]
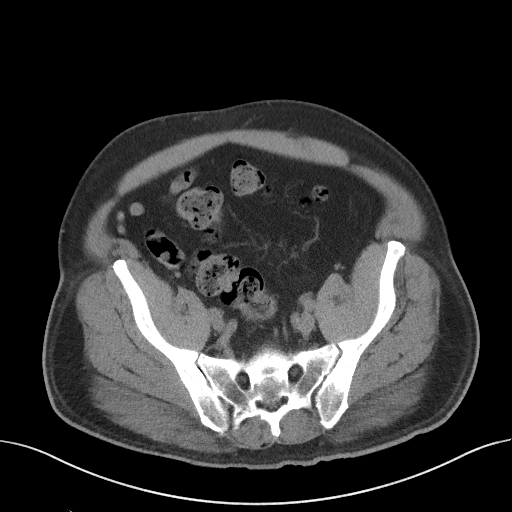
[im 45/108  soft-tissue]
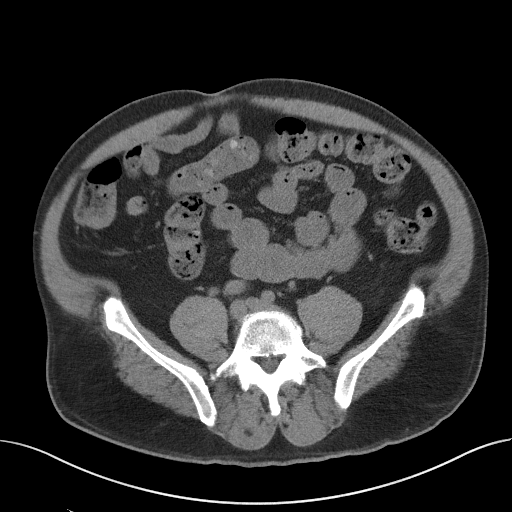
[im 54/108  soft-tissue]
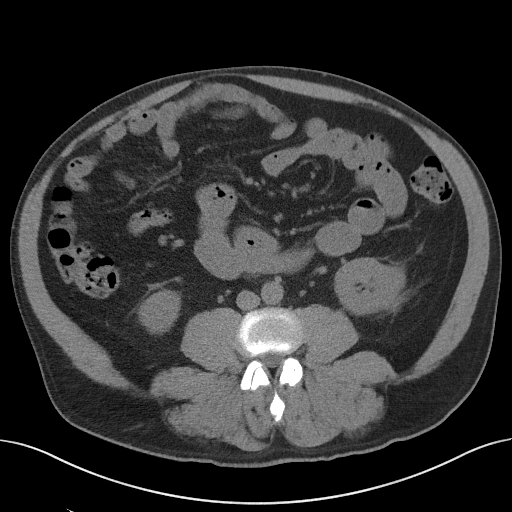
[im 63/108  soft-tissue]
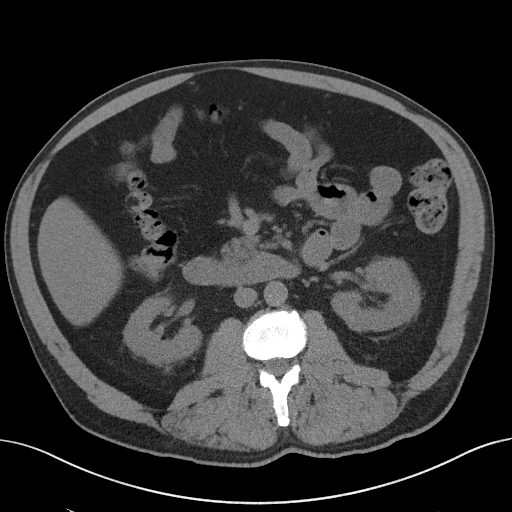
[im 72/108  soft-tissue]
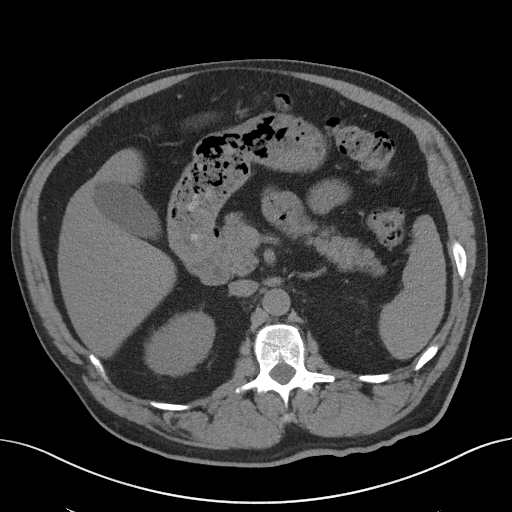
[im 72/108  bone]
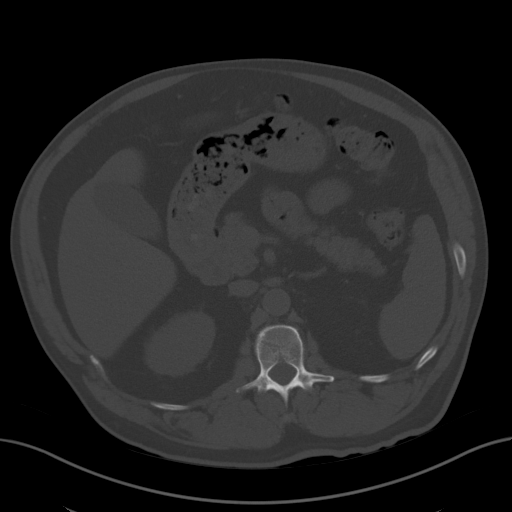
[im 76/108  soft-tissue]
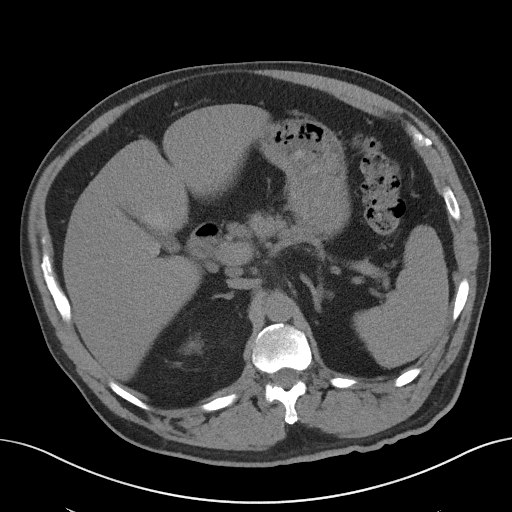
[im 85/108  soft-tissue]
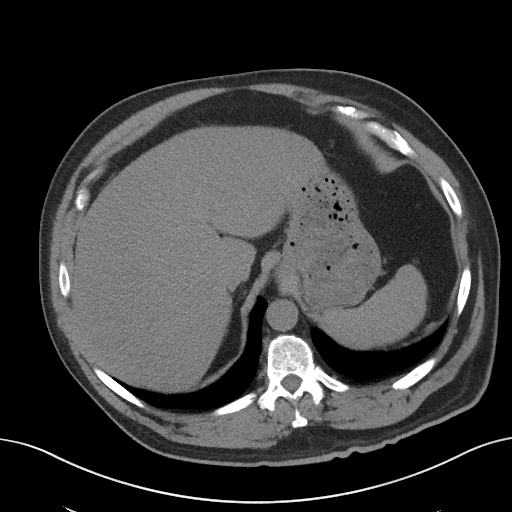
[im 94/108  soft-tissue]
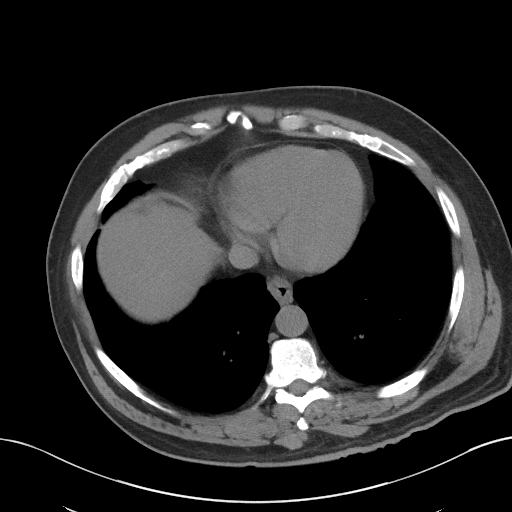
[im 103/108  soft-tissue]
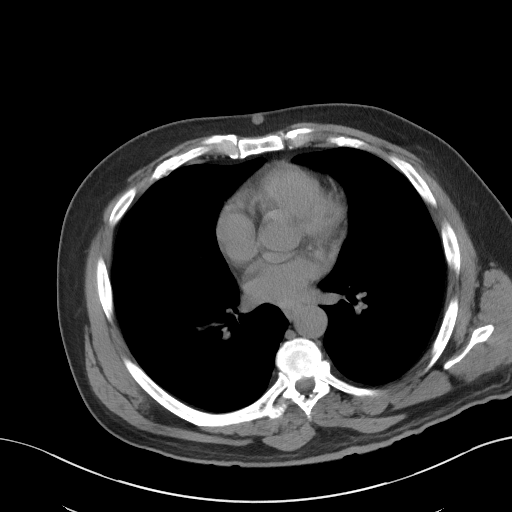

[Series 5: coronal · coronal · 0.78mm/px · 3 of 164 slices shown]
[im 55/164  soft-tissue]
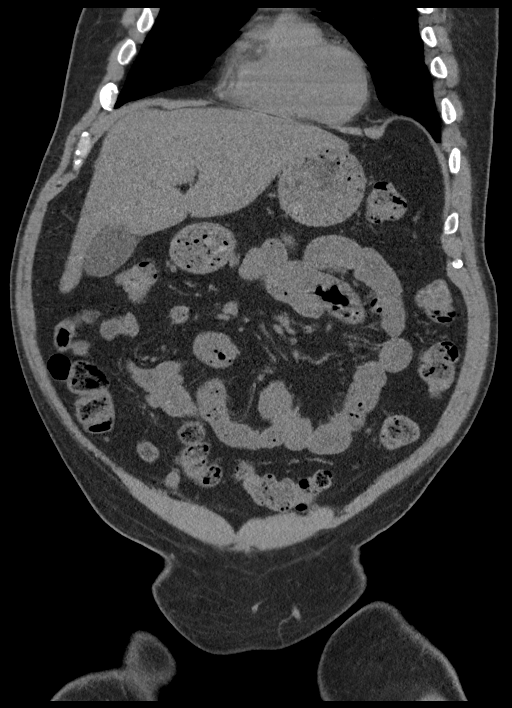
[im 73/164  soft-tissue]
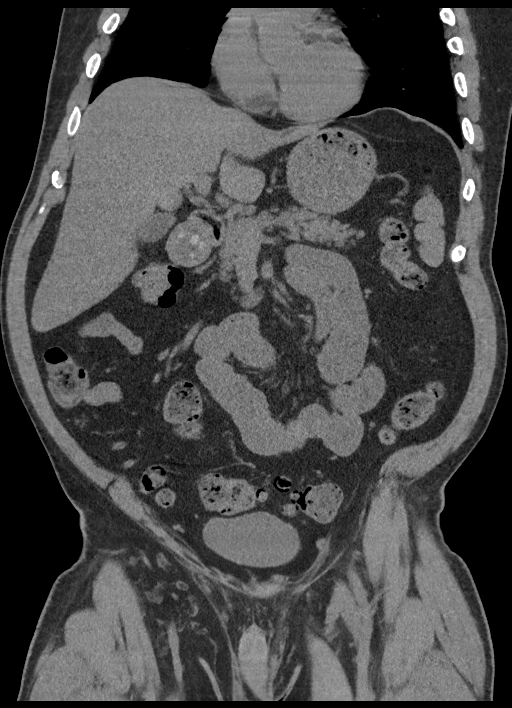
[im 91/164  soft-tissue]
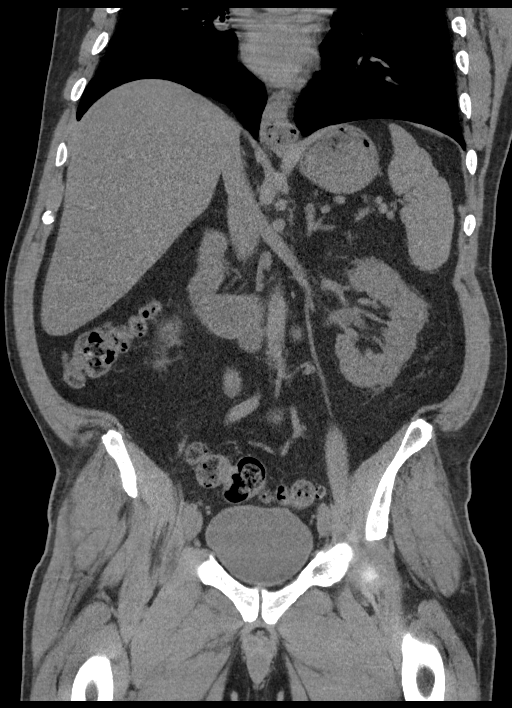

[16 of 46 positions shown; findings below may reference images not displayed]

FINDINGS: Lower chest: Small hiatal hernia.

Hepatobiliary: Hepatic steatosis. No focal liver lesion. Normal
gallbladder. No intra or extrahepatic biliary ductal dilatation.

Pancreas: Unremarkable. No pancreatic ductal dilatation or
surrounding inflammatory changes.

Spleen: Normal in size without focal abnormality.

Adrenals/Urinary Tract: Normal adrenal glands. Right kidney
interpolar subcentimeter stable hemorrhagic cyst. Nonspecific stable
perinephric stranding. No urinary stone disease or obstructive
uropathy. Normal bladder.

Stomach/Bowel: Stomach is within normal limits. Appendix appears
normal. No evidence of bowel wall thickening, distention, or
inflammatory changes. Scattered sigmoid diverticulosis.

Vascular/Lymphatic: Aortic atherosclerosis. No enlarged abdominal or
pelvic lymph nodes.

Reproductive: Prostate calcifications.

Other: Stable 9 mm dermal nodule in the midline over the lower
sternum probably a sebaceous cyst (series 2, image 6).

Musculoskeletal: Mild degenerative changes of the spine and lower
lumbar facet arthropathy.
IMPRESSION: 1. No urinary stone disease or obstructive uropathy. No acute
processes as explanation for pain identified.
2. Hepatic steatosis.
3. Aortic atherosclerosis.
4. Scattered sigmoid diverticulosis.

By: KO M.D.

## 2016-08-19 MED ORDER — IOPAMIDOL (ISOVUE-370) INJECTION 76%
100.0000 mL | Freq: Once | INTRAVENOUS | Status: AC | PRN
  Administered 2016-08-19: 100 mL via INTRAVENOUS

## 2016-08-19 MED ORDER — ONDANSETRON HCL 4 MG/2ML IJ SOLN
4.0000 mg | Freq: Once | INTRAMUSCULAR | Status: AC | PRN
  Administered 2016-08-19: 4 mg via INTRAVENOUS
  Filled 2016-08-19: qty 2

## 2016-08-19 MED ORDER — SODIUM CHLORIDE 0.9 % IV BOLUS (SEPSIS)
500.0000 mL | Freq: Once | INTRAVENOUS | Status: AC
  Administered 2016-08-19: 500 mL via INTRAVENOUS

## 2016-08-19 MED ORDER — NAPROXEN 500 MG PO TABS
500.0000 mg | ORAL_TABLET | Freq: Once | ORAL | Status: AC
  Administered 2016-08-19: 500 mg via ORAL
  Filled 2016-08-19: qty 1

## 2016-08-19 MED ORDER — FENTANYL CITRATE (PF) 100 MCG/2ML IJ SOLN
50.0000 ug | INTRAMUSCULAR | Status: DC | PRN
  Administered 2016-08-19: 50 ug via INTRAVENOUS
  Filled 2016-08-19: qty 2

## 2016-08-19 MED ORDER — NAPROXEN 500 MG PO TABS: 500.0000 mg | ORAL_TABLET | Freq: Two times a day (BID) | ORAL | 0 refills | Status: DC

## 2016-08-19 MED ORDER — MORPHINE SULFATE (PF) 4 MG/ML IV SOLN
4.0000 mg | Freq: Once | INTRAVENOUS | Status: AC
  Administered 2016-08-19: 4 mg via INTRAVENOUS
  Filled 2016-08-19: qty 1

## 2016-08-19 NOTE — ED Notes (Signed)
Patient transported to CT 

## 2016-08-19 NOTE — Discharge Instructions (Signed)

## 2016-08-19 NOTE — ED Notes (Signed)
Waiting for fluids to finish and then will be discharged.

## 2016-08-19 NOTE — ED Provider Notes (Signed)
[facility] Emergency Department Provider Note  ____________________________________________  Time seen: Approximately 4:29 AM  I have reviewed the triage vital signs and the nursing notes.   HISTORY  Chief Complaint Flank Pain   HPI Jeffrey [name] is a 61 y.o. male a history of diabetes, hypertension and hyperlipidemia who presents for evaluation of right flank/right lower quadrant abdominal pain. Patient reports that the pain started suddenly last night while he was sitting on the couch watching TV. The patient reports that the pain is currently [id number], colicky cramping in quality, initially located in the right flank right now has migrated to the right lower quadrant radiating down to his groin. He reports that the pain is similar to prior kidney stones that he has had. He has undergone lithotripsy and ureteral stenting in the past for kidney stones with the last one about 10 years ago. He denies hematuria or dysuria, chest pain or shortness of breath, fever or chills. He denies testicular pain or swelling. He endorses nausea but no vomiting, no diarrhea.  Past Medical History:  Diagnosis Date  . Actinic keratosis   . Cholecystitis   . Diabetes mellitus ([place])   . Diverticulosis   . Erectile dysfunction   . GERD (gastroesophageal reflux disease)   . Hyperlipidemia   . Hypertension   . Myalgia   . Obesity   . Peyronie's disease   . Plantar fasciitis   . Seasonal allergies     Patient Active Problem List   Diagnosis Date Noted  . Sinusitis 09/08/2015  . Hypertriglyceridemia 07/19/2015  . Pancreatitis 07/16/2015  . ED (erectile dysfunction) of organic origin 04/18/2015  . Hypertension 12/06/2014  . Seasonal allergies 12/06/2014  . Cholecystitis 12/06/2014  . Diverticulosis 12/06/2014  . GERD (gastroesophageal reflux disease) 12/06/2014  . Diabetes mellitus ([place]) 12/06/2014  . Plantar fasciitis 12/06/2014  . Actinic keratosis 12/06/2014  .  Peyronie's disease 12/06/2014  . Erectile dysfunction 12/06/2014  . Obesity 12/06/2014  . Hyperlipidemia 12/06/2014  . Myalgia 12/06/2014    Past Surgical History:  Procedure Laterality Date  . ARTHROSCOPIC REPAIR ACL    . HERNIA REPAIR    . LITHOTRIPSY    . MENISCUS REPAIR      Prior to Admission medications   Medication Sig Start Date End Date Taking? Authorizing Provider  albuterol (PROVENTIL HFA;VENTOLIN HFA) 108 (90 Base) MCG/ACT inhaler Inhale 2 puffs into the lungs every 4 (four) hours as needed for wheezing or shortness of breath.   Yes Historical Provider, MD  amLODipine (NORVASC) 10 MG tablet Take 1 tablet by mouth  daily 01/26/16  Yes Jerrol Banana., MD  aspirin 81 MG tablet Take 81 mg by mouth daily.   Yes Historical Provider, MD  empagliflozin (JARDIANCE) 10 MG TABS tablet Take 10 mg by mouth daily. 06/17/16  Yes Richard Maceo Pro., MD  fenofibrate 160 MG tablet Take 1 tablet by mouth  daily 12/29/15  Yes Richard Maceo Pro., MD  fluticasone [facility]) 50 MCG/ACT nasal spray Place 1 spray into both nostrils daily. 05/04/15  Yes Richard Maceo Pro., MD  glipiZIDE (GLUCOTROL) 10 MG tablet Take 0.5 tablets (5 mg total) by mouth daily before breakfast. 09/15/15  Yes Jerrol Banana., MD  glucose blood (ONETOUCH VERIO) test strip To check blood sugar twice a day. DX E11.9 04/01/16  Yes Richard Maceo Pro., MD  loratadine (CLARITIN) 10 MG tablet Take 1 tablet (10 mg total) by mouth daily. 04/25/16  Yes  Richard Maceo Pro., MD  losartan (COZAAR) 100 MG tablet Take 1 tablet by mouth  daily 01/26/16  Yes Richard Maceo Pro., MD  metFORMIN (GLUCOPHAGE) 1000 MG tablet Take 1 tablet (1,000 mg total) by mouth 2 (two) times daily with a meal. 09/15/15  Yes Jerrol Banana., MD  naproxen (NAPROSYN) 500 MG tablet Take 1 tablet (500 mg total) by mouth 2 (two) times daily as needed. Patient taking differently: Take 500 mg by mouth daily as needed.  01/29/16  Yes Richard Maceo Pro., MD  omega-3 acid ethyl esters (LOVAZA) 1 g capsule Take 1 capsule (1 g total) by mouth 2 (two) times daily. 08/09/16  Yes Richard Maceo Pro., MD  omeprazole (PRILOSEC) 20 MG capsule TAKE 1 CAPSULE BY MOUTH  DAILY Patient taking differently: TAKE 1 CAPSULE BY TWICE DAILY 08/15/16  Yes Richard Maceo Pro., MD  [facility] LANCETS [id number] MISC Check sugar once daily DX E11.9 04/25/16  Yes Richard Maceo Pro., MD  pravastatin (PRAVACHOL) 40 MG tablet TAKE 1 TABLET BY MOUTH  DAILY AT 6 PM. 07/31/16  Yes Jerrol Banana., MD  sildenafil (VIAGRA) 100 MG tablet Take 100 mg by mouth daily as needed for erectile dysfunction.   Yes Historical Provider, MD  traMADol (ULTRAM) 50 MG tablet Take by mouth every 6 (six) hours as needed.   Yes Historical Provider, MD  oseltamivir (TAMIFLU) 75 MG capsule Take 1 capsule (75 mg total) by mouth 2 (two) times daily. Patient not taking: Reported on 08/19/2016 08/13/16   Jerrol Banana., MD    Allergies Codeine  Family History  Problem Relation Age of Onset  . Hypertension Father   . Heart attack Father   . Drug abuse Sister     Secondary to head injury after MVA  . Club foot Son     Bilat  . Heart disease Brother   . Heart disease Brother   . Cancer Maternal Grandmother   . Diabetes Maternal Grandfather   . Cancer Paternal Grandmother   . Hypertension Paternal Grandfather   . Heart attack Paternal Grandfather     Social History Social History  Substance Use Topics  . Smoking status: Former Smoker    Packs/day: 1.50    Types: Cigarettes    Quit date: 08/11/1984  . Smokeless tobacco: Never Used  . Alcohol use No    Review of Systems  Constitutional: Negative for fever. Eyes: Negative for visual changes. ENT: Negative for sore throat. Neck: No neck pain  Cardiovascular: Negative for chest pain. Respiratory: Negative for shortness of breath. Gastrointestinal: + RLQ abdominal pain and nausea. No vomiting or  diarrhea. Genitourinary: Negative for dysuria. Musculoskeletal: Negative for back pain. + R flank pain Skin: Negative for rash. Neurological: Negative for headaches, weakness or numbness. Psych: No SI or HI  ____________________________________________   PHYSICAL EXAM:  VITAL SIGNS: ED Triage Vitals  Enc Vitals Group     BP 08/19/16 0252 (!) 154/80     Pulse Rate 08/19/16 0252 88     Resp 08/19/16 0252 18     Temp 08/19/16 0252 97.5 F (36.4 C)     Temp Source 08/19/16 0252 Oral     SpO2 08/19/16 0252 98 %     Weight 08/19/16 0254 215 lb (97.5 kg)     Height 08/19/16 0254 5\' 7"  (1.702 m)     Head Circumference --      Peak Flow --  Pain Score 08/19/16 0254 8     Pain Loc --      Pain Edu? --      Excl. in [place]? --     Constitutional: Alert and oriented, in moderate distress due to the pain.  HEENT:      Head: Normocephalic and atraumatic.         Eyes: Conjunctivae are normal. Sclera is non-icteric. EOMI. PERRL      Mouth/Throat: Mucous membranes are moist.       Neck: Supple with no signs of meningismus. Cardiovascular: Regular rate and rhythm. No murmurs, gallops, or rubs. 2+ symmetrical distal pulses are present in all extremities. No JVD. Respiratory: Normal respiratory effort. Lungs are clear to auscultation bilaterally. No wheezes, crackles, or rhonchi.  Gastrointestinal: Soft, ttp over the RLQ, and non distended with positive bowel sounds. No rebound or guarding. Genitourinary: No CVA tenderness. Bilateral testicles are descended with no tenderness to palpation, bilateral positive cremasteric reflexes are present, no swelling or erythema of the scrotum. No evidence of inguinal hernia. Musculoskeletal: Nontender with normal range of motion in all extremities. No edema, cyanosis, or erythema of extremities. Neurologic: Normal speech and language. Face is symmetric. Moving all extremities. No gross focal neurologic deficits are appreciated. Skin: Skin is warm, dry  and intact. No rash noted. Psychiatric: Mood and affect are normal. Speech and behavior are normal.  ____________________________________________   LABS (all labs ordered are listed, but only abnormal results are displayed)  Labs Reviewed  URINALYSIS, COMPLETE (UACMP) WITH MICROSCOPIC - Abnormal; Notable for the following:       Result Value   Color, Urine YELLOW (*)    APPearance CLEAR (*)    Glucose, UA >=500 (*)    All other components within normal limits  BASIC METABOLIC PANEL - Abnormal; Notable for the following:    Chloride 100 (*)    Glucose, Bld 191 (*)    BUN 31 (*)    Creatinine, Ser 1.44 (*)    GFR calc non Af Amer 51 (*)    GFR calc Af Amer 59 (*)    All other components within normal limits  CBC - Abnormal; Notable for the following:    RDW 14.7 (*)    All other components within normal limits  URINE CULTURE   ____________________________________________  EKG  none ____________________________________________  RADIOLOGY  CT renal: 1. No urinary stone disease or obstructive uropathy. No acute processes as explanation for pain identified. 2. Hepatic steatosis. 3. Aortic atherosclerosis. 4. Scattered sigmoid diverticulosis.   CTA Aorta: IMPRESSION: VASCULAR No acute finding. Minimal aortic atherosclerosis.  NON-VASCULAR Small hiatal hernia. Steatosis. Diverticular disease without evidence of acute diverticulitis. ____________________________________________   PROCEDURES  Procedure(s) performed: None Procedures Critical Care performed:  None ____________________________________________   INITIAL IMPRESSION / ASSESSMENT AND PLAN / ED COURSE  61 y.o. male a history of diabetes, hypertension and hyperlipidemia who presents for evaluation of right flank/right lower quadrant abdominal pain associated with nausea and similar to prior kidney stones. Patient is in obvious distress due to pain, he is tender to palpation on the right lower quadrant  with no rebound or guarding, no flank tenderness, GU exam is within normal limits. Differential diagnoses including versus pyelonephritis versus sciatica pain versus appendicitis. UA with no evidence of urinary tract infection or blood. Patient with mild AK I with creatinine of 1.44, we'll give IV fluids. Normal CBC and BMP otherwise. We'll give IV morphine, IV Zofran and IV fluids. Will get CT renal protocol.  Clinical Course as of Aug 20 731  Mon Aug 19, 2016  [id number] CT renal with no evidence of a kidney stone or appendicitis. Patient continues to have significant pain. Will pursue CT with contrast to rule out dissection or AAA.  [CV]  [id number] CTA with no acute findings. Pain is improved, probably sciatica pain. Abdominal exam with no tenderness at this time. Normal DTRs, strength and sensation of b/l LE, no evidence of cauda equina. Patient will be discharged home with close f/u with PCP for re-evaluation.  [CV]    Clinical Course User Index [CV] Rudene Re, MD    Pertinent labs & imaging results that were available during my care of the patient were reviewed by me and considered in my medical decision making (see chart for details).    ____________________________________________   FINAL CLINICAL IMPRESSION(S) / ED DIAGNOSES  Final diagnoses:  Right flank pain  Right lower quadrant pain      NEW MEDICATIONS STARTED DURING THIS VISIT:  New Prescriptions   No medications on file     Note:  This document was prepared using Dragon voice recognition software and may include unintentional dictation errors.    Rudene Re, MD 08/19/16 [phone]

## 2016-08-19 NOTE — ED Triage Notes (Signed)
Patient with complaint of pain to right lower back that started about 7 hours ago. Patient states that he has had kidney stones and that this is a similar pain. Patient denies any urinary symptoms.

## 2016-08-20 ENCOUNTER — Other Ambulatory Visit: Payer: Self-pay | Admitting: Family Medicine

## 2016-08-20 LAB — URINE CULTURE: Culture: <10000 — AB

## 2016-08-20 MED ORDER — EMPAGLIFLOZIN 10 MG PO TABS: 10.0000 mg | ORAL_TABLET | Freq: Every day | ORAL | 3 refills | Status: DC

## 2016-08-20 NOTE — Telephone Encounter (Signed)
Pt called  [facility]aying [facility] [place] sent [facility] for more info for his rx of   empagliflozin (JARDIANCE) 10 MG TABS tablet. They have not heard anything back.  Pt needs to get this medication.

## 2016-08-20 NOTE — Telephone Encounter (Signed)
[facility] refill was 06/17/2016. Okay to refill? Renaldo Fiddler, CMA

## 2016-08-22 ENCOUNTER — Ambulatory Visit (INDEPENDENT_AMBULATORY_CARE_PROVIDER_SITE_OTHER): Payer: 59 | Admitting: Family Medicine

## 2016-08-22 ENCOUNTER — Ambulatory Visit
Admission: RE | Admit: 2016-08-22 | Discharge: 2016-08-22 | Disposition: A | Discharge status: Home / Self Care | Payer: 59 | Source: Ambulatory Visit | Attending: Family Medicine | Admitting: Family Medicine

## 2016-08-22 ENCOUNTER — Telehealth: Payer: Self-pay

## 2016-08-22 VITALS — BP 130/64 | HR 74 | Temp 97.7°F | Resp 16

## 2016-08-22 DIAGNOSIS — M5136 Other intervertebral disc degeneration, lumbar region: Secondary | ICD-10-CM | POA: Diagnosis not present

## 2016-08-22 DIAGNOSIS — M25551 Pain in right hip: Secondary | ICD-10-CM

## 2016-08-22 IMAGING — CR DG HIP (WITH OR WITHOUT PELVIS) 2-3V*R*
1 series · 3 of 3 positions shown · non-contrast
Comparison: None.

CLINICAL DATA: Acute right hip pain.

EXAM:
DG HIP (WITH OR WITHOUT PELVIS) 2-3V RIGHT

[Series 1: dg hip unilat w or w/o pelvis 2-3 views  · non-contrast · 0.14mm/px · 3 of 3 slices shown]
[im 1/3]
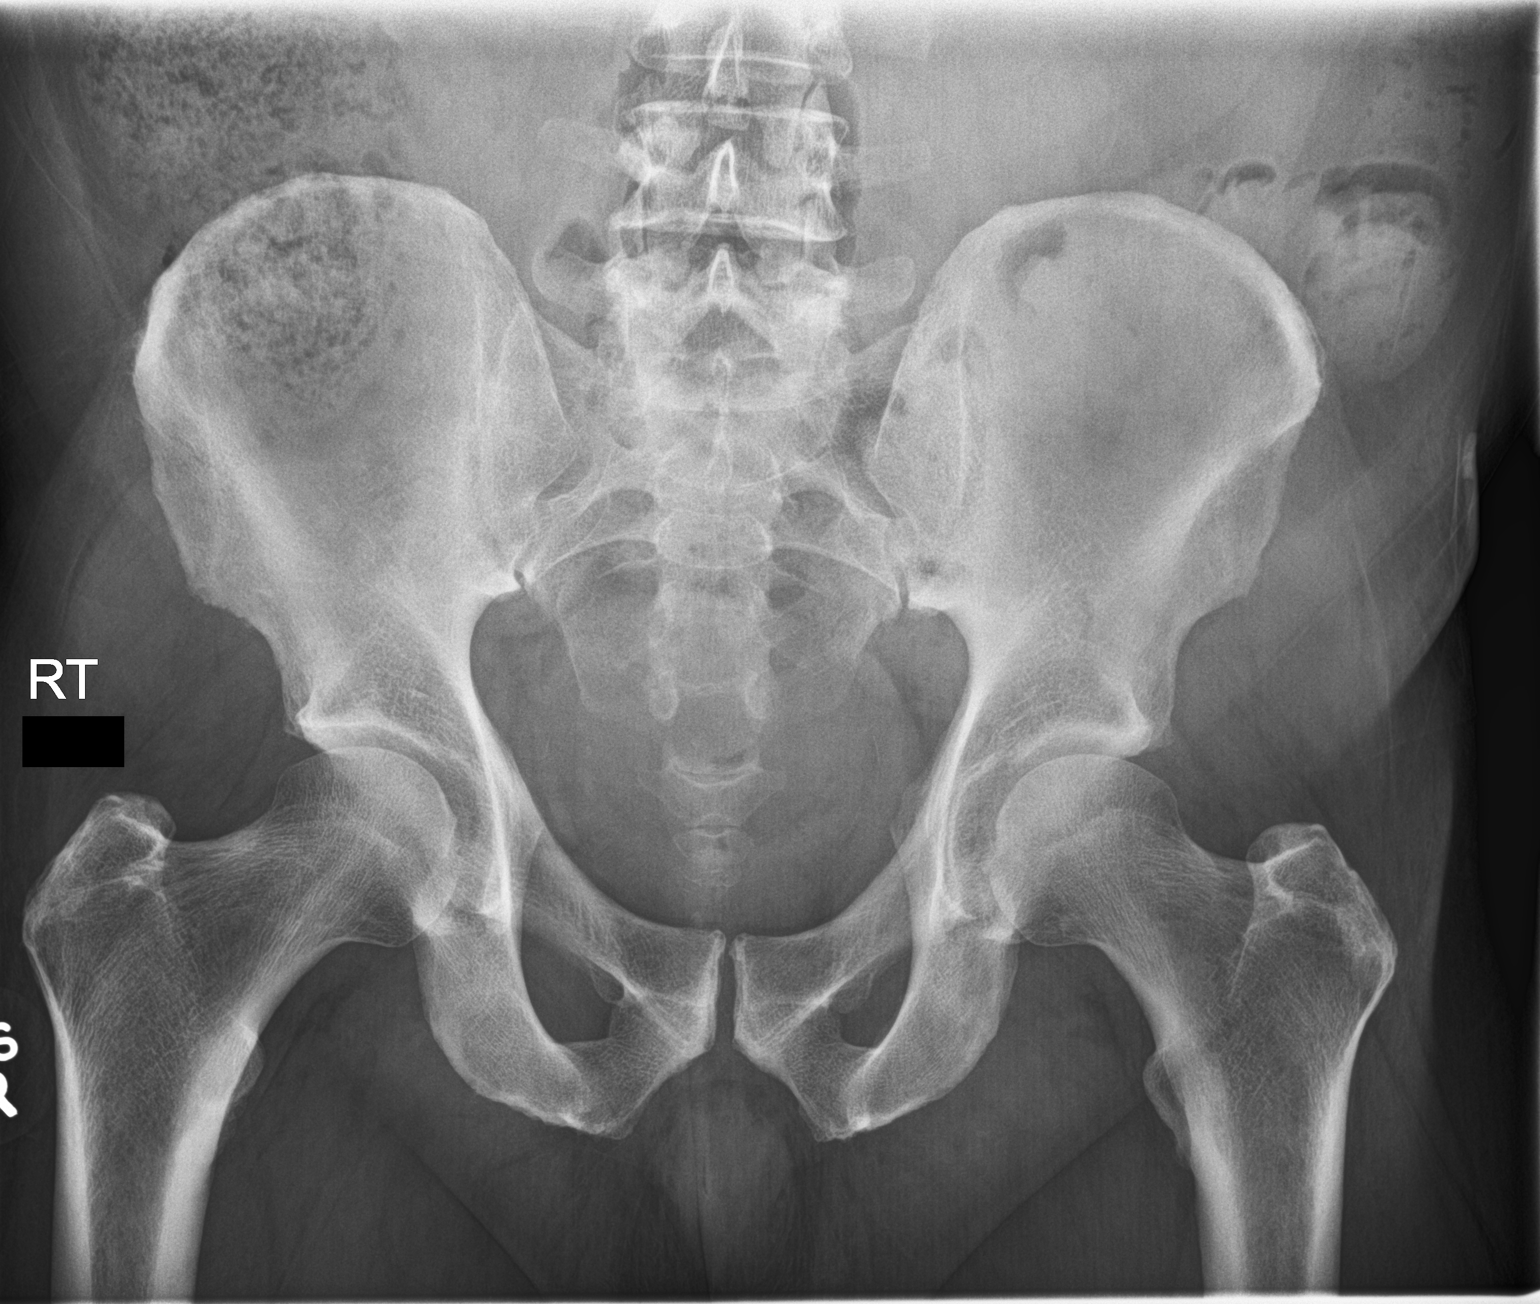
[im 2/3]
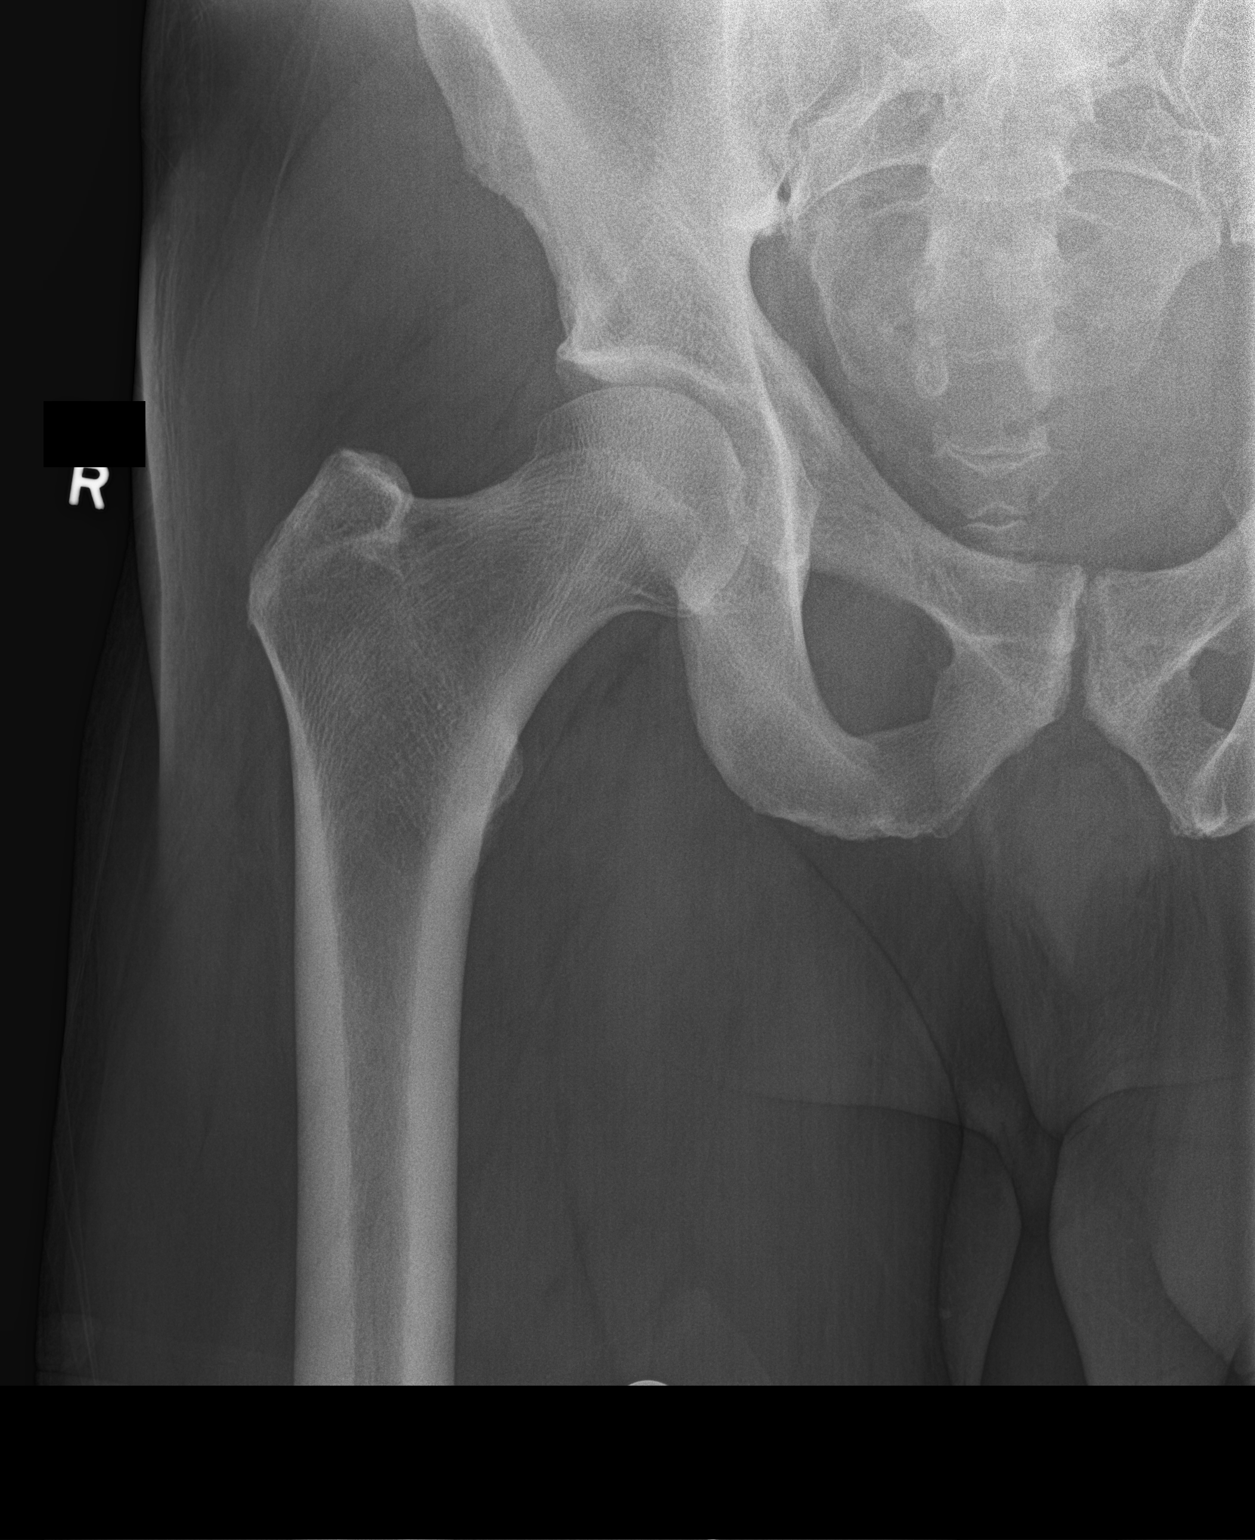
[im 3/3]
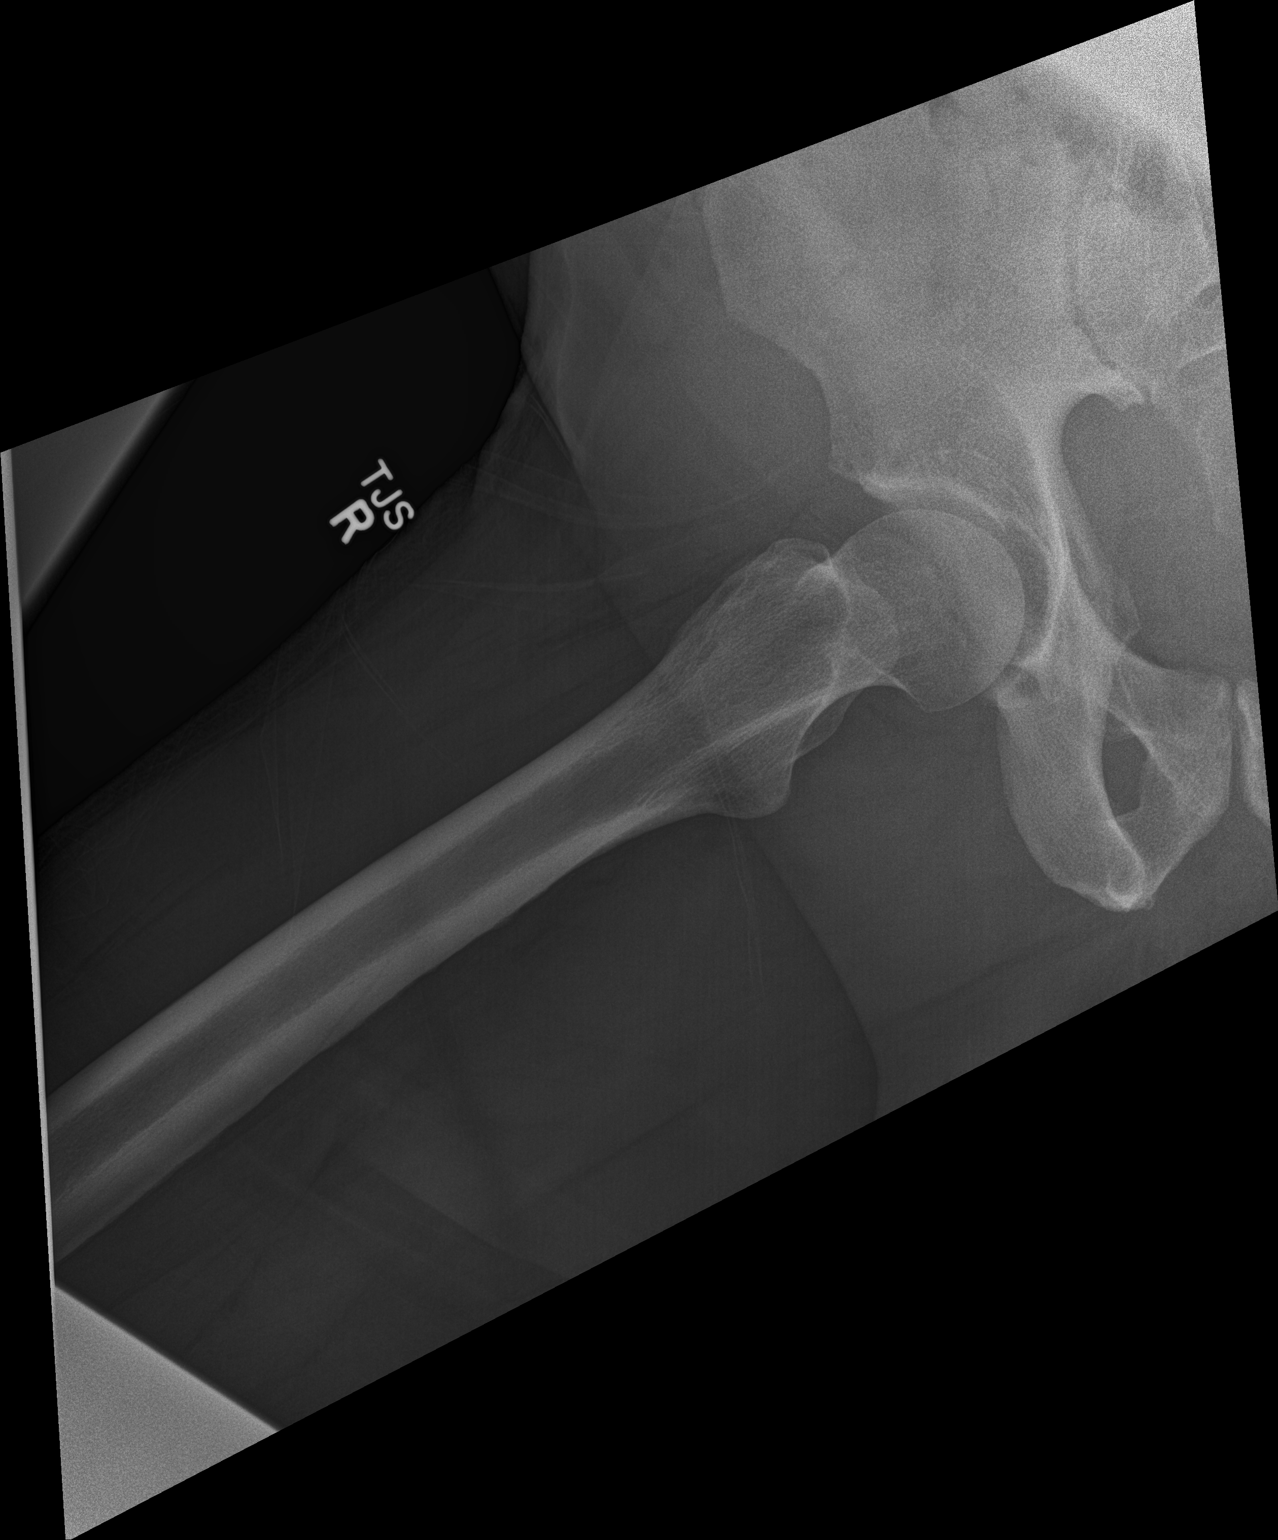

[3 of 3 positions shown; findings below may reference images not displayed]

FINDINGS: There is no evidence of hip fracture or dislocation. There is no
evidence of arthropathy or other focal bone abnormality.
IMPRESSION: Normal right hip.

## 2016-08-22 IMAGING — CR DG LUMBAR SPINE COMPLETE 4+V
1 series · 5 of 5 positions shown · non-contrast
Comparison: CT scan of [DATE].

CLINICAL DATA: Acute right lower back and leg pain.

EXAM:
LUMBAR SPINE - COMPLETE 4+ VIEW

[Series 1: dg lumbar spine complete 4 +v · 0.14mm/px · 5 of 5 slices shown]
[im 1/5]
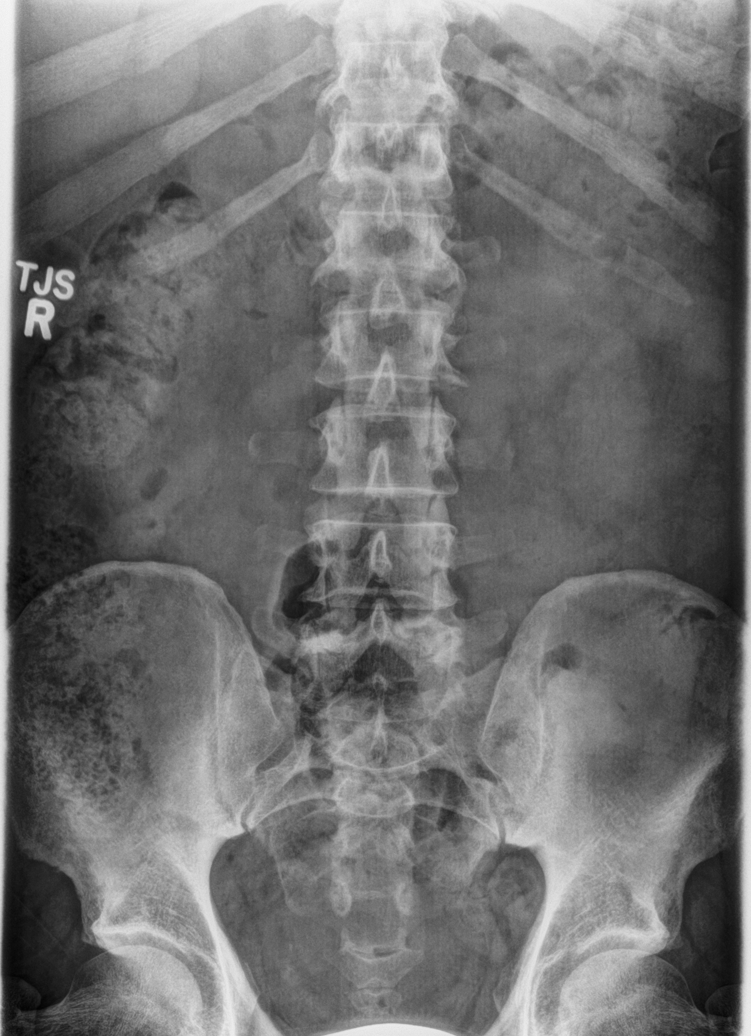
[im 2/5]
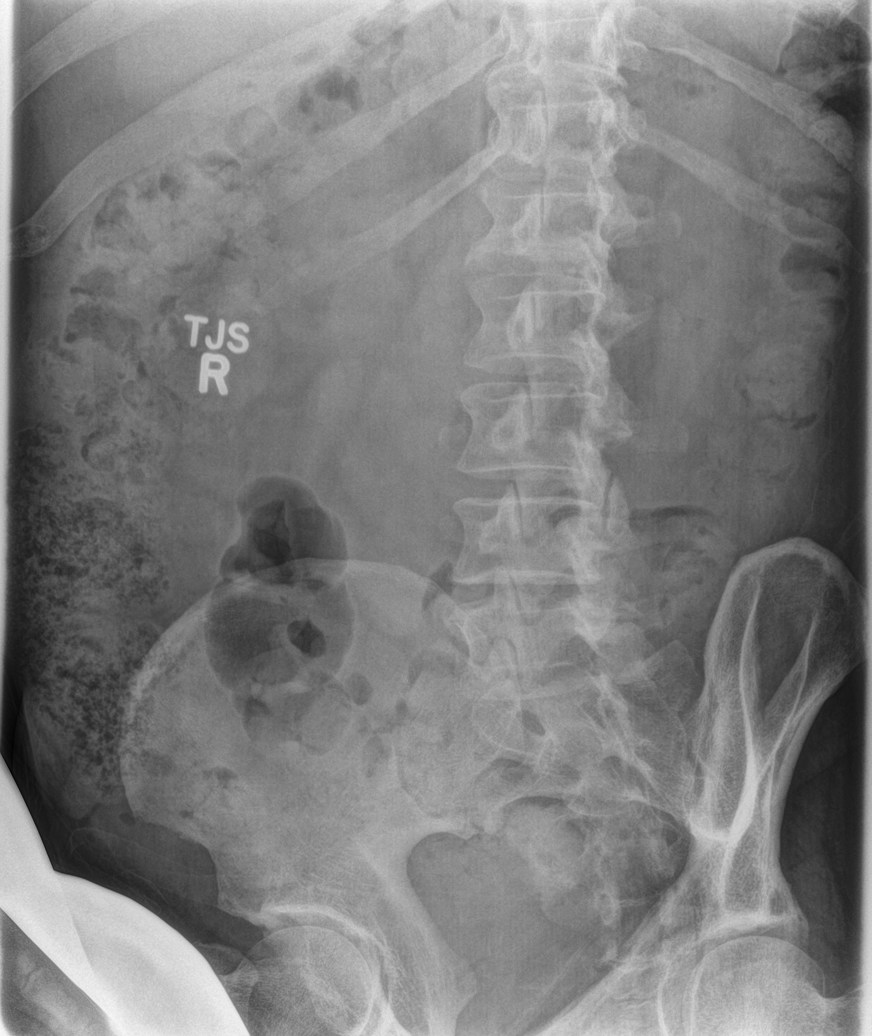
[im 3/5]
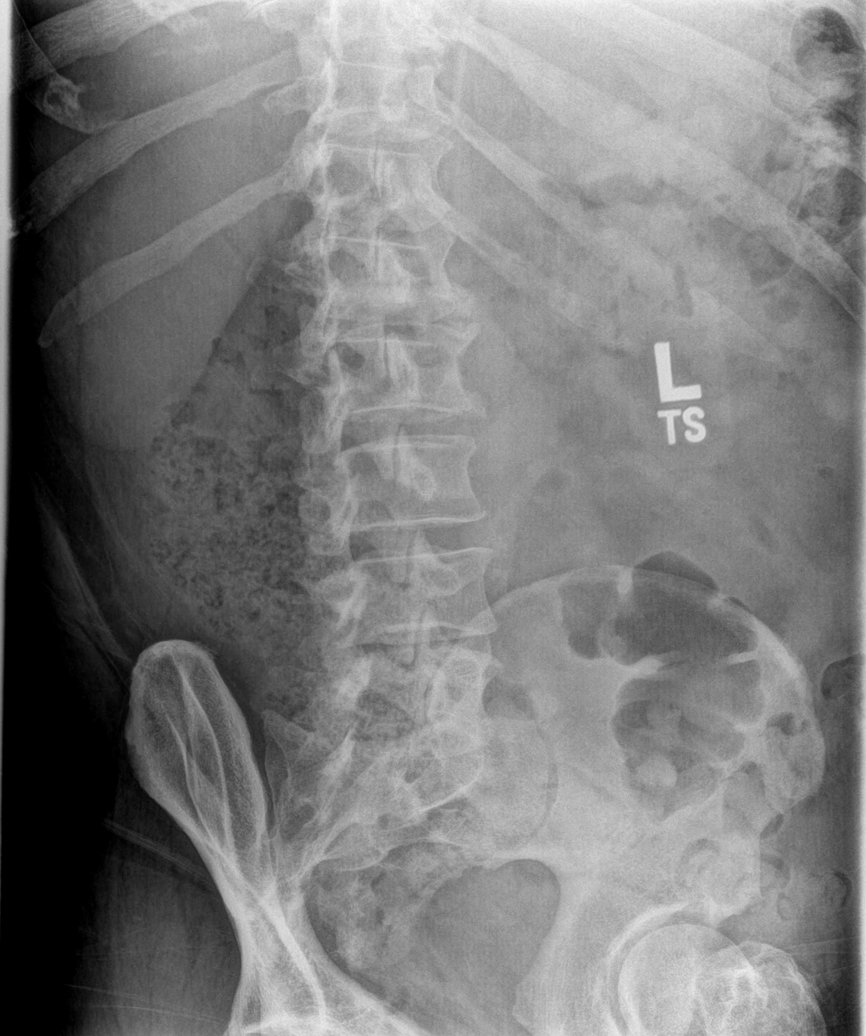
[im 4/5]
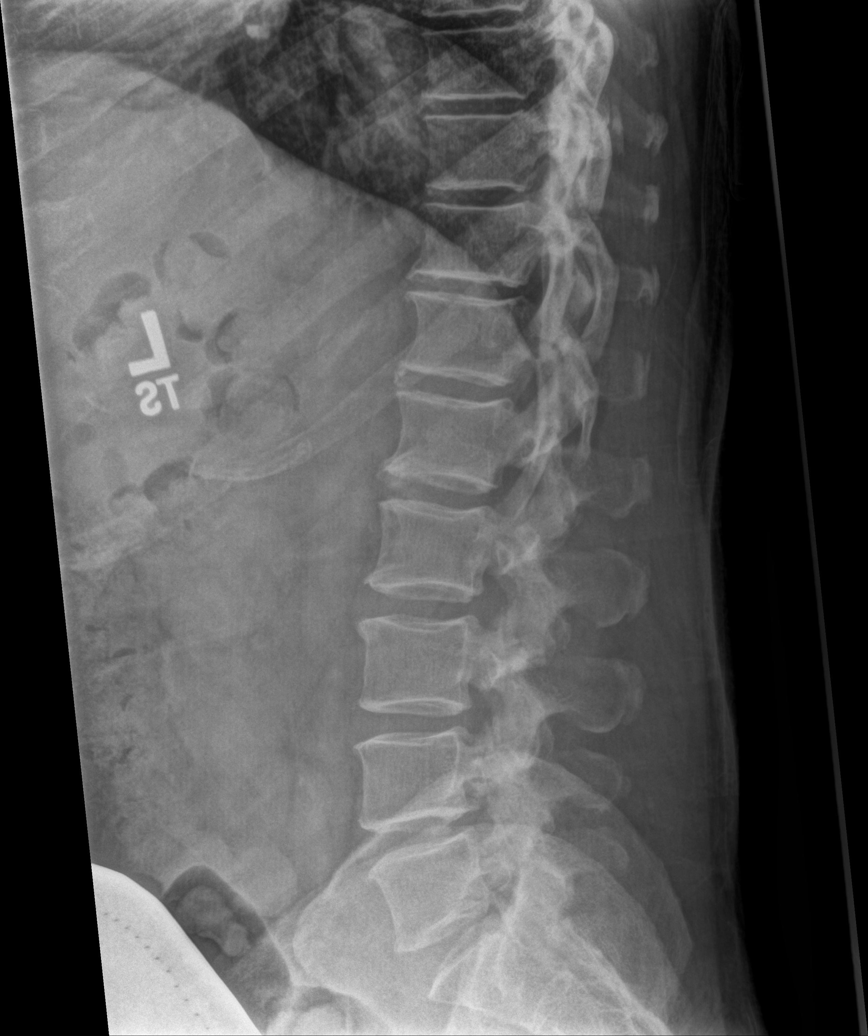
[im 5/5]
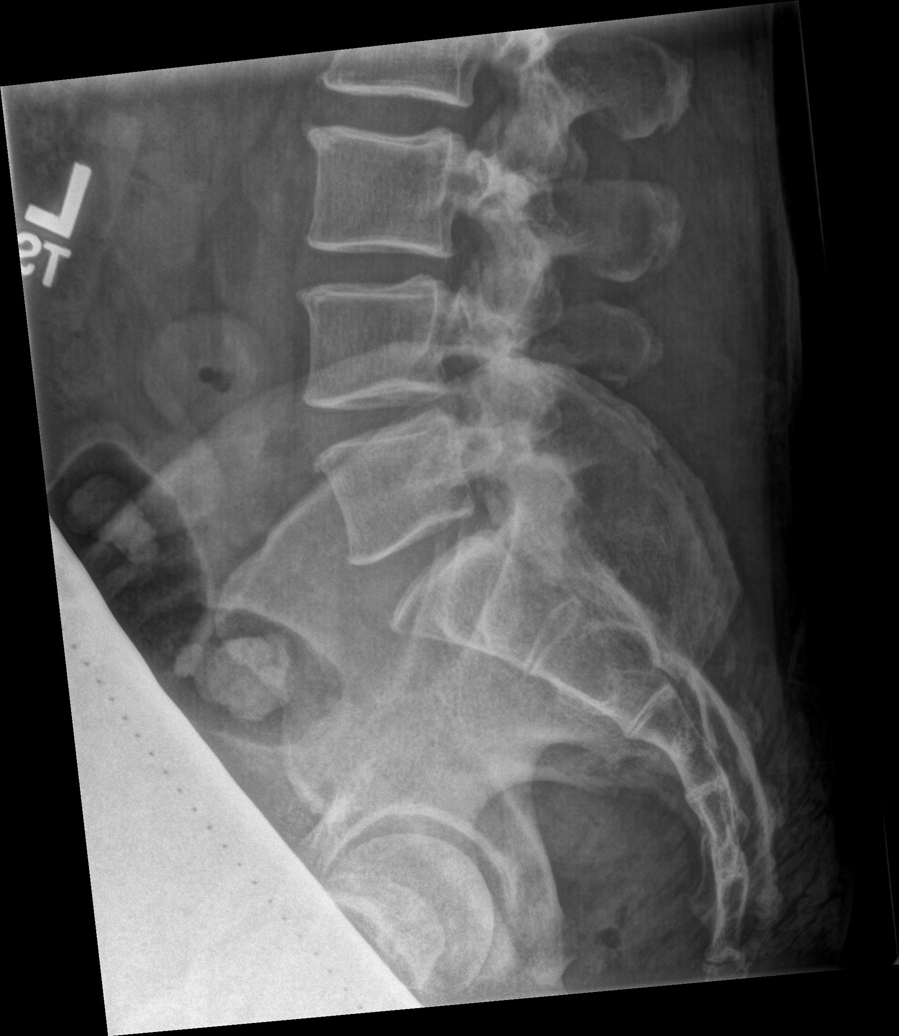

[5 of 5 positions shown; findings below may reference images not displayed]

FINDINGS: No fracture or spondylolisthesis is noted. Disc spaces are
well-maintained. Anterior osteophyte formation is noted at L1-2 and
L2-3. Posterior facet joints are unremarkable.
IMPRESSION: Mild degenerative changes as described above. No acute abnormality
seen in the lumbar spine.

## 2016-08-22 MED ORDER — PREDNISONE 10 MG PO TABS: 10.0000 mg | ORAL_TABLET | Freq: Every day | ORAL | 0 refills | Status: DC

## 2016-08-22 NOTE — Progress Notes (Signed)
Jeffrey [name]  MRN: [id number] DOB: 1956/05/28  Subjective:  HPI   The patient is a 61 year old male who presents for follow up after ER visit on 08/19/16.  The patient was seen with right sided pain from the hip down his leg.  His differential at the time of the ER visit was pyelonephritis vs appendicitis vs sciatica.  The patient had CT- renal stone study and CT angio abdomen/Pelvis.  He also had labwork that included UA, urine culture, CBC and a Met b.  His Met B was abnormal and his UA revealed glucose greater than 500, however the patient is on Jardiance.  He was given Naproxen for discomfort.  He states that walking and standing make the pain worse.  The patient reports today he is improving. His pain starts at the right hip and goes down his leg with burning in the anterior thigh which he states is the worst of the pain.  He is taking Naproxen and Tramadol for the pain and this appears to be controlling the pain.  Patient Active Problem List   Diagnosis Date Noted  . Sinusitis 09/08/2015  . Hypertriglyceridemia 07/19/2015  . Pancreatitis 07/16/2015  . ED (erectile dysfunction) of organic origin 04/18/2015  . Hypertension 12/06/2014  . Seasonal allergies 12/06/2014  . Cholecystitis 12/06/2014  . Diverticulosis 12/06/2014  . GERD (gastroesophageal reflux disease) 12/06/2014  . Diabetes mellitus ([place]) 12/06/2014  . Plantar fasciitis 12/06/2014  . Actinic keratosis 12/06/2014  . Peyronie's disease 12/06/2014  . Erectile dysfunction 12/06/2014  . Obesity 12/06/2014  . Hyperlipidemia 12/06/2014  . Myalgia 12/06/2014    Past Medical History:  Diagnosis Date  . Actinic keratosis   . Cholecystitis   . Diabetes mellitus ([place])   . Diverticulosis   . Erectile dysfunction   . GERD (gastroesophageal reflux disease)   . Hyperlipidemia   . Hypertension   . Myalgia   . Obesity   . Peyronie's disease   . Plantar fasciitis   . Seasonal allergies     Social History    Social History  . Marital status: Married    Spouse name: N/A  . Number of children: N/A  . Years of education: N/A   Occupational History  . Not on file.   Social History Main Topics  . Smoking status: Former Smoker    Packs/day: 1.50    Types: Cigarettes    Quit date: 08/11/1984  . Smokeless tobacco: Never Used  . Alcohol use No  . Drug use: No  . Sexual activity: Not on file   Other Topics Concern  . Not on file   Social History Narrative  . No narrative on file    Outpatient Encounter Prescriptions as of 08/22/2016  Medication Sig  . albuterol (PROVENTIL HFA;VENTOLIN HFA) 108 (90 Base) MCG/ACT inhaler Inhale 2 puffs into the lungs every 4 (four) hours as needed for wheezing or shortness of breath.  Marland Kitchen amLODipine (NORVASC) 10 MG tablet Take 1 tablet by mouth  daily  . aspirin 81 MG tablet Take 81 mg by mouth daily.  . empagliflozin (JARDIANCE) 10 MG TABS tablet Take 10 mg by mouth daily.  . fenofibrate 160 MG tablet Take 1 tablet by mouth  daily  . fluticasone (FLONASE) 50 MCG/ACT nasal spray Place 1 spray into both nostrils daily.  Marland Kitchen glipiZIDE (GLUCOTROL) 10 MG tablet Take 0.5 tablets (5 mg total) by mouth daily before breakfast.  . glucose blood (ONETOUCH VERIO) test strip To check blood sugar twice  a day. DX E11.9  . loratadine (CLARITIN) 10 MG tablet Take 1 tablet (10 mg total) by mouth daily.  Marland Kitchen losartan (COZAAR) 100 MG tablet Take 1 tablet by mouth  daily  . metFORMIN (GLUCOPHAGE) 1000 MG tablet Take 1 tablet (1,000 mg total) by mouth 2 (two) times daily with a meal.  . naproxen (NAPROSYN) 500 MG tablet Take 1 tablet (500 mg total) by mouth 2 (two) times daily with a meal.  . omega-3 acid ethyl esters (LOVAZA) 1 g capsule Take 1 capsule (1 g total) by mouth 2 (two) times daily.  Marland Kitchen omeprazole (PRILOSEC) 20 MG capsule TAKE 1 CAPSULE BY MOUTH  DAILY (Patient taking differently: TAKE 1 CAPSULE BY TWICE DAILY)  . ONETOUCH DELICA LANCETS 16R MISC Check sugar once daily  DX E11.9  . pravastatin (PRAVACHOL) 40 MG tablet TAKE 1 TABLET BY MOUTH  DAILY AT 6 PM.  . sildenafil (VIAGRA) 100 MG tablet Take 100 mg by mouth daily as needed for erectile dysfunction.  . traMADol (ULTRAM) 50 MG tablet Take by mouth every 6 (six) hours as needed.  . [DISCONTINUED] oseltamivir (TAMIFLU) 75 MG capsule Take 1 capsule (75 mg total) by mouth 2 (two) times daily. (Patient not taking: Reported on 08/19/2016)   No facility-administered encounter medications on file as of 08/22/2016.     Allergies  Allergen Reactions  . Codeine Hives    Review of Systems  Constitutional: Positive for fever. Negative for chills, diaphoresis, malaise/fatigue and weight loss.  Respiratory: Negative for cough, shortness of breath and wheezing.   Cardiovascular: Negative for chest pain, palpitations, orthopnea, claudication, leg swelling and PND.  Gastrointestinal: Negative for abdominal pain, blood in stool, constipation, diarrhea, heartburn, melena, nausea and vomiting.  Genitourinary: Negative for dysuria, flank pain, frequency, hematuria and urgency.  Musculoskeletal: Positive for back pain, joint pain and myalgias.  Neurological: Positive for dizziness (not new symptom). Negative for weakness and headaches.    Objective:  BP 130/64 (BP Location: Right Arm, Patient Position: Sitting, Cuff Size: Normal)   Pulse 74   Temp 97.7 F (36.5 C) (Oral)   Resp 16   Physical Exam  Constitutional: He is well-developed, well-nourished, and in no distress.  HENT:  Head: Normocephalic and atraumatic.  Eyes: Conjunctivae are normal. Pupils are equal, round, and reactive to light.  Neck: Normal range of motion.  Cardiovascular: Normal rate, regular rhythm and normal heart sounds.   Pulmonary/Chest: Effort normal and breath sounds normal.  Musculoskeletal:  Tenderness over the greater trocanter    Assessment and Plan :  1. Pain of right hip joint Likely sciatica at L2-L3 but this could be a  trochanteric bursitis . Treat conservatively and return to clinic 3 weeks. - DG Lumbar Spine Complete; Future - DG HIP UNILAT WITH PELVIS 2-3 VIEWS RIGHT; Future - predniSONE (DELTASONE) 10 MG tablet; Take 1 tablet (10 mg total) by mouth daily with breakfast. 6 po day 1, 5 po day 2, 4 po day 3, 3 po day 4, 2 po day 5, 1 po day 6.  Dispense: 21 tablet; Refill: 0 2. Obesity 3. Type 2 diabetes Patient advised to watch sugar while he is on the prednisone for the next 6 days.  HPI, Exam and A&P Transcribed under the direction and in the presence of Miguel Aschoff, Brooke Bonito., MD. Electronically Signed: Althea Charon, RMA I have done the exam and reviewed the chart and it is accurate to the best of my knowledge. Development worker, community has been used and  any errors in dictation or transcription are unintentional. Miguel Aschoff M.D. [facility]

## 2016-08-22 NOTE — Telephone Encounter (Signed)
Left message to call back  

## 2016-08-22 NOTE — Patient Instructions (Signed)
Hold Naproxen while on Steroids.  Steroids will cause glucose to increase.  Check glucose daily and call the office is it gets 400

## 2016-08-22 NOTE — Telephone Encounter (Signed)
-----   Message from Jerrol Banana., MD sent at 08/22/2016  9:36 AM EST ----- Hip normal x-ray. Mild degenerative disc disease of back

## 2016-08-22 NOTE — Telephone Encounter (Signed)
Advised patient of results.  

## 2016-09-03 ENCOUNTER — Other Ambulatory Visit: Payer: Self-pay | Admitting: Family Medicine

## 2016-09-03 NOTE — Telephone Encounter (Signed)
Ok,5rf

## 2016-09-03 NOTE — Telephone Encounter (Signed)
Pt contacted office for refill request on the following medications:  traMADol (ULTRAM) 50 MG tablet.  [facility].  [id number]

## 2016-09-03 NOTE — Telephone Encounter (Signed)
Pt informed and voiced understanding of results. Med called in 

## 2016-09-03 NOTE — Telephone Encounter (Signed)
Please review-aa 

## 2016-09-12 ENCOUNTER — Telehealth: Payer: Self-pay | Admitting: Family Medicine

## 2016-09-12 ENCOUNTER — Ambulatory Visit (INDEPENDENT_AMBULATORY_CARE_PROVIDER_SITE_OTHER): Payer: 59 | Admitting: Family Medicine

## 2016-09-12 ENCOUNTER — Encounter: Payer: Self-pay | Admitting: Family Medicine

## 2016-09-12 VITALS — BP 116/72 | HR 92 | Temp 97.7°F | Resp 16 | Wt 207.0 lb

## 2016-09-12 DIAGNOSIS — E119 Type 2 diabetes mellitus without complications: Secondary | ICD-10-CM

## 2016-09-12 DIAGNOSIS — M5430 Sciatica, unspecified side: Secondary | ICD-10-CM | POA: Diagnosis not present

## 2016-09-12 LAB — POCT GLYCOSYLATED HEMOGLOBIN (HGB A1C): Hemoglobin A1C: 8.5

## 2016-09-12 NOTE — Progress Notes (Signed)
Subjective:  HPI  Back pain- Pt was here on 08/22/16 for sciatica but could be trochanteric bursitis started prednisone and ordered X-rays. Showed mild DDD of back. Pt reports that this is better.    Diabetes Mellitus Type II, Follow-up:   Lab Results  Component Value Date   HGBA1C 9.5 (H) 06/14/2016   HGBA1C 8.5 03/12/2016   HGBA1C 8.0 (H) 10/24/2015    Last seen for diabetes 3 months ago.  Management since then includes none, but noted that if numbers were not better would add jardiance. He reports fair compliance with treatment. He is not having side effects.  Home blood sugar records: 200's  Episodes of hypoglycemia? no   Current Insulin Regimen: n/a Current diet: diabetic Current exercise: none since the sciatica  Pertinent Labs:    Component Value Date/Time   CHOL 137 10/24/2015 0815   TRIG 515 (H) 10/24/2015 0815   HDL 20 (L) 10/24/2015 0815   LDLCALC Comment 10/24/2015 0815   CREATININE 1.44 (H) 08/19/2016 0255    Wt Readings from Last 3 Encounters:  09/12/16 207 lb (93.9 kg)  08/19/16 215 lb (97.5 kg)  08/13/16 215 lb (97.5 kg)    ------------------------------------------------------------------------ Per pt according to his scales he has lost 13 pounds with diet changes.    Prior to Admission medications   Medication Sig Start Date End Date Taking? Authorizing Provider  albuterol (PROVENTIL HFA;VENTOLIN HFA) 108 (90 Base) MCG/ACT inhaler Inhale 2 puffs into the lungs every 4 (four) hours as needed for wheezing or shortness of breath.    Historical Provider, MD  amLODipine (NORVASC) 10 MG tablet Take 1 tablet by mouth  daily 01/26/16   Jerrol Banana., MD  aspirin 81 MG tablet Take 81 mg by mouth daily.    Historical Provider, MD  empagliflozin (JARDIANCE) 10 MG TABS tablet Take 10 mg by mouth daily. 08/20/16   Iracema Lanagan Maceo Pro., MD  fenofibrate 160 MG tablet Take 1 tablet by mouth  daily 12/29/15   Jerrol Banana., MD  fluticasone  [facility]) 50 MCG/ACT nasal spray Place 1 spray into both nostrils daily. 05/04/15   Clarie Camey Maceo Pro., MD  glipiZIDE (GLUCOTROL) 10 MG tablet Take 0.5 tablets (5 mg total) by mouth daily before breakfast. 09/15/15   Jerrol Banana., MD  glucose blood [facility] VERIO) test strip To check blood sugar twice a day. DX E11.9 04/01/16   Jerrol Banana., MD  loratadine (CLARITIN) 10 MG tablet Take 1 tablet (10 mg total) by mouth daily. 04/25/16   Perline Awe Maceo Pro., MD  losartan (COZAAR) 100 MG tablet Take 1 tablet by mouth  daily 01/26/16   Jerrol Banana., MD  metFORMIN (GLUCOPHAGE) 1000 MG tablet Take 1 tablet (1,000 mg total) by mouth 2 (two) times daily with a meal. 09/15/15   Jerrol Banana., MD  naproxen (NAPROSYN) 500 MG tablet Take 1 tablet (500 mg total) by mouth 2 (two) times daily with a meal. 08/19/16   Carrie Mew, MD  omeprazole (PRILOSEC) 20 MG capsule TAKE 1 CAPSULE BY MOUTH  DAILY Patient taking differently: TAKE 1 CAPSULE BY TWICE DAILY 08/15/16   Jerrol Banana., MD  [facility] DELICA LANCETS [id number] MISC Check sugar once daily DX E11.9 04/25/16   Joplin Canty Maceo Pro., MD  pravastatin (PRAVACHOL) 40 MG tablet TAKE 1 TABLET BY MOUTH  DAILY AT 6 PM. 07/31/16   Jerrol Banana., MD  sildenafil (  VIAGRA) 100 MG tablet Take 100 mg by mouth daily as needed for erectile dysfunction.    Historical Provider, MD  traMADol (ULTRAM) 50 MG tablet Take by mouth every 6 (six) hours as needed.    Historical Provider, MD    Patient Active Problem List   Diagnosis Date Noted  . Sinusitis 09/08/2015  . Hypertriglyceridemia 07/19/2015  . Pancreatitis 07/16/2015  . ED (erectile dysfunction) of organic origin 04/18/2015  . Hypertension 12/06/2014  . Seasonal allergies 12/06/2014  . Cholecystitis 12/06/2014  . Diverticulosis 12/06/2014  . GERD (gastroesophageal reflux disease) 12/06/2014  . Diabetes mellitus ([place]) 12/06/2014  . Plantar fasciitis 12/06/2014  . Actinic  keratosis 12/06/2014  . Peyronie's disease 12/06/2014  . Erectile dysfunction 12/06/2014  . Obesity 12/06/2014  . Hyperlipidemia 12/06/2014  . Myalgia 12/06/2014    Past Medical History:  Diagnosis Date  . Actinic keratosis   . Cholecystitis   . Diabetes mellitus ([place])   . Diverticulosis   . Erectile dysfunction   . GERD (gastroesophageal reflux disease)   . Hyperlipidemia   . Hypertension   . Myalgia   . Obesity   . Peyronie's disease   . Plantar fasciitis   . Seasonal allergies     Social History   Social History  . Marital status: Married    Spouse name: N/A  . Number of children: N/A  . Years of education: N/A   Occupational History  . Not on file.   Social History Main Topics  . Smoking status: Former Smoker    Packs/day: 1.50    Types: Cigarettes    Quit date: 08/11/1984  . Smokeless tobacco: Never Used  . Alcohol use No  . Drug use: No  . Sexual activity: Not on file   Other Topics Concern  . Not on file   Social History Narrative  . No narrative on file    Allergies  Allergen Reactions  . Codeine Hives    Review of Systems  Constitutional: Negative.   HENT: Negative.   Eyes: Negative.   Respiratory: Negative.   Cardiovascular: Negative.   Gastrointestinal: Negative.   Genitourinary: Negative.   Musculoskeletal: Positive for back pain.  Skin: Positive for itching (on top of right leg, where he recently had sciaticia ).  Endo/Heme/Allergies: Negative.     Immunization History  Administered Date(s) Administered  . Influenza,inj,Quad PF,36+ Mos 04/20/2015  . Pneumococcal Polysaccharide-23 06/28/2010  . Tdap 10/30/2007  . Zoster 03/12/2016    Objective:  BP 116/72 (BP Location: Left Arm, Patient Position: Sitting, Cuff Size: Normal)   Pulse 92   Temp 97.7 F (36.5 C) (Oral)   Resp 16   Wt 207 lb (93.9 kg)   BMI 32.42 kg/m   Physical Exam  Constitutional: He is oriented to person, place, and time and well-developed,  well-nourished, and in no distress.  Eyes: Conjunctivae and EOM are normal. Pupils are equal, round, and reactive to light.  Neck: Normal range of motion. Neck supple.  Cardiovascular: Normal rate, regular rhythm, normal heart sounds and intact distal pulses.   Pulmonary/Chest: Effort normal and breath sounds normal.  Musculoskeletal: Normal range of motion.  Neurological: He is alert and oriented to person, place, and time. He has normal reflexes. Gait normal. GCS score is 15.  Skin: Skin is warm and dry.  Psychiatric: Mood, memory, affect and judgment normal.    Lab Results  Component Value Date   WBC 8.2 08/19/2016   HGB 15.5 08/19/2016   HCT 45.4 08/19/2016  PLT 260 08/19/2016   GLUCOSE 191 (H) 08/19/2016   CHOL 137 10/24/2015   TRIG 515 (H) 10/24/2015   HDL 20 (L) 10/24/2015   LDLCALC Comment 10/24/2015   TSH 3.080 10/24/2015   PSA 1.1 08/22/2014   HGBA1C 9.5 (H) 06/14/2016   MICROALBUR 50 10/23/2015    CMP     Component Value Date/Time   NA 135 08/19/2016 0255   NA 136 10/24/2015 0815   K 4.2 08/19/2016 0255   CL 100 (L) 08/19/2016 0255   CO2 26 08/19/2016 0255   GLUCOSE 191 (H) 08/19/2016 0255   BUN 31 (H) 08/19/2016 0255   BUN 20 10/24/2015 0815   CREATININE 1.44 (H) 08/19/2016 0255   CALCIUM 9.2 08/19/2016 0255   PROT 7.1 10/24/2015 0815   ALBUMIN 4.4 10/24/2015 0815   AST 17 10/24/2015 0815   ALT 27 10/24/2015 0815   ALKPHOS 43 10/24/2015 0815   BILITOT 0.3 10/24/2015 0815   GFRNONAA 51 (L) 08/19/2016 0255   GFRAA 59 (L) 08/19/2016 0255    Assessment and Plan :  1. Type 2 diabetes mellitus without complication, unspecified long term insulin use status (HCC)  - POCT HgB A1C- 8.5 improved. Pt has lost 13 pounds according to his scales at home. Will go to lifestyle center and continue diet and exercise as tolerated.   2. Sciatica, unspecified laterality Improved with prednisone taper.  3.TIIDM 4.Obesity HPI, Exam, and A&P Transcribed under the  direction and in the presence of Eliyanna Ault L. Cranford Mon, MD  Electronically Signed: Katina Dung, CMA I have done the exam and reviewed the above chart and it is accurate to the best of my knowledge. Development worker, community has been used in this note in any air is in the dictation or transcription are unintentional.  [facility] 09/12/2016 3:24 PM

## 2016-09-12 NOTE — Telephone Encounter (Signed)
Pt called to get an update on when his FMLA papers might be ready for pick up. Pt stated he dropped them off either 09/05/16 or 09/06/16. Please advise. Thanks TNP

## 2016-09-13 ENCOUNTER — Telehealth: Payer: Self-pay

## 2016-09-13 NOTE — Telephone Encounter (Signed)
Please review-aa 

## 2016-09-13 NOTE — Telephone Encounter (Signed)
See other message I am guessing about the disability forms dropped off a few days ago-aa

## 2016-09-13 NOTE — Telephone Encounter (Signed)
Patient called office today asking for update on his [employer] forms that he dropped off a week ago. I didn't see any forms up front, patient was advised that Dr. Rosanna Randy is out of the office today. KW

## 2016-09-16 NOTE — Telephone Encounter (Signed)
Patient called about this again-aa

## 2016-09-16 NOTE — Telephone Encounter (Signed)
Jeffrey [name] can you call patient about this please ?-aa

## 2016-09-16 NOTE — Telephone Encounter (Signed)
Patient advised.cbe °

## 2016-09-16 NOTE — Telephone Encounter (Signed)
Arbie Cookey has it up front.

## 2016-09-20 ENCOUNTER — Encounter: Payer: Self-pay | Admitting: *Deleted

## 2016-09-20 ENCOUNTER — Encounter: Payer: 59 | Attending: Family Medicine | Admitting: *Deleted

## 2016-09-20 VITALS — BP 132/72 | Ht 68.0 in | Wt 206.8 lb

## 2016-09-20 DIAGNOSIS — E1165 Type 2 diabetes mellitus with hyperglycemia: Secondary | ICD-10-CM

## 2016-09-20 DIAGNOSIS — Z713 Dietary counseling and surveillance: Secondary | ICD-10-CM | POA: Diagnosis present

## 2016-09-20 DIAGNOSIS — E119 Type 2 diabetes mellitus without complications: Secondary | ICD-10-CM | POA: Diagnosis not present

## 2016-09-20 NOTE — Progress Notes (Signed)
Diabetes Self-Management Education  Visit Type: First/Initial  Appt. Start Time: 1435 Appt. End Time: [id number]  09/20/2016  [name], identified by name and date of birth, is a 61 y.o. male with a diagnosis of Diabetes: Type 2.   ASSESSMENT  Blood pressure 132/72, height 5\' 8"  (1.727 m), weight 206 lb 12.8 oz (93.8 kg). Body mass index is 31.44 kg/m.      Diabetes Self-Management Education - 09/20/16 1555      Visit Information   Visit Type First/Initial     Initial Visit   Diabetes Type Type 2   Are you currently following a meal plan? Yes   What type of meal plan do you follow? "try not to eat bread, white stuff"   Are you taking your medications as prescribed? Yes  Pt reports he was on Jardiance but didn't improve blood sugars so he stopped it and told his PCP office.    Date Diagnosed 10 years ago     Health Coping   How would you rate your overall health? Fair     Psychosocial Assessment   Patient Belief/Attitude about Diabetes Other (comment)  "discouraged at times"   Self-care barriers None   Self-management support Doctor's office;Family   Patient Concerns Nutrition/Meal planning;Glycemic Control;Medication;Weight Control;Healthy Lifestyle   Special Needs None   Preferred Learning Style Auditory;Visual;Hands on   [place] in progress   How often do you need to have someone help you when you read instructions, pamphlets, or other written materials from your doctor or pharmacy? 1 - Never   What is the last grade level you completed in school? BA     Pre-Education Assessment   Patient understands the diabetes disease and treatment process. Needs Review   Patient understands incorporating nutritional management into lifestyle. Needs Review   Patient undertands incorporating physical activity into lifestyle. Needs Review   Patient understands using medications safely. Needs Review   Patient understands monitoring blood glucose, interpreting  and using results Needs Review   Patient understands prevention, detection, and treatment of acute complications. Needs Instruction   Patient understands prevention, detection, and treatment of chronic complications. Needs Review   Patient understands how to develop strategies to address psychosocial issues. Needs Instruction   Patient understands how to develop strategies to promote health/change behavior. Needs Review     Complications   Last HgB A1C per patient/outside source 8.5 %  09/12/16   How often do you check your blood sugar? 1-2 times/day   Fasting Blood glucose range (mg/dL) >200   Have you had a dilated eye exam in the past 12 months? Yes   Have you had a dental exam in the past 12 months? Yes   Are you checking your feet? Yes   How many days per week are you checking your feet? 4     Dietary Intake   Breakfast egg white, wheat bread and sausage   Lunch chicken salad, apples   Dinner steak, chicken and veggie fahita; pork, fish with peas, green beans, corn, cabbage, squash   Snack (evening) [place]   Beverage(s) water , coffee, unsweetened tea, diet sodas     Exercise   Exercise Type ADL's     Patient Education   Previous Diabetes Education Yes (please comment)  over the phone - health coach with [facility]   Disease state  Explored patient's options for treatment of their diabetes   Nutrition management  Role of diet in the treatment of diabetes and the  relationship between the three main macronutrients and blood glucose level;Food label reading, portion sizes and measuring food.   Physical activity and exercise  Role of exercise on diabetes management, blood pressure control and cardiac health.   Medications Reviewed patients medication for diabetes, action, purpose, timing of dose and side effects.   Monitoring Purpose and frequency of SMBG.;Taught/discussed recording of test results and interpretation of SMBG.;Identified appropriate SMBG and/or A1C goals.   Chronic  complications Relationship between chronic complications and blood glucose control   Psychosocial adjustment Identified and addressed patients feelings and concerns about diabetes     Individualized Goals (developed by patient)   Reducing Risk Improve blood sugars Decrease medications Lose weight Lead a healthier lifestyle Become more fit     Outcomes   Expected Outcomes Demonstrated interest in learning. Expect positive outcomes   Future DMSE 4-6 wks      Individualized Plan for Diabetes Self-Management Training:   Learning Objective:  Patient will have a greater understanding of diabetes self-management. Patient education plan is to attend individual and/or group sessions per assessed needs and concerns.   Plan:   Patient Instructions  Check blood sugars 1 x day before breakfast or 2 hrs after supper every day Bring blood sugar records to the next class Exercise: Begin walking  for  15 minutes   3 days a week as tolerated and gradually increase to 150 minutes a week  Eat 3 meals day,   1-2 snacks a day Space meals 4-6 hours apart Drink plenty of water   Expected Outcomes:  Demonstrated interest in learning. Expect positive outcomes  Education material provided:  General Meal Planning Guidelines Simple Meal Plan  If problems or questions, patient to contact team via:  Johny Drilling, RN, [place], CDE [phone]  Future DSME appointment: 4-6 wks  Monday October 14, 2016 for Diabetes Class 1

## 2016-09-20 NOTE — Patient Instructions (Signed)
Check blood sugars 1 x day before breakfast or 2 hrs after supper every day Bring blood sugar records to the next class  Exercise: Begin walking  for  15 minutes   3 days a week as tolerated and gradually increase to 150 minutes a week   Eat 3 meals day,   1-2 snacks a day Space meals 4-6 hours apart Drink plenty of water  Return for classes on:

## 2016-09-24 ENCOUNTER — Other Ambulatory Visit: Payer: Self-pay | Admitting: Family Medicine

## 2016-09-24 DIAGNOSIS — E1169 Type 2 diabetes mellitus with other specified complication: Secondary | ICD-10-CM

## 2016-09-25 ENCOUNTER — Other Ambulatory Visit: Payer: Self-pay | Admitting: Family Medicine

## 2016-09-25 NOTE — Telephone Encounter (Signed)
Pt called needing PA 's on his   loratadine (CLARITIN) 10 MG tablet  Omega 3 1 g bid  He uses [facility]  Thanks [employer]

## 2016-09-26 MED ORDER — LORATADINE 10 MG PO TABS: 10.0000 mg | ORAL_TABLET | Freq: Every day | ORAL | 3 refills | Status: DC

## 2016-09-26 MED ORDER — OMEGA-3-ACID ETHYL ESTERS 1 G PO CAPS: 1.0000 g | ORAL_CAPSULE | Freq: Two times a day (BID) | ORAL | 3 refills | Status: DC

## 2016-09-26 NOTE — Telephone Encounter (Signed)
Refill sent in-aa 

## 2016-09-27 ENCOUNTER — Other Ambulatory Visit: Payer: Self-pay

## 2016-10-14 ENCOUNTER — Encounter: Payer: 59 | Attending: Family Medicine | Admitting: Dietician

## 2016-10-14 ENCOUNTER — Encounter: Payer: Self-pay | Admitting: Dietician

## 2016-10-14 ENCOUNTER — Other Ambulatory Visit: Payer: Self-pay | Admitting: Family Medicine

## 2016-10-14 VITALS — Ht 68.0 in | Wt 208.1 lb

## 2016-10-14 DIAGNOSIS — E119 Type 2 diabetes mellitus without complications: Secondary | ICD-10-CM | POA: Diagnosis not present

## 2016-10-14 DIAGNOSIS — E1165 Type 2 diabetes mellitus with hyperglycemia: Secondary | ICD-10-CM

## 2016-10-14 DIAGNOSIS — Z713 Dietary counseling and surveillance: Secondary | ICD-10-CM | POA: Insufficient documentation

## 2016-10-14 DIAGNOSIS — E785 Hyperlipidemia, unspecified: Secondary | ICD-10-CM

## 2016-10-14 NOTE — Progress Notes (Signed)

## 2016-10-28 ENCOUNTER — Encounter: Payer: 59 | Admitting: *Deleted

## 2016-10-28 ENCOUNTER — Encounter: Payer: Self-pay | Admitting: *Deleted

## 2016-10-28 VITALS — Wt 203.4 lb

## 2016-10-28 DIAGNOSIS — E1165 Type 2 diabetes mellitus with hyperglycemia: Secondary | ICD-10-CM

## 2016-10-28 DIAGNOSIS — Z713 Dietary counseling and surveillance: Secondary | ICD-10-CM | POA: Diagnosis not present

## 2016-10-28 NOTE — Progress Notes (Signed)

## 2016-10-29 ENCOUNTER — Encounter: Payer: Self-pay | Admitting: *Deleted

## 2016-10-29 NOTE — Progress Notes (Signed)
Fax sent to Dr Rosanna Randy regarding elevated blood sugars. Most blood sugars are 200's mg/dL and some are in the 300's mg/dL.

## 2016-11-04 ENCOUNTER — Encounter: Payer: 59 | Admitting: Dietician

## 2016-11-04 ENCOUNTER — Encounter: Payer: Self-pay | Admitting: Dietician

## 2016-11-04 VITALS — BP 134/78 | Ht 68.0 in | Wt 206.4 lb

## 2016-11-04 DIAGNOSIS — E1165 Type 2 diabetes mellitus with hyperglycemia: Secondary | ICD-10-CM

## 2016-11-04 DIAGNOSIS — Z713 Dietary counseling and surveillance: Secondary | ICD-10-CM | POA: Diagnosis not present

## 2016-11-04 NOTE — Progress Notes (Signed)

## 2016-11-17 ENCOUNTER — Other Ambulatory Visit: Payer: Self-pay | Admitting: Family Medicine

## 2016-11-22 ENCOUNTER — Encounter: Payer: Self-pay | Admitting: *Deleted

## 2016-12-10 ENCOUNTER — Ambulatory Visit (INDEPENDENT_AMBULATORY_CARE_PROVIDER_SITE_OTHER): Payer: 59 | Admitting: Family Medicine

## 2016-12-10 VITALS — BP 138/76 | HR 72 | Temp 97.5°F | Resp 16 | Wt 205.0 lb

## 2016-12-10 DIAGNOSIS — E1169 Type 2 diabetes mellitus with other specified complication: Secondary | ICD-10-CM

## 2016-12-10 LAB — POCT UA - MICROALBUMIN: Microalbumin Ur, POC: 20 mg/L

## 2016-12-10 LAB — POCT GLYCOSYLATED HEMOGLOBIN (HGB A1C): Hemoglobin A1C: 8.7

## 2016-12-10 NOTE — Progress Notes (Signed)
Jeffrey [name]  MRN: [id number] DOB: 05/04/1956  Subjective:  HPI  Patient is here for 3 months follow up. DM: patient checks his sugar usually once daily fasting in the morning and sometimes in the evening. Fasting reading has been 225-300 and evening reading around 180s. No numbness or tingling sensation present. Lab Results  Component Value Date   HGBA1C 8.5 09/12/2016   Last office visit was on 09/12/16. His sciatica pain is better. Lab work: CBC, BMP on 08/19/16, MetC, TSH and Lipid on 10/24/15,  Patient Active Problem List   Diagnosis Date Noted  . Sinusitis 09/08/2015  . Hypertriglyceridemia 07/19/2015  . Pancreatitis 07/16/2015  . ED (erectile dysfunction) of organic origin 04/18/2015  . Hypertension 12/06/2014  . Seasonal allergies 12/06/2014  . Cholecystitis 12/06/2014  . Diverticulosis 12/06/2014  . GERD (gastroesophageal reflux disease) 12/06/2014  . Diabetes mellitus ([place]) 12/06/2014  . Plantar fasciitis 12/06/2014  . Actinic keratosis 12/06/2014  . Peyronie's disease 12/06/2014  . Erectile dysfunction 12/06/2014  . Obesity 12/06/2014  . Hyperlipidemia 12/06/2014  . Myalgia 12/06/2014    Past Medical History:  Diagnosis Date  . Actinic keratosis   . Cholecystitis   . Diabetes mellitus ([place])   . Diverticulosis   . Erectile dysfunction   . GERD (gastroesophageal reflux disease)   . Hyperlipidemia   . Hypertension   . Myalgia   . Obesity   . Peyronie's disease   . Plantar fasciitis   . Seasonal allergies     Social History   Social History  . Marital status: Married    Spouse name: N/A  . Number of children: N/A  . Years of education: N/A   Occupational History  . Not on file.   Social History Main Topics  . Smoking status: Former Smoker    Packs/day: 1.50    Types: Cigarettes    Quit date: 08/11/1984  . Smokeless tobacco: Never Used  . Alcohol use No  . Drug use: No  . Sexual activity: Not on file   Other Topics Concern  . Not  on file   Social History Narrative  . No narrative on file    Outpatient Encounter Prescriptions as of 12/10/2016  Medication Sig  . albuterol (PROVENTIL HFA;VENTOLIN HFA) 108 (90 Base) MCG/ACT inhaler Inhale 2 puffs into the lungs every 4 (four) hours as needed for wheezing or shortness of breath.  Marland Kitchen amLODipine (NORVASC) 10 MG tablet Take 1 tablet by mouth  daily  . aspirin 81 MG tablet Take 81 mg by mouth daily.  . fenofibrate 160 MG tablet TAKE 1 TABLET BY MOUTH  DAILY  . fluticasone (FLONASE) 50 MCG/ACT nasal spray Place 1 spray into both nostrils daily. (Patient taking differently: Place 1 spray into both nostrils daily as needed. )  . glipiZIDE (GLUCOTROL) 10 MG tablet TAKE ONE-HALF TABLET BY  MOUTH DAILY BEFORE  BREAKFAST  . glucose blood (ONETOUCH VERIO) test strip To check blood sugar twice a day. DX E11.9  . loratadine (CLARITIN) 10 MG tablet Take 1 tablet (10 mg total) by mouth daily.  Marland Kitchen losartan (COZAAR) 100 MG tablet Take 1 tablet by mouth  daily  . metFORMIN (GLUCOPHAGE) 1000 MG tablet TAKE 1 TABLET BY MOUTH TWO  TIMES DAILY WITH MEALS  . naproxen (NAPROSYN) 500 MG tablet Take 1 tablet (500 mg total) by mouth 2 (two) times daily with a meal.  . omega-3 acid ethyl esters (LOVAZA) 1 g capsule Take 1 capsule (1 g total) by mouth 2 (  two) times daily.  Marland Kitchen omeprazole (PRILOSEC) 20 MG capsule TAKE 1 CAPSULE BY MOUTH  DAILY (Patient taking differently: TAKE 1 CAPSULE BY TWICE DAILY)  . ONETOUCH DELICA LANCETS 78E MISC Check sugar once daily DX E11.9  . pravastatin (PRAVACHOL) 40 MG tablet TAKE 1 TABLET BY MOUTH DAILY AT 6 PM  . sildenafil (VIAGRA) 100 MG tablet Take 100 mg by mouth daily as needed for erectile dysfunction.  . traMADol (ULTRAM) 50 MG tablet Take by mouth every 6 (six) hours as needed.   No facility-administered encounter medications on file as of 12/10/2016.     Allergies  Allergen Reactions  . Codeine Hives    Review of Systems  Constitutional: Negative.     Respiratory: Negative.   Cardiovascular: Negative.   Gastrointestinal: Negative.   Musculoskeletal: Negative.   Neurological: Negative.     Objective:  BP 138/76   Pulse 72   Temp 97.5 F (36.4 C)   Resp 16   Wt 205 lb (93 kg)   BMI 31.17 kg/m   Physical Exam  Constitutional: He is oriented to person, place, and time and well-developed, well-nourished, and in no distress.  HENT:  Head: Normocephalic and atraumatic.  Eyes: Conjunctivae are normal. No scleral icterus.  Neck: No thyromegaly present.  Cardiovascular: Normal rate, regular rhythm and normal heart sounds.   Pulmonary/Chest: Effort normal and breath sounds normal.  Abdominal: Soft.  Neurological: He is alert and oriented to person, place, and time. Gait normal. GCS score is 15.  Skin: Skin is warm and dry.  Psychiatric: Mood, memory, affect and judgment normal.    Assessment and Plan :  TIIDM A1C is 8.7--start Truliant weekly. RTC 2-3 weeks. Foot exam normal. Diabetic Nephropathy Microalbumin 20 today.  I have done the exam and reviewed the chart and it is accurate to the best of my knowledge. Development worker, community has been used and  any errors in dictation or transcription are unintentional. Miguel Aschoff M.D. [facility]

## 2016-12-16 ENCOUNTER — Other Ambulatory Visit: Payer: Self-pay | Admitting: Family Medicine

## 2017-01-02 ENCOUNTER — Ambulatory Visit (INDEPENDENT_AMBULATORY_CARE_PROVIDER_SITE_OTHER): Payer: 59 | Admitting: Family Medicine

## 2017-01-02 ENCOUNTER — Encounter: Payer: Self-pay | Admitting: Family Medicine

## 2017-01-02 VITALS — BP 120/64 | HR 72 | Temp 97.5°F | Resp 16 | Ht 67.0 in | Wt 209.0 lb

## 2017-01-02 DIAGNOSIS — Z125 Encounter for screening for malignant neoplasm of prostate: Secondary | ICD-10-CM

## 2017-01-02 DIAGNOSIS — E784 Other hyperlipidemia: Secondary | ICD-10-CM

## 2017-01-02 DIAGNOSIS — Z0000 Encounter for general adult medical examination without abnormal findings: Secondary | ICD-10-CM | POA: Diagnosis not present

## 2017-01-02 DIAGNOSIS — I10 Essential (primary) hypertension: Secondary | ICD-10-CM | POA: Diagnosis not present

## 2017-01-02 DIAGNOSIS — E1165 Type 2 diabetes mellitus with hyperglycemia: Secondary | ICD-10-CM | POA: Diagnosis not present

## 2017-01-02 DIAGNOSIS — E7849 Other hyperlipidemia: Secondary | ICD-10-CM

## 2017-01-02 MED ORDER — DULAGLUTIDE 1.5 MG/0.5ML ~~LOC~~ SOAJ: 1.5000 mg | SUBCUTANEOUS | 3 refills | Status: DC

## 2017-01-02 NOTE — Progress Notes (Signed)
Patient: Jeffrey [name], Male    DOB: 10-22-1955, 61 y.o.   MRN: [id number] Visit Date: 01/02/2017  Today's Provider: [name], MD   Chief Complaint  Patient presents with  . Annual Exam  . Diabetes   Subjective:    Annual physical exam Jeffrey [name] is a 61 y.o. male who presents today for health maintenance and complete physical. He feels well. He reports exercising once weekly outside of work. Pt walks. He reports he is sleeping well.  Colonoscopy- 10/03/2015. Repeat in 5 years. Tdap- 10/30/2007     Diabetes Mellitus Type II, Follow-up:   Lab Results  Component Value Date   HGBA1C 8.7 12/10/2016   HGBA1C 8.5 09/12/2016   HGBA1C 9.5 (H) 06/14/2016    Last seen for diabetes 3 weeks ago.  Management since then includes starting Trulicity weekly. Samples were given. Pt is out of samples. He reports good compliance with treatment. He is not having side effects.  Current symptoms include none and have been stable. Home blood sugar records: lowest fasting sugar was 139 since starting Trulicity  Episodes of hypoglycemia? Pt did have one episode where he felt lightheaded and some confusion.  Most Recent Eye Exam: last September. Weight trend: fluctuating a bit Prior visit with dietician: yes - Lifestyle Current diet: in general, a "healthy" diet   Current exercise: is active at work, and will walk about once weekly  Pertinent Labs:    Component Value Date/Time   CHOL 137 10/24/2015 0815   TRIG 515 (H) 10/24/2015 0815   HDL 20 (L) 10/24/2015 0815   LDLCALC Comment 10/24/2015 0815   CREATININE 1.44 (H) 08/19/2016 0255    Wt Readings from Last 3 Encounters:  01/02/17 209 lb (94.8 kg)  12/10/16 205 lb (93 kg)  11/04/16 206 lb 6.4 oz (93.6 kg)        Review of Systems  Constitutional: Negative.   HENT: Negative.   Eyes: Negative.   Respiratory: Negative.   Cardiovascular: Negative.   Gastrointestinal: Negative.   Endocrine:  Negative.   Genitourinary: Negative.   Musculoskeletal: Negative.   Allergic/Immunologic: Negative.   Neurological: Negative.   Hematological: Negative.   Psychiatric/Behavioral: Negative.     Social History      He  reports that he quit smoking about 32 years ago. His smoking use included Cigarettes. He smoked 1.50 packs per day. He has never used smokeless tobacco. He reports that he does not drink alcohol or use drugs.       Social History   Social History  . Marital status: Married    Spouse name: Mateo Flow  . Number of children: 3  . Years of education: bachelors   Occupational History  .  [place] History Main Topics  . Smoking status: Former Smoker    Packs/day: 1.50    Types: Cigarettes    Quit date: 08/11/1984  . Smokeless tobacco: Never Used  . Alcohol use No  . Drug use: No  . Sexual activity: Not Asked   Other Topics Concern  . None   Social History Narrative   Mr. Bagdasarian has 3 biological children, and 2 adopted children.    Past Medical History:  Diagnosis Date  . Actinic keratosis   . Cholecystitis   . Diabetes mellitus ([place])   . Diverticulosis   . Erectile dysfunction   . GERD (gastroesophageal reflux disease)   . Hyperlipidemia   . Hypertension   .  Myalgia   . Obesity   . Peyronie's disease   . Plantar fasciitis   . Seasonal allergies      Patient Active Problem List   Diagnosis Date Noted  . Sinusitis 09/08/2015  . Hypertriglyceridemia 07/19/2015  . Pancreatitis 07/16/2015  . ED (erectile dysfunction) of organic origin 04/18/2015  . Hypertension 12/06/2014  . Seasonal allergies 12/06/2014  . Cholecystitis 12/06/2014  . Diverticulosis 12/06/2014  . GERD (gastroesophageal reflux disease) 12/06/2014  . Diabetes mellitus ([place]) 12/06/2014  . Plantar fasciitis 12/06/2014  . Actinic keratosis 12/06/2014  . Peyronie's disease 12/06/2014  . Erectile dysfunction 12/06/2014  . Obesity 12/06/2014  .  Hyperlipidemia 12/06/2014  . Myalgia 12/06/2014    Past Surgical History:  Procedure Laterality Date  . ARTHROSCOPIC REPAIR ACL    . HERNIA REPAIR     x 2  . LITHOTRIPSY    . MENISCUS REPAIR      Family History        Family Status  Relation Status  . Father Deceased  . Sister Alive  . Brother Alive  . Daughter Alive  . Son Alive  . Brother Alive  . Brother Alive  . Brother Alive  . Sister Alive  . Sister Alive  . Sister Alive  . Son Alive  . MGM (Not Specified)  . MGF Deceased  . PGM (Not Specified)  . PGF Deceased  . Mother Alive        His family history includes ALS in his father; Cancer in his maternal grandmother and paternal grandmother; Club foot in his son; Diabetes in his maternal grandfather, mother, and paternal grandfather; Drug abuse in his sister; Heart attack in his father and paternal grandfather; Heart disease in his brother and brother; Hypertension in his brother, brother, brother, father, and paternal grandfather.     Allergies  Allergen Reactions  . Codeine Hives     Current Outpatient Prescriptions:  .  albuterol (PROVENTIL HFA;VENTOLIN HFA) 108 (90 Base) MCG/ACT inhaler, Inhale 2 puffs into the lungs every 4 (four) hours as needed for wheezing or shortness of breath., Disp: , Rfl:  .  amLODipine (NORVASC) 10 MG tablet, TAKE 1 TABLET BY MOUTH  DAILY, Disp: 90 tablet, Rfl: 3 .  aspirin 81 MG tablet, Take 81 mg by mouth daily., Disp: , Rfl:  .  Dulaglutide (TRULICITY [place]), Inject into the skin., Disp: , Rfl:  .  fenofibrate 160 MG tablet, TAKE 1 TABLET BY MOUTH  DAILY, Disp: 90 tablet, Rfl: 0 .  fluticasone (FLONASE) 50 MCG/ACT nasal spray, Place 1 spray into both nostrils daily. (Patient taking differently: Place 1 spray into both nostrils daily as needed. ), Disp: 16 g, Rfl: 12 .  glipiZIDE (GLUCOTROL) 10 MG tablet, TAKE ONE-HALF TABLET BY  MOUTH DAILY BEFORE  BREAKFAST, Disp: 45 tablet, Rfl: 3 .  glucose blood (ONETOUCH VERIO) test strip, To  check blood sugar twice a day. DX E11.9, Disp: 200 each, Rfl: 4 .  loratadine (CLARITIN) 10 MG tablet, Take 1 tablet (10 mg total) by mouth daily., Disp: 90 tablet, Rfl: 3 .  losartan (COZAAR) 100 MG tablet, TAKE 1 TABLET BY MOUTH  DAILY, Disp: 90 tablet, Rfl: 3 .  metFORMIN (GLUCOPHAGE) 1000 MG tablet, TAKE 1 TABLET BY MOUTH TWO  TIMES DAILY WITH MEALS, Disp: 180 tablet, Rfl: 3 .  naproxen (NAPROSYN) 500 MG tablet, Take 1 tablet (500 mg total) by mouth 2 (two) times daily with a meal., Disp: 20 tablet, Rfl: 0 .  omega-3  acid ethyl esters (LOVAZA) 1 g capsule, Take 1 capsule (1 g total) by mouth 2 (two) times daily., Disp: 180 capsule, Rfl: 3 .  omeprazole (PRILOSEC) 20 MG capsule, TAKE 1 CAPSULE BY MOUTH  DAILY (Patient taking differently: TAKE 1 CAPSULE BY TWICE DAILY), Disp: 90 capsule, Rfl: 3 .  ONETOUCH DELICA LANCETS 73X MISC, Check sugar once daily DX E11.9, Disp: 100 each, Rfl: 3 .  pravastatin (PRAVACHOL) 40 MG tablet, TAKE 1 TABLET BY MOUTH DAILY AT 6 PM, Disp: 30 tablet, Rfl: 11 .  sildenafil (VIAGRA) 100 MG tablet, Take 100 mg by mouth daily as needed for erectile dysfunction., Disp: , Rfl:  .  traMADol (ULTRAM) 50 MG tablet, Take by mouth every 6 (six) hours as needed., Disp: , Rfl:    Patient Care Team: Jerrol Banana., MD as PCP - General (Family Medicine)      Objective:   Vitals: BP 120/64 (BP Location: Left Arm, Patient Position: Sitting, Cuff Size: Large)   Pulse 72   Temp 97.5 F (36.4 C) (Oral)   Resp 16   Ht 5\' 7"  (1.702 m)   Wt 209 lb (94.8 kg)   BMI 32.73 kg/m    Vitals:   01/02/17 1414  BP: 120/64  Pulse: 72  Resp: 16  Temp: 97.5 F (36.4 C)  TempSrc: Oral  Weight: 209 lb (94.8 kg)  Height: 5\' 7"  (1.702 m)     Physical Exam  Constitutional: He is oriented to person, place, and time. He appears well-developed and well-nourished.  HENT:  Head: Normocephalic and atraumatic.  Right Ear: External ear normal.  Left Ear: External ear normal.    Nose: Nose normal.  Mouth/Throat: Oropharynx is clear and moist.  Eyes: Conjunctivae are normal.  Neck: Neck supple. No thyromegaly present.  Cardiovascular: Normal rate, regular rhythm and normal heart sounds.   Pulmonary/Chest: Effort normal and breath sounds normal.  Abdominal: Soft.  Musculoskeletal: Normal range of motion. He exhibits edema.  Trace edema  Lymphadenopathy:    He has no cervical adenopathy.  Neurological: He is alert and oriented to person, place, and time. No cranial nerve deficit. He exhibits normal muscle tone. Coordination normal.  Skin: Skin is warm and dry.  Psychiatric: He has a normal mood and affect. His behavior is normal. Judgment and thought content normal.     Depression Screen PHQ 2/9 Scores 09/20/2016 04/18/2015  PHQ - 2 Score 0 0      Assessment & Plan:     Routine Health Maintenance and Physical Exam  Exercise Activities and Dietary recommendations Goals    None      Immunization History  Administered Date(s) Administered  . Influenza,inj,Quad PF,36+ Mos 04/20/2015  . Pneumococcal Polysaccharide-23 06/28/2010  . Tdap 10/30/2007  . Zoster 03/12/2016    Health Maintenance  Topic Date Due  . FOOT EXAM  08/27/1964  . HIV Screening  08/27/1969  . PNEUMOCOCCAL POLYSACCHARIDE VACCINE (2) 06/29/2015  . INFLUENZA VACCINE  03/12/2017  . OPHTHALMOLOGY EXAM  04/12/2017  . HEMOGLOBIN A1C  06/12/2017  . TETANUS/TDAP  10/29/2017  . COLONOSCOPY  10/02/2020  . Hepatitis C Screening  Completed     Discussed health benefits of physical activity, and encouraged him to engage in regular exercise appropriate for his age and condition.  TIIDM Trulicity 1.5 weekly. Obesity HLD HTN      I have done the exam and reviewed the above chart and it is accurate to the best of my knowledge. Development worker, community  has been used in this note in any air is in the dictation or transcription are unintentional.  [name], MD  [place]

## 2017-01-08 LAB — LIPID PANEL
Chol/HDL Ratio: 5.9 ratio — ABNORMAL HIGH (ref 0.0–5.0)
Cholesterol, Total: 148 mg/dL (ref 100–199)
HDL: 25 mg/dL — ABNORMAL LOW (ref >39)
LDL Calculated: 51 mg/dL (ref 0–99)
Triglycerides: 361 mg/dL — ABNORMAL HIGH (ref 0–149)
VLDL Cholesterol Cal: 72 mg/dL — ABNORMAL HIGH (ref 5–40)

## 2017-01-08 LAB — PSA: Prostate Specific Ag, Serum: 1.2 ng/mL (ref 0.0–4.0)

## 2017-01-08 LAB — TSH: TSH: 2.1 u[IU]/mL (ref 0.450–4.500)

## 2017-02-08 ENCOUNTER — Other Ambulatory Visit: Payer: Self-pay | Admitting: Family Medicine

## 2017-02-10 ENCOUNTER — Other Ambulatory Visit: Payer: Self-pay | Admitting: Family Medicine

## 2017-02-17 ENCOUNTER — Telehealth: Payer: Self-pay | Admitting: Family Medicine

## 2017-02-17 NOTE — Telephone Encounter (Signed)
Pt called wanting to know if we have Verio meter in stock.  He has test strips but his meter went out.  Pt's call back is [phone]  Thanks Con Memos

## 2017-02-18 NOTE — Telephone Encounter (Signed)
Pt advised that we do not have that meter-aa

## 2017-03-10 ENCOUNTER — Other Ambulatory Visit: Payer: Self-pay | Admitting: Family Medicine

## 2017-03-21 ENCOUNTER — Other Ambulatory Visit: Payer: Self-pay | Admitting: Family Medicine

## 2017-04-04 ENCOUNTER — Ambulatory Visit: Payer: 59 | Admitting: Family Medicine

## 2017-05-04 ENCOUNTER — Other Ambulatory Visit: Payer: Self-pay | Admitting: Family Medicine

## 2017-05-04 DIAGNOSIS — E1165 Type 2 diabetes mellitus with hyperglycemia: Secondary | ICD-10-CM

## 2017-05-05 ENCOUNTER — Ambulatory Visit (INDEPENDENT_AMBULATORY_CARE_PROVIDER_SITE_OTHER): Payer: 59 | Admitting: Family Medicine

## 2017-05-05 VITALS — BP 130/60 | HR 84 | Temp 97.4°F | Resp 16 | Wt 212.0 lb

## 2017-05-05 DIAGNOSIS — E1169 Type 2 diabetes mellitus with other specified complication: Secondary | ICD-10-CM

## 2017-05-05 DIAGNOSIS — Z23 Encounter for immunization: Secondary | ICD-10-CM | POA: Diagnosis not present

## 2017-05-05 DIAGNOSIS — E1165 Type 2 diabetes mellitus with hyperglycemia: Secondary | ICD-10-CM

## 2017-05-05 LAB — POCT GLYCOSYLATED HEMOGLOBIN (HGB A1C): Hemoglobin A1C: 7

## 2017-05-05 MED ORDER — METFORMIN HCL 1000 MG PO TABS: 1000.0000 mg | ORAL_TABLET | Freq: Two times a day (BID) | ORAL | 3 refills | Status: DC

## 2017-05-05 NOTE — Progress Notes (Signed)
Jeffrey [name]  MRN: [id number] DOB: Jun 09, 1956  Subjective:  HPI   The patient is a 61 year old male who presents for follow up of his diabetes.  He was last seen on 01/02/17.  His last A1C on 12/10/16 was 8.7.  He was started on Trulicity on that last visit.  His readings at home have been running 130's-210s.  He is due for a diabetic foot exam today.  He denies any symptoms suggestive of hypoglycemia.  Patient Active Problem List   Diagnosis Date Noted  . Sinusitis 09/08/2015  . Hypertriglyceridemia 07/19/2015  . Pancreatitis 07/16/2015  . ED (erectile dysfunction) of organic origin 04/18/2015  . Hypertension 12/06/2014  . Seasonal allergies 12/06/2014  . Cholecystitis 12/06/2014  . Diverticulosis 12/06/2014  . GERD (gastroesophageal reflux disease) 12/06/2014  . Diabetes mellitus ([place]) 12/06/2014  . Plantar fasciitis 12/06/2014  . Actinic keratosis 12/06/2014  . Peyronie's disease 12/06/2014  . Erectile dysfunction 12/06/2014  . Obesity 12/06/2014  . Hyperlipidemia 12/06/2014  . Myalgia 12/06/2014    Past Medical History:  Diagnosis Date  . Actinic keratosis   . Cholecystitis   . Diabetes mellitus ([place])   . Diverticulosis   . Erectile dysfunction   . GERD (gastroesophageal reflux disease)   . Hyperlipidemia   . Hypertension   . Myalgia   . Obesity   . Peyronie's disease   . Plantar fasciitis   . Seasonal allergies     Social History   Social History  . Marital status: Married    Spouse name: Mateo Flow  . Number of children: 3  . Years of education: bachelors   Occupational History  .  [place] History Main Topics  . Smoking status: Former Smoker    Packs/day: 1.50    Types: Cigarettes    Quit date: 08/11/1984  . Smokeless tobacco: Never Used  . Alcohol use No  . Drug use: No  . Sexual activity: Not on file   Other Topics Concern  . Not on file   Social History Narrative   Mr. Comer has 3 biological children,  and 2 adopted children.    Outpatient Encounter Prescriptions as of 05/05/2017  Medication Sig  . albuterol (PROVENTIL HFA;VENTOLIN HFA) 108 (90 Base) MCG/ACT inhaler Inhale 2 puffs into the lungs every 4 (four) hours as needed for wheezing or shortness of breath.  Marland Kitchen amLODipine (NORVASC) 10 MG tablet TAKE 1 TABLET BY MOUTH  DAILY  . aspirin 81 MG tablet Take 81 mg by mouth daily.  . fenofibrate 160 MG tablet TAKE 1 TABLET BY MOUTH  DAILY  . fluticasone (FLONASE) 50 MCG/ACT nasal spray Place 1 spray into both nostrils daily. (Patient taking differently: Place 1 spray into both nostrils daily as needed. )  . glipiZIDE (GLUCOTROL) 10 MG tablet TAKE ONE-HALF TABLET BY  MOUTH DAILY BEFORE  BREAKFAST  . glucose blood (ONETOUCH VERIO) test strip Check sugar twice daily, DX E11.9  . loratadine (CLARITIN) 10 MG tablet Take 1 tablet (10 mg total) by mouth daily.  Marland Kitchen losartan (COZAAR) 100 MG tablet TAKE 1 TABLET BY MOUTH  DAILY  . metFORMIN (GLUCOPHAGE) 1000 MG tablet TAKE 1 TABLET BY MOUTH TWO  TIMES DAILY WITH MEALS  . naproxen (NAPROSYN) 500 MG tablet Take 1 tablet (500 mg total) by mouth 2 (two) times daily with a meal.  . omega-3 acid ethyl esters (LOVAZA) 1 g capsule Take 1 capsule (1 g total) by mouth 2 (two) times daily.  Marland Kitchen  omeprazole (PRILOSEC) 20 MG capsule TAKE 1 CAPSULE BY MOUTH  DAILY (Patient taking differently: TAKE 1 CAPSULE BY TWICE DAILY)  . ONETOUCH DELICA LANCETS 46O MISC CHECK SUGAR ONCE DAILY  . pravastatin (PRAVACHOL) 40 MG tablet TAKE 1 TABLET BY MOUTH DAILY AT 6 PM  . sildenafil (VIAGRA) 100 MG tablet Take 100 mg by mouth daily as needed for erectile dysfunction.  . traMADol (ULTRAM) 50 MG tablet Take by mouth every 6 (six) hours as needed.  . TRULICITY 1.5 EH/2.1YY SOPN INJECT 1.5 MG INTO THE SKIN ONCE A WEEK.  . [DISCONTINUED] loratadine (CLARITIN) 10 MG tablet TAKE 1 TABLET BY MOUTH EVERY DAY   No facility-administered encounter medications on file as of 05/05/2017.      Allergies  Allergen Reactions  . Codeine Hives    Review of Systems  Constitutional: Negative for fever and malaise/fatigue.  HENT: Negative.   Eyes: Negative.   Respiratory: Negative for cough, shortness of breath and wheezing.   Cardiovascular: Negative for chest pain, palpitations, orthopnea, claudication and leg swelling.  Genitourinary: Positive for frequency.  Skin: Negative.   Neurological: Negative for weakness.  Endo/Heme/Allergies: Positive for polydipsia.  Psychiatric/Behavioral: Negative.     Objective:  BP 130/60 (BP Location: Right Arm, Patient Position: Sitting, Cuff Size: Normal)   Pulse 84   Temp (!) 97.4 F (36.3 C) (Oral)   Resp 16   Wt 212 lb (96.2 kg)   BMI 33.20 kg/m   Physical Exam  Constitutional: He is oriented to person, place, and time and well-developed, well-nourished, and in no distress.  HENT:  Head: Normocephalic and atraumatic.  Right Ear: External ear normal.  Left Ear: External ear normal.  Nose: Nose normal.  Eyes: Conjunctivae are normal. No scleral icterus.  Neck: No thyromegaly present.  Cardiovascular: Normal rate, regular rhythm and normal heart sounds.   Pulmonary/Chest: Effort normal and breath sounds normal.  Abdominal: Soft.  Neurological: He is alert and oriented to person, place, and time. Gait normal. GCS score is 15.  Skin: Skin is warm and dry.  Psychiatric: Mood, memory, affect and judgment normal.    Assessment and Plan :   1. Type 2 diabetes mellitus with hyperglycemia, without long-term current use of insulin (HCC)  - POCT glycosylated hemoglobin (Hb A1C)  2. Need for influenza vaccination  - Flu Vaccine QUAD 6+ mos PF IM (Fluarix Quad PF)  3.Obesity 4.HLD 5.HTN  I have done the exam and reviewed the chart and it is accurate to the best of my knowledge. Development worker, community has been used and  any errors in dictation or transcription are unintentional. Miguel Aschoff M.D. [facility]

## 2017-05-07 ENCOUNTER — Telehealth: Payer: Self-pay | Admitting: Family Medicine

## 2017-05-07 DIAGNOSIS — E1169 Type 2 diabetes mellitus with other specified complication: Secondary | ICD-10-CM

## 2017-05-07 MED ORDER — METFORMIN HCL 1000 MG PO TABS: 1000.0000 mg | ORAL_TABLET | Freq: Two times a day (BID) | ORAL | 0 refills | Status: DC

## 2017-05-07 NOTE — Telephone Encounter (Signed)
ok 

## 2017-05-07 NOTE — Telephone Encounter (Signed)
Pt needs either samples of metformin 1000 bid or 5 days called into [facility].  He;s waiting for his mail order rx to come in and is out.  Pt's call back is [phone]  Thanks Con Memos

## 2017-05-07 NOTE — Telephone Encounter (Signed)
Please review. Thanks!  

## 2017-05-07 NOTE — Telephone Encounter (Signed)
RX sent in and patient Jeffrey [name], RMA

## 2017-08-23 ENCOUNTER — Encounter: Payer: Self-pay | Admitting: Family Medicine

## 2017-08-23 ENCOUNTER — Ambulatory Visit (INDEPENDENT_AMBULATORY_CARE_PROVIDER_SITE_OTHER): Payer: 59 | Admitting: Family Medicine

## 2017-08-23 VITALS — BP 144/72 | HR 76 | Temp 98.0°F | Resp 16 | Wt 211.0 lb

## 2017-08-23 DIAGNOSIS — B309 Viral conjunctivitis, unspecified: Secondary | ICD-10-CM | POA: Diagnosis not present

## 2017-08-23 MED ORDER — SULFACETAMIDE SODIUM 10 % OP SOLN: 2.0000 [drp] | Freq: Four times a day (QID) | OPHTHALMIC | 0 refills | Status: DC

## 2017-08-23 NOTE — Progress Notes (Signed)
Subjective:     Patient ID: Jeffrey [name], male   DOB: Oct 02, 1955, 62 y.o.   MRN: [id number] Chief Complaint  Patient presents with  . Conjunctivitis    Patient reports that he has had eye redness in his left eye X 2 days. He has used eye drops with minimal relief. He does have minimal swelling and drainage in his eye.    HPI States he has had watery drainage but no contact use or changes in his vision.  Review of Systems     Objective:   Physical Exam  Constitutional: He appears well-developed and well-nourished. No distress.  HENT:  Left DDU:KGURKY and conjunctiva inflamed with watery drainage. V.A. Intact to # of fingers. Right eye unaffected  Eyes: Pupils are equal, round, and reactive to light.       Assessment:    1. Acute viral conjunctivitis of left eye - sulfacetamide (BLEPH-10) 10 % ophthalmic solution; Place 2 drops into both eyes 4 (four) times daily.  Dispense: 15 mL; Refill: 0    Plan:   Discussed frequent warm compresses. Start eye drops if matting or purulent drainage.

## 2017-08-23 NOTE — Patient Instructions (Signed)
Use frequent warm compresses to right eye. If matting or colored drainage start the eye antibiotic drops.

## 2017-09-04 ENCOUNTER — Encounter: Payer: Self-pay | Admitting: Family Medicine

## 2017-09-04 ENCOUNTER — Ambulatory Visit: Payer: 59 | Admitting: Family Medicine

## 2017-09-04 VITALS — BP 128/76 | HR 84 | Temp 97.6°F | Resp 16 | Wt 211.0 lb

## 2017-09-04 DIAGNOSIS — E1165 Type 2 diabetes mellitus with hyperglycemia: Secondary | ICD-10-CM | POA: Diagnosis not present

## 2017-09-04 LAB — POCT GLYCOSYLATED HEMOGLOBIN (HGB A1C): Hemoglobin A1C: 8

## 2017-09-04 NOTE — Progress Notes (Signed)
Patient: Jeffrey [name] Male    DOB: 1956/03/22   62 y.o.   MRN: [id number] Visit Date: 09/04/2017  Today's Provider: [name], MD   Chief Complaint  Patient presents with  . Diabetes   Subjective:    HPI  Diabetes Mellitus Type II, Follow-up:   Lab Results  Component Value Date   HGBA1C 7.0 05/05/2017   HGBA1C 8.7 12/10/2016   HGBA1C 8.5 09/12/2016    Last seen for diabetes 5 months ago.  Management since then includes none. He reports good compliance with treatment. He is not having side effects.  Home blood sugar records: 150-220  Episodes of hypoglycemia? no   Current Insulin Regimen: Trulicity 1 injection a week. Most Recent Eye Exam: due Current exercise: walking  Pertinent Labs:    Component Value Date/Time   CHOL 148 01/07/2017 1100   TRIG 361 (H) 01/07/2017 1100   HDL 25 (L) 01/07/2017 1100   LDLCALC 51 01/07/2017 1100   CREATININE 1.44 (H) 08/19/2016 0255    Wt Readings from Last 3 Encounters:  09/04/17 211 lb (95.7 kg)  08/23/17 211 lb (95.7 kg)  05/05/17 212 lb (96.2 kg)    ------------------------------------------------------------------------      Allergies  Allergen Reactions  . Codeine Hives     Current Outpatient Medications:  .  albuterol (PROVENTIL HFA;VENTOLIN HFA) 108 (90 Base) MCG/ACT inhaler, Inhale 2 puffs into the lungs every 4 (four) hours as needed for wheezing or shortness of breath., Disp: , Rfl:  .  amLODipine (NORVASC) 10 MG tablet, TAKE 1 TABLET BY MOUTH  DAILY, Disp: 90 tablet, Rfl: 3 .  aspirin 81 MG tablet, Take 81 mg by mouth daily., Disp: , Rfl:  .  fenofibrate 160 MG tablet, TAKE 1 TABLET BY MOUTH  DAILY, Disp: 90 tablet, Rfl: 3 .  fluticasone (FLONASE) 50 MCG/ACT nasal spray, Place 1 spray into both nostrils daily. (Patient taking differently: Place 1 spray into both nostrils daily as needed. ), Disp: 16 g, Rfl: 12 .  glipiZIDE (GLUCOTROL) 10 MG tablet, TAKE ONE-HALF TABLET BY  MOUTH  DAILY BEFORE  BREAKFAST, Disp: 45 tablet, Rfl: 3 .  glucose blood (ONETOUCH VERIO) test strip, Check sugar twice daily, DX E11.9, Disp: 150 each, Rfl: 3 .  loratadine (CLARITIN) 10 MG tablet, Take 1 tablet (10 mg total) by mouth daily., Disp: 90 tablet, Rfl: 3 .  losartan (COZAAR) 100 MG tablet, TAKE 1 TABLET BY MOUTH  DAILY, Disp: 90 tablet, Rfl: 3 .  metFORMIN (GLUCOPHAGE) 1000 MG tablet, Take 1 tablet (1,000 mg total) by mouth 2 (two) times daily with a meal., Disp: 10 tablet, Rfl: 0 .  omega-3 acid ethyl esters (LOVAZA) 1 g capsule, Take 1 capsule (1 g total) by mouth 2 (two) times daily., Disp: 180 capsule, Rfl: 3 .  omeprazole (PRILOSEC) 20 MG capsule, TAKE 1 CAPSULE BY MOUTH  DAILY (Patient taking differently: TAKE 1 CAPSULE BY TWICE DAILY), Disp: 90 capsule, Rfl: 3 .  ONETOUCH DELICA LANCETS 71G MISC, CHECK SUGAR ONCE DAILY, Disp: 100 each, Rfl: 12 .  pravastatin (PRAVACHOL) 40 MG tablet, TAKE 1 TABLET BY MOUTH DAILY AT 6 PM, Disp: 30 tablet, Rfl: 11 .  sildenafil (VIAGRA) 100 MG tablet, Take 100 mg by mouth daily as needed for erectile dysfunction., Disp: , Rfl:  .  TRULICITY 1.5 GY/6.9SW SOPN, INJECT 1.5 MG INTO THE SKIN ONCE A WEEK., Disp: 0.5 pen, Rfl: 11 .  naproxen (NAPROSYN) 500 MG tablet,  Take 1 tablet (500 mg total) by mouth 2 (two) times daily with a meal. (Patient not taking: Reported on 09/04/2017), Disp: 20 tablet, Rfl: 0 .  sulfacetamide (BLEPH-10) 10 % ophthalmic solution, Place 2 drops into both eyes 4 (four) times daily. (Patient not taking: Reported on 09/04/2017), Disp: 15 mL, Rfl: 0 .  traMADol (ULTRAM) 50 MG tablet, Take by mouth every 6 (six) hours as needed., Disp: , Rfl:   Review of Systems  Constitutional: Negative.   HENT: Negative.   Eyes: Negative.   Respiratory: Negative.   Cardiovascular: Negative.   Gastrointestinal: Negative.   Endocrine: Negative.   Genitourinary: Negative.   Musculoskeletal: Negative.   Skin: Negative.   Allergic/Immunologic:  Negative.   Neurological: Negative.   Hematological: Negative.   Psychiatric/Behavioral: Negative.     Social History   Tobacco Use  . Smoking status: Former Smoker    Packs/Jeffrey: 1.50    Types: Cigarettes    Last attempt to quit: 08/11/1984    Years since quitting: 33.0  . Smokeless tobacco: Never Used  Substance Use Topics  . Alcohol use: No    Alcohol/week: 0.0 oz   Objective:   BP 128/76 (BP Location: Right Arm, Patient Position: Sitting, Cuff Size: Normal)   Pulse 84   Temp 97.6 F (36.4 C) (Oral)   Resp 16   Wt 211 lb (95.7 kg)   BMI 33.05 kg/m  Vitals:   09/04/17 1449  BP: 128/76  Pulse: 84  Resp: 16  Temp: 97.6 F (36.4 C)  TempSrc: Oral  Weight: 211 lb (95.7 kg)     Physical Exam  Constitutional: He is oriented to person, place, and time. He appears well-developed and well-nourished.  HENT:  Head: Normocephalic and atraumatic.  Right Ear: External ear normal.  Left Ear: External ear normal.  Nose: Nose normal.  Mouth/Throat: Oropharynx is clear and moist.  Eyes: Conjunctivae are normal. No scleral icterus.  Neck: No thyromegaly present.  Cardiovascular: Normal rate, regular rhythm and normal heart sounds.  Pulmonary/Chest: Effort normal and breath sounds normal.  Abdominal: Soft.  Lymphadenopathy:    He has no cervical adenopathy.  Neurological: He is alert and oriented to person, place, and time.  Foot exam normal.  Skin: Skin is warm and dry.  Psychiatric: He has a normal mood and affect. His behavior is normal. Judgment and thought content normal.        Assessment & Plan:     1. Type 2 diabetes mellitus with hyperglycemia, without long-term current use of insulin (HCC) Goal less than 7.0 - POCT HgB A1C--8.0 2.OA of knees  Stem cell injections last year. 3.Obesity     I have done the exam and reviewed the above chart and it is accurate to the best of my knowledge. Development worker, community has been used in this note in any air is in the  dictation or transcription are unintentional.  [name], MD  [place]

## 2017-09-06 ENCOUNTER — Other Ambulatory Visit: Payer: Self-pay | Admitting: Family Medicine

## 2017-09-06 DIAGNOSIS — E785 Hyperlipidemia, unspecified: Secondary | ICD-10-CM

## 2017-09-06 DIAGNOSIS — E1169 Type 2 diabetes mellitus with other specified complication: Secondary | ICD-10-CM

## 2017-09-22 ENCOUNTER — Other Ambulatory Visit: Payer: Self-pay | Admitting: Family Medicine

## 2017-09-24 ENCOUNTER — Other Ambulatory Visit: Payer: Self-pay | Admitting: Family Medicine

## 2017-09-24 NOTE — Telephone Encounter (Signed)
Pharmacy is requesting refills. Thanks!

## 2017-12-12 ENCOUNTER — Other Ambulatory Visit: Payer: Self-pay | Admitting: Family Medicine

## 2017-12-12 DIAGNOSIS — E1165 Type 2 diabetes mellitus with hyperglycemia: Secondary | ICD-10-CM

## 2017-12-12 NOTE — Telephone Encounter (Signed)
[facility] pharmacy faxed a refill request for the following medication. Thanks CC  TRULICITY 1.5 FE/7.6DY SOPN

## 2017-12-12 NOTE — Telephone Encounter (Signed)
Please review. Thanks!  

## 2017-12-14 MED ORDER — DULAGLUTIDE 1.5 MG/0.5ML ~~LOC~~ SOAJ: 1.5000 mg | SUBCUTANEOUS | 11 refills | Status: DC

## 2018-01-23 ENCOUNTER — Other Ambulatory Visit: Payer: Self-pay | Admitting: Family Medicine

## 2018-02-03 ENCOUNTER — Other Ambulatory Visit: Payer: Self-pay | Admitting: Family Medicine

## 2018-02-11 ENCOUNTER — Other Ambulatory Visit: Payer: Self-pay | Admitting: Family Medicine

## 2018-03-23 ENCOUNTER — Encounter: Payer: Self-pay | Admitting: Family Medicine

## 2018-03-23 ENCOUNTER — Ambulatory Visit: Payer: Self-pay | Admitting: Family Medicine

## 2018-03-23 VITALS — BP 124/76 | HR 73 | Temp 97.9°F | Wt 200.0 lb

## 2018-03-23 DIAGNOSIS — I10 Essential (primary) hypertension: Secondary | ICD-10-CM

## 2018-03-23 DIAGNOSIS — E1165 Type 2 diabetes mellitus with hyperglycemia: Secondary | ICD-10-CM

## 2018-03-23 LAB — POCT GLYCOSYLATED HEMOGLOBIN (HGB A1C): Hemoglobin A1C: 6.5 % — AB (ref 4.0–5.6)

## 2018-03-23 NOTE — Progress Notes (Signed)
Poct      Patient: Jeffrey [name] Male    DOB: 07/14/1956   62 y.o.   MRN: [id number] Visit Date: 03/23/2018  Today's Provider: [name], MD   Chief Complaint  Patient presents with  . Diabetes  . Hypertension  . Hyperlipidemia   Subjective:    Diabetes  He presents for his follow-up diabetic visit. He has type 2 diabetes mellitus. His disease course has been stable. There are no hypoglycemic associated symptoms. Pertinent negatives for hypoglycemia include no headaches. Pertinent negatives for diabetes include no blurred vision, no chest pain, no fatigue, no foot paresthesias, no foot ulcerations, no polydipsia, no polyphagia, no polyuria, no visual change, no weakness and no weight loss. There are no hypoglycemic complications. Symptoms are stable. His weight is stable. There is no change (Pt reports his blood sugars are mid to high 100's ) in his home blood glucose trend. An ACE inhibitor/angiotensin II receptor blocker is being taken. He does not see a podiatrist.Eye exam is current.  Hypertension  This is a chronic problem. The problem is unchanged. The problem is controlled. Pertinent negatives include no anxiety, blurred vision, chest pain, headaches, malaise/fatigue, palpitations, peripheral edema or shortness of breath. There are no associated agents to hypertension.  Hyperlipidemia  This is a chronic problem. The problem is controlled. Pertinent negatives include no chest pain, focal sensory loss, focal weakness, leg pain, myalgias or shortness of breath. Current antihyperlipidemic treatment includes statins. There are no compliance problems.    Lab Results  Component Value Date   HGBA1C 8.0 09/04/2017   Wt Readings from Last 3 Encounters:  03/23/18 200 lb (90.7 kg)  09/04/17 211 lb (95.7 kg)  08/23/17 211 lb (95.7 kg)   Lab Results  Component Value Date   CHOL 148 01/07/2017   CHOL 137 10/24/2015   CHOL 156 08/11/2015   Lab Results  Component Value  Date   HDL 25 (L) 01/07/2017   HDL 20 (L) 10/24/2015   HDL 22 (L) 08/11/2015   Lab Results  Component Value Date   LDLCALC 51 01/07/2017   [place] Comment 10/24/2015   [place] Comment 08/11/2015   Lab Results  Component Value Date   TRIG 361 (H) 01/07/2017   TRIG 515 (H) 10/24/2015   TRIG 445 (H) 08/11/2015   Lab Results  Component Value Date   CHOLHDL 5.9 (H) 01/07/2017   CHOLHDL 20.3 07/19/2015   CHOLHDL 19.2 07/18/2015   No results found for: LDLDIRECT BP Readings from Last 3 Encounters:  03/23/18 124/76  09/04/17 128/76  08/23/17 (!) 144/72        Allergies  Allergen Reactions  . Codeine Hives     Current Outpatient Medications:  .  albuterol (PROVENTIL HFA;VENTOLIN HFA) 108 (90 Base) MCG/ACT inhaler, Inhale 2 puffs into the lungs every 4 (four) hours as needed for wheezing or shortness of breath., Disp: , Rfl:  .  amLODipine (NORVASC) 10 MG tablet, TAKE 1 TABLET BY MOUTH  DAILY, Disp: 90 tablet, Rfl: 3 .  aspirin 81 MG tablet, Take 81 mg by mouth daily., Disp: , Rfl:  .  Dulaglutide (TRULICITY) 1.5 CH/8.5ID SOPN, Inject 1.5 mg into the skin once a week., Disp: 0.5 pen, Rfl: 11 .  fenofibrate 160 MG tablet, TAKE 1 TABLET BY MOUTH  DAILY, Disp: 90 tablet, Rfl: 3 .  fluticasone (FLONASE) 50 MCG/ACT nasal spray, Place 1 spray into both nostrils daily. (Patient taking differently: Place 1 spray into both nostrils daily as needed. ),  Disp: 16 g, Rfl: 12 .  glipiZIDE (GLUCOTROL) 10 MG tablet, TAKE ONE-HALF TABLET BY  MOUTH DAILY BEFORE  BREAKFAST, Disp: 45 tablet, Rfl: 3 .  glucose blood (ONETOUCH VERIO) test strip, Check sugar twice daily, DX E11.9, Disp: 150 each, Rfl: 3 .  loratadine (CLARITIN) 10 MG tablet, Take 1 tablet (10 mg total) by mouth daily., Disp: 90 tablet, Rfl: 3 .  loratadine (CLARITIN) 10 MG tablet, TAKE 1 TABLET BY MOUTH EVERY DAY, Disp: 90 tablet, Rfl: 3 .  losartan (COZAAR) 100 MG tablet, TAKE 1 TABLET BY MOUTH  DAILY, Disp: 90 tablet, Rfl: 3 .   metFORMIN (GLUCOPHAGE) 1000 MG tablet, Take 1 tablet (1,000 mg total) by mouth 2 (two) times daily with a meal., Disp: 10 tablet, Rfl: 0 .  naproxen (NAPROSYN) 500 MG tablet, Take 1 tablet (500 mg total) by mouth 2 (two) times daily with a meal. (Patient not taking: Reported on 09/04/2017), Disp: 20 tablet, Rfl: 0 .  omega-3 acid ethyl esters (LOVAZA) 1 g capsule, TAKE 1 CAPSULE BY MOUTH TWO TIMES DAILY, Disp: 180 capsule, Rfl: 3 .  omeprazole (PRILOSEC) 20 MG capsule, TAKE 1 CAPSULE BY MOUTH  DAILY, Disp: 90 capsule, Rfl: 3 .  ONETOUCH DELICA LANCETS 17E MISC, CHECK SUGAR ONCE DAILY, Disp: 100 each, Rfl: 12 .  pravastatin (PRAVACHOL) 40 MG tablet, TAKE 1 TABLET BY MOUTH  DAILY AT 6 PM., Disp: 90 tablet, Rfl: 3 .  sildenafil (VIAGRA) 100 MG tablet, Take 100 mg by mouth daily as needed for erectile dysfunction., Disp: , Rfl:  .  sulfacetamide (BLEPH-10) 10 % ophthalmic solution, Place 2 drops into both eyes 4 (four) times daily. (Patient not taking: Reported on 09/04/2017), Disp: 15 mL, Rfl: 0 .  traMADol (ULTRAM) 50 MG tablet, Take by mouth every 6 (six) hours as needed., Disp: , Rfl:   Review of Systems  Constitutional: Negative.  Negative for fatigue, malaise/fatigue and weight loss.  HENT: Negative.   Eyes: Negative.  Negative for blurred vision.  Respiratory: Negative.  Negative for shortness of breath.   Cardiovascular: Negative.  Negative for chest pain and palpitations.  Gastrointestinal: Negative.   Endocrine: Negative.  Negative for polydipsia, polyphagia and polyuria.  Musculoskeletal: Negative for myalgias.  Allergic/Immunologic: Negative.   Neurological: Negative for focal weakness, weakness and headaches.  Psychiatric/Behavioral: Negative.     Social History   Tobacco Use  . Smoking status: Former Smoker    Packs/day: 1.50    Types: Cigarettes    Last attempt to quit: 08/11/1984    Years since quitting: 33.6  . Smokeless tobacco: Never Used  Substance Use Topics  .  Alcohol use: No    Alcohol/week: 0.0 standard drinks   Objective:   BP 124/76 (BP Location: Right Arm, Patient Position: Sitting, Cuff Size: Large)   Pulse 73   Temp 97.9 F (36.6 C) (Oral)   Wt 200 lb (90.7 kg)   SpO2 98%   BMI 31.32 kg/m  Vitals:   03/23/18 1607  BP: 124/76  Pulse: 73  Temp: 97.9 F (36.6 C)  TempSrc: Oral  SpO2: 98%  Weight: 200 lb (90.7 kg)     Physical Exam  Constitutional: He is oriented to person, place, and time. He appears well-developed and well-nourished.  HENT:  Head: Normocephalic and atraumatic.  Right Ear: External ear normal.  Left Ear: External ear normal.  Nose: Nose normal.  Eyes: Conjunctivae are normal. No scleral icterus.  Neck: No thyromegaly present.  Cardiovascular: Normal rate, regular  rhythm and normal heart sounds.  Pulmonary/Chest: Effort normal and breath sounds normal.  Abdominal: Soft.  Musculoskeletal: He exhibits no edema.  Neurological: He is alert and oriented to person, place, and time.  Skin: Skin is warm and dry.  Psychiatric: He has a normal mood and affect. His behavior is normal. Judgment and thought content normal.        Assessment & Plan:     1. Essential hypertension   2. Type 2 diabetes mellitus with hyperglycemia, without long-term current use of insulin (HCC) Good control - POCT glycosylated hemoglobin (Hb A1C)--6.5      I have done the exam and reviewed the above chart and it is accurate to the best of my knowledge. Development worker, community has been used in this note in any air is in the dictation or transcription are unintentional.  [name], MD  [place]

## 2018-04-04 ENCOUNTER — Telehealth: Payer: Self-pay | Admitting: Family Medicine

## 2018-04-04 NOTE — Telephone Encounter (Signed)
Pt stated he has changed jobs and therefor his insurance is in process. Pt stated that at this time he can not afford to pay for the medication out of pocket. Pt stated that he took his last injection on Sunday 03/29/18 and is due again tomorrow 04/05/18 but is out of the medication. Pt is asking if we have samples in the office and if so can he come get them. Please advise. Thanks TNP

## 2018-04-06 ENCOUNTER — Other Ambulatory Visit: Payer: Self-pay | Admitting: Family Medicine

## 2018-04-06 NOTE — Telephone Encounter (Signed)
Left message to call back  

## 2018-04-06 NOTE — Telephone Encounter (Signed)
Just use smaller dose for now then--thx

## 2018-04-06 NOTE — Telephone Encounter (Signed)
Please review. Thanks!  

## 2018-04-06 NOTE — Telephone Encounter (Signed)
We only have samples of the smaller dose. Please advise. Thanks!

## 2018-04-06 NOTE — Telephone Encounter (Signed)
Patient needs Losartan and Amlodipine sent to [facility] in [facility].

## 2018-04-07 MED ORDER — LOSARTAN POTASSIUM 100 MG PO TABS: 100.0000 mg | ORAL_TABLET | Freq: Every day | ORAL | 3 refills | Status: DC

## 2018-04-07 MED ORDER — AMLODIPINE BESYLATE 10 MG PO TABS: 10.0000 mg | ORAL_TABLET | Freq: Every day | ORAL | 3 refills | Status: DC

## 2018-04-10 NOTE — Telephone Encounter (Signed)
Samples given.  

## 2018-05-02 ENCOUNTER — Telehealth: Payer: Self-pay | Admitting: Family Medicine

## 2018-05-02 NOTE — Telephone Encounter (Signed)
Pt would like some samples of Trulicity 1.5 mg  His call back is [phone]  Thanks teri

## 2018-05-04 ENCOUNTER — Other Ambulatory Visit: Payer: Self-pay | Admitting: Family Medicine

## 2018-05-04 DIAGNOSIS — E1169 Type 2 diabetes mellitus with other specified complication: Secondary | ICD-10-CM

## 2018-05-04 MED ORDER — GLIPIZIDE 10 MG PO TABS: ORAL_TABLET | ORAL | 3 refills | Status: DC

## 2018-05-04 MED ORDER — GLIPIZIDE 10 MG PO TABS
ORAL_TABLET | ORAL | 3 refills | Status: DC
Start: 2018-05-04 — End: 2018-05-04

## 2018-05-04 NOTE — Telephone Encounter (Signed)
[facility] refill request for the following medication. Thanks CC  glipiZIDE (GLUCOTROL) 10 MG tablet

## 2018-05-04 NOTE — Addendum Note (Signed)
Addended by: Althea Charon D on: 05/04/2018 12:02 PM   Modules accepted: Orders

## 2018-05-05 NOTE — Telephone Encounter (Signed)
Pt calling back checking on Trulicity samples. He is currently out and cannot afford the Rx cost. Please call pt back.  Thanks, [employer]

## 2018-05-05 NOTE — Telephone Encounter (Signed)
Have put a sample pen for the patient

## 2018-05-11 ENCOUNTER — Other Ambulatory Visit: Payer: Self-pay | Admitting: Family Medicine

## 2018-05-11 MED ORDER — PRAVASTATIN SODIUM 80 MG PO TABS: 40.0000 mg | ORAL_TABLET | Freq: Every day | ORAL | 3 refills | Status: DC

## 2018-05-11 NOTE — Telephone Encounter (Signed)
[facility] faxed refill request for the following medications:  pravastatin (PRAVACHOL) 80 MG tablet cut in half and take half tablet once a day  Last Rx: 40 mg 09/06/17 LOV: 03/23/18  *Note from pharmacy: They stated pt doesn't currently have insurance and the medication would be cheaper for the pt to change the mg from 40 mg to 80 mg and cut the tablet in half. They noted that pt is ok with changing the Rx to save money.*  Please advise. Thanks TNP

## 2018-05-14 LAB — HM DIABETES EYE EXAM

## 2018-05-18 ENCOUNTER — Other Ambulatory Visit: Payer: Self-pay | Admitting: Family Medicine

## 2018-05-18 DIAGNOSIS — E1169 Type 2 diabetes mellitus with other specified complication: Secondary | ICD-10-CM

## 2018-05-21 NOTE — Telephone Encounter (Signed)
Pt contacted office for refill request on the following medications:  metFORMIN (GLUCOPHAGE) 1000 MG tablet  [facility] [address]   30 day supply  Pt is requesting Rx for 30 day supply with refills because he doesn't have insurance at this time and is paying out of pocket and can't afford 90 day supplies. Please advise. Thanks TNP

## 2018-06-01 ENCOUNTER — Telehealth: Payer: Self-pay | Admitting: Family Medicine

## 2018-06-01 NOTE — Telephone Encounter (Signed)
Pt wants to know if we have any Trulicity samples.  He still does not have any insurance  CB# [phone]  Thanks [employer]

## 2018-06-02 NOTE — Telephone Encounter (Signed)
Rep here today--please check.

## 2018-06-02 NOTE — Telephone Encounter (Signed)
Unable to reach patient.  I am trying to get in touch witht he rep to see if he can get me some samples.  I do not have any right now

## 2018-06-03 NOTE — Telephone Encounter (Signed)
Patient advised of sample put up for him

## 2018-06-05 ENCOUNTER — Other Ambulatory Visit: Payer: Self-pay

## 2018-06-05 NOTE — Telephone Encounter (Signed)
Patient called requesting refills. Thanks!  

## 2018-06-07 MED ORDER — FENOFIBRATE 160 MG PO TABS: 160.0000 mg | ORAL_TABLET | Freq: Every day | ORAL | 3 refills | Status: DC

## 2018-07-16 ENCOUNTER — Ambulatory Visit: Payer: Self-pay | Admitting: Family Medicine

## 2018-07-21 ENCOUNTER — Ambulatory Visit: Payer: Self-pay | Admitting: Family Medicine

## 2018-07-22 ENCOUNTER — Encounter: Payer: Self-pay | Admitting: Family Medicine

## 2018-07-22 ENCOUNTER — Ambulatory Visit: Payer: PRIVATE HEALTH INSURANCE | Admitting: Family Medicine

## 2018-07-22 VITALS — BP 128/74 | HR 70 | Temp 98.4°F | Resp 16 | Ht 67.0 in | Wt 205.0 lb

## 2018-07-22 DIAGNOSIS — E7849 Other hyperlipidemia: Secondary | ICD-10-CM | POA: Diagnosis not present

## 2018-07-22 DIAGNOSIS — I10 Essential (primary) hypertension: Secondary | ICD-10-CM | POA: Diagnosis not present

## 2018-07-22 DIAGNOSIS — E1165 Type 2 diabetes mellitus with hyperglycemia: Secondary | ICD-10-CM | POA: Diagnosis not present

## 2018-07-22 NOTE — Progress Notes (Signed)
Patient: Jeffrey [name] Male    DOB: 1956-02-03   62 y.o.   MRN: [id number] Visit Date: 07/22/2018  Today's Provider: [name], MD   Chief Complaint  Patient presents with  . Diabetes  . Hypertension  . Hyperlipidemia   Subjective:    HPI      Diabetes Mellitus Type II, Follow-up:  Pt stopped Trulicity due to cost. Lab Results  Component Value Date   HGBA1C 6.5 (A) 03/23/2018   HGBA1C 8.0 09/04/2017   HGBA1C 7.0 05/05/2017   Last seen for diabetes 4 months ago.  Management since then includes none. He reports good compliance with treatment. He is not having side effects.  Current symptoms include none and have been stable. Home blood sugar records: not being checked.   Episodes of hypoglycemia? no   Current Insulin Regimen: none Most Recent Eye Exam: up to date Weight trend: stable Prior visit with dietician: no Current diet: well balanced Current exercise: no regular exercise   Hypertension, follow-up:  BP Readings from Last 3 Encounters:  07/22/18 128/74  03/23/18 124/76  09/04/17 128/76    He was last seen for hypertension 4 months ago.  BP at that visit was 124/76. Management since that visit includes no changes .He reports good compliance with treatment. He is not having side effects.  He is not exercising. He is adherent to low salt diet.   Outside blood pressures are checked occasionally. He is experiencing none.  Patient denies none.   Cardiovascular risk factors include diabetes mellitus and dyslipidemia.    Lipid/Cholesterol, Follow-up:   Last seen for this 4 months ago.  Management since that visit includes none.  Last Lipid Panel:    Component Value Date/Time   CHOL 148 01/07/2017 1100   TRIG 361 (H) 01/07/2017 1100   HDL 25 (L) 01/07/2017 1100   CHOLHDL 5.9 (H) 01/07/2017 1100   CHOLHDL 20.3 07/19/2015 0504   VLDL UNABLE TO CALCULATE IF TRIGLYCERIDE OVER 400 mg/dL 07/19/2015 0504   LDLCALC 51 01/07/2017  1100    He reports good compliance with treatment. He is not having side effects. Allergies  Allergen Reactions  . Codeine Hives     Current Outpatient Medications:  .  albuterol (PROVENTIL HFA;VENTOLIN HFA) 108 (90 Base) MCG/ACT inhaler, Inhale 2 puffs into the lungs every 4 (four) hours as needed for wheezing or shortness of breath., Disp: , Rfl:  .  amLODipine (NORVASC) 10 MG tablet, Take 1 tablet (10 mg total) by mouth daily., Disp: 90 tablet, Rfl: 3 .  aspirin 81 MG tablet, Take 81 mg by mouth daily., Disp: , Rfl:  .  Dulaglutide (TRULICITY) 1.5 YW/7.3XT SOPN, Inject 1.5 mg into the skin once a week., Disp: 0.5 pen, Rfl: 11 .  fenofibrate 160 MG tablet, Take 1 tablet (160 mg total) by mouth daily., Disp: 90 tablet, Rfl: 3 .  fluticasone (FLONASE) 50 MCG/ACT nasal spray, Place 1 spray into both nostrils daily. (Patient taking differently: Place 1 spray into both nostrils daily as needed. ), Disp: 16 g, Rfl: 12 .  glipiZIDE (GLUCOTROL) 10 MG tablet, TAKE ONE-HALF TABLET BY  MOUTH DAILY BEFORE  BREAKFAST, Disp: 45 tablet, Rfl: 3 .  glucose blood (ONETOUCH VERIO) test strip, Check sugar twice daily, DX E11.9, Disp: 150 each, Rfl: 3 .  loratadine (CLARITIN) 10 MG tablet, Take 1 tablet (10 mg total) by mouth daily., Disp: 90 tablet, Rfl: 3 .  loratadine (CLARITIN) 10 MG tablet, TAKE  1 TABLET BY MOUTH EVERY DAY, Disp: 90 tablet, Rfl: 3 .  losartan (COZAAR) 100 MG tablet, Take 1 tablet (100 mg total) by mouth daily., Disp: 90 tablet, Rfl: 3 .  metFORMIN (GLUCOPHAGE) 1000 MG tablet, TAKE 1 TABLET (1,000 MG TOTAL) BY MOUTH 2 (TWO) TIMES DAILY WITH A MEAL., Disp: 60 tablet, Rfl: 11 .  naproxen (NAPROSYN) 500 MG tablet, Take 1 tablet (500 mg total) by mouth 2 (two) times daily with a meal. (Patient not taking: Reported on 09/04/2017), Disp: 20 tablet, Rfl: 0 .  omega-3 acid ethyl esters (LOVAZA) 1 g capsule, TAKE 1 CAPSULE BY MOUTH TWO TIMES DAILY, Disp: 180 capsule, Rfl: 3 .  omeprazole (PRILOSEC)  20 MG capsule, TAKE 1 CAPSULE BY MOUTH  DAILY, Disp: 90 capsule, Rfl: 3 .  ONETOUCH DELICA LANCETS 81O MISC, CHECK SUGAR ONCE DAILY, Disp: 100 each, Rfl: 12 .  pravastatin (PRAVACHOL) 40 MG tablet, TAKE 1 TABLET BY MOUTH  DAILY AT 6 PM., Disp: 90 tablet, Rfl: 3 .  pravastatin (PRAVACHOL) 80 MG tablet, Take 0.5 tablets (40 mg total) by mouth daily., Disp: 90 tablet, Rfl: 3 .  sildenafil (VIAGRA) 100 MG tablet, Take 100 mg by mouth daily as needed for erectile dysfunction., Disp: , Rfl:  .  sulfacetamide (BLEPH-10) 10 % ophthalmic solution, Place 2 drops into both eyes 4 (four) times daily. (Patient not taking: Reported on 09/04/2017), Disp: 15 mL, Rfl: 0 .  traMADol (ULTRAM) 50 MG tablet, Take by mouth every 6 (six) hours as needed., Disp: , Rfl:   Review of Systems  Constitutional: Negative.   HENT: Negative.   Eyes: Negative.   Respiratory: Negative.   Cardiovascular: Negative.   Gastrointestinal: Negative.   Endocrine: Negative.   Allergic/Immunologic: Negative.   Hematological: Negative.   Psychiatric/Behavioral: Negative.     Social History   Tobacco Use  . Smoking status: Former Smoker    Packs/day: 1.50    Types: Cigarettes    Last attempt to quit: 08/11/1984    Years since quitting: 33.9  . Smokeless tobacco: Never Used  Substance Use Topics  . Alcohol use: No    Alcohol/week: 0.0 standard drinks   Objective:   BP 128/74 (BP Location: Left Arm, Patient Position: Sitting, Cuff Size: Large)   Pulse 70   Temp 98.4 F (36.9 C)   Resp 16   Ht 5\' 7"  (1.702 m)   Wt 205 lb (93 kg)   SpO2 97%   BMI 32.11 kg/m  Vitals:   07/22/18 1611  BP: 128/74  Pulse: 70  Resp: 16  Temp: 98.4 F (36.9 C)  SpO2: 97%  Weight: 205 lb (93 kg)  Height: 5\' 7"  (1.702 m)     Physical Exam Constitutional:      Appearance: Normal appearance.  HENT:     Head: Normocephalic.     Nose: Nose normal.     Mouth/Throat:     Mouth: Mucous membranes are moist.  Eyes:     General: No  scleral icterus. Cardiovascular:     Rate and Rhythm: Normal rate and regular rhythm.     Pulses: Normal pulses.     Heart sounds: Normal heart sounds.  Pulmonary:     Effort: Pulmonary effort is normal.     Breath sounds: Normal breath sounds.  Abdominal:     Palpations: Abdomen is soft.  Musculoskeletal:        General: No swelling.  Skin:    General: Skin is warm and dry.  Neurological:     General: No focal deficit present.     Mental Status: He is alert. Mental status is at baseline.  Psychiatric:        Mood and Affect: Mood normal.        Behavior: Behavior normal.        Thought Content: Thought content normal.        Judgment: Judgment normal.         Assessment & Plan:     1. Type 2 diabetes mellitus with hyperglycemia, without long-term current use of insulin ([place]) Restart Trulicity with samples.RTC 3 -4 months. - Hemoglobin A1c  2. Essential hypertension  - CBC with Differential/Platelet - Comprehensive metabolic panel - TSH  3. Other hyperlipidemia  - Lipid panel     I have done the exam and reviewed the above chart and it is accurate to the best of my knowledge. Development worker, community has been used in this note in any air is in the dictation or transcription are unintentional.   [name], MD  [place]

## 2018-07-24 ENCOUNTER — Telehealth: Payer: Self-pay

## 2018-07-24 LAB — CBC WITH DIFFERENTIAL/PLATELET
Basophils Absolute: 0.1 10*3/uL (ref 0.0–0.2)
Basos: 1 %
EOS (ABSOLUTE): 0.1 10*3/uL (ref 0.0–0.4)
Eos: 2 %
Hematocrit: 43.8 % (ref 37.5–51.0)
Hemoglobin: 14.7 g/dL (ref 13.0–17.7)
Immature Grans (Abs): 0 10*3/uL (ref 0.0–0.1)
Immature Granulocytes: 0 %
Lymphocytes Absolute: 3.3 10*3/uL — ABNORMAL HIGH (ref 0.7–3.1)
Lymphs: 43 %
MCH: 27.9 pg (ref 26.6–33.0)
MCHC: 33.6 g/dL (ref 31.5–35.7)
MCV: 83 fL (ref 79–97)
Monocytes Absolute: 0.6 10*3/uL (ref 0.1–0.9)
Monocytes: 8 %
Neutrophils Absolute: 3.5 10*3/uL (ref 1.4–7.0)
Neutrophils: 46 %
Platelets: 286 10*3/uL (ref 150–450)
RBC: 5.27 x10E6/uL (ref 4.14–5.80)
RDW: 14.1 % (ref 12.3–15.4)
WBC: 7.6 10*3/uL (ref 3.4–10.8)

## 2018-07-24 LAB — COMPREHENSIVE METABOLIC PANEL
ALT: 26 IU/L (ref 0–44)
AST: 23 IU/L (ref 0–40)
Albumin/Globulin Ratio: 1.6 (ref 1.2–2.2)
Albumin: 4.5 g/dL (ref 3.6–4.8)
Alkaline Phosphatase: 45 IU/L (ref 39–117)
BUN/Creatinine Ratio: 13 (ref 10–24)
BUN: 18 mg/dL (ref 8–27)
Bilirubin Total: 0.4 mg/dL (ref 0.0–1.2)
CO2: 24 mmol/L (ref 20–29)
Calcium: 9.8 mg/dL (ref 8.6–10.2)
Chloride: 99 mmol/L (ref 96–106)
Creatinine, Ser: 1.42 mg/dL — ABNORMAL HIGH (ref 0.76–1.27)
GFR calc Af Amer: 60 mL/min/{1.73_m2} (ref >59)
GFR calc non Af Amer: 52 mL/min/{1.73_m2} — ABNORMAL LOW (ref >59)
Globulin, Total: 2.8 g/dL (ref 1.5–4.5)
Glucose: 146 mg/dL — ABNORMAL HIGH (ref 65–99)
Potassium: 4.4 mmol/L (ref 3.5–5.2)
Sodium: 138 mmol/L (ref 134–144)
Total Protein: 7.3 g/dL (ref 6.0–8.5)

## 2018-07-24 LAB — HEMOGLOBIN A1C
Est. average glucose Bld gHb Est-mCnc: 174 mg/dL
Hgb A1c MFr Bld: 7.7 % — ABNORMAL HIGH (ref 4.8–5.6)

## 2018-07-24 LAB — LIPID PANEL
Chol/HDL Ratio: 5.3 ratio — ABNORMAL HIGH (ref 0.0–5.0)
Cholesterol, Total: 142 mg/dL (ref 100–199)
HDL: 27 mg/dL — ABNORMAL LOW (ref >39)
LDL Calculated: 39 mg/dL (ref 0–99)
Triglycerides: 380 mg/dL — ABNORMAL HIGH (ref 0–149)
VLDL Cholesterol Cal: 76 mg/dL — ABNORMAL HIGH (ref 5–40)

## 2018-07-24 LAB — TSH: TSH: 1.93 u[IU]/mL (ref 0.450–4.500)

## 2018-07-24 NOTE — Telephone Encounter (Signed)
-----   Message from Jerrol Banana., MD sent at 07/24/2018  8:55 AM EST ----- Stable.

## 2018-07-24 NOTE — Telephone Encounter (Signed)
Pt advised.   Thanks,   -Mirela Parsley  

## 2018-07-27 ENCOUNTER — Other Ambulatory Visit: Payer: Self-pay | Admitting: Family Medicine

## 2018-07-27 DIAGNOSIS — E1165 Type 2 diabetes mellitus with hyperglycemia: Secondary | ICD-10-CM

## 2018-07-27 MED ORDER — GLUCOSE BLOOD VI STRP: ORAL_STRIP | 3 refills | Status: DC

## 2018-07-27 MED ORDER — OMEPRAZOLE 20 MG PO CPDR: 20.0000 mg | DELAYED_RELEASE_CAPSULE | Freq: Every day | ORAL | 3 refills | Status: DC

## 2018-07-27 MED ORDER — AMLODIPINE BESYLATE 10 MG PO TABS: 10.0000 mg | ORAL_TABLET | Freq: Every day | ORAL | 3 refills | Status: DC

## 2018-07-27 MED ORDER — DULAGLUTIDE 1.5 MG/0.5ML ~~LOC~~ SOAJ: 1.5000 mg | SUBCUTANEOUS | 11 refills | Status: DC

## 2018-07-27 MED ORDER — ONETOUCH DELICA LANCETS 33G MISC: 12 refills | Status: DC

## 2018-07-27 NOTE — Telephone Encounter (Signed)
Pt requesting refill of Dulaglutide (TRULICITY) 1.5 AF/4.2DL SOPN, amLODipine (NORVASC) 10 MG tablet, glucose blood (ONETOUCH VERIO) test strip and ONETOUCH DELICA LANCETS 25G MISC

## 2018-07-31 ENCOUNTER — Other Ambulatory Visit: Payer: Self-pay | Admitting: Family Medicine

## 2018-07-31 DIAGNOSIS — E1165 Type 2 diabetes mellitus with hyperglycemia: Secondary | ICD-10-CM

## 2018-08-14 ENCOUNTER — Other Ambulatory Visit: Payer: Self-pay | Admitting: Family Medicine

## 2018-08-14 DIAGNOSIS — E1169 Type 2 diabetes mellitus with other specified complication: Secondary | ICD-10-CM

## 2018-08-17 ENCOUNTER — Other Ambulatory Visit: Payer: Self-pay

## 2018-08-17 ENCOUNTER — Other Ambulatory Visit: Payer: Self-pay | Admitting: Family Medicine

## 2018-08-17 MED ORDER — GLUCOSE BLOOD VI STRP: ORAL_STRIP | 3 refills | Status: DC

## 2018-09-11 ENCOUNTER — Other Ambulatory Visit: Payer: Self-pay | Admitting: Family Medicine

## 2018-09-11 DIAGNOSIS — E785 Hyperlipidemia, unspecified: Secondary | ICD-10-CM

## 2018-09-11 DIAGNOSIS — E1165 Type 2 diabetes mellitus with hyperglycemia: Secondary | ICD-10-CM

## 2018-11-17 ENCOUNTER — Other Ambulatory Visit: Payer: Self-pay | Admitting: Family Medicine

## 2018-12-09 ENCOUNTER — Other Ambulatory Visit: Payer: Self-pay

## 2018-12-09 MED ORDER — LORATADINE 10 MG PO TABS: 10.0000 mg | ORAL_TABLET | Freq: Every day | ORAL | 3 refills | Status: DC

## 2018-12-25 ENCOUNTER — Other Ambulatory Visit: Payer: Self-pay | Admitting: Family Medicine

## 2019-01-14 ENCOUNTER — Ambulatory Visit (INDEPENDENT_AMBULATORY_CARE_PROVIDER_SITE_OTHER): Payer: PRIVATE HEALTH INSURANCE | Admitting: Family Medicine

## 2019-01-14 ENCOUNTER — Encounter: Payer: Self-pay | Admitting: Family Medicine

## 2019-01-14 ENCOUNTER — Other Ambulatory Visit: Payer: Self-pay

## 2019-01-14 VITALS — BP 128/78 | HR 72 | Temp 98.3°F | Resp 16 | Wt 202.0 lb

## 2019-01-14 DIAGNOSIS — I10 Essential (primary) hypertension: Secondary | ICD-10-CM

## 2019-01-14 DIAGNOSIS — Z23 Encounter for immunization: Secondary | ICD-10-CM

## 2019-01-14 DIAGNOSIS — E7849 Other hyperlipidemia: Secondary | ICD-10-CM | POA: Diagnosis not present

## 2019-01-14 DIAGNOSIS — L570 Actinic keratosis: Secondary | ICD-10-CM

## 2019-01-14 DIAGNOSIS — E1165 Type 2 diabetes mellitus with hyperglycemia: Secondary | ICD-10-CM | POA: Diagnosis not present

## 2019-01-14 DIAGNOSIS — Z0000 Encounter for general adult medical examination without abnormal findings: Secondary | ICD-10-CM

## 2019-01-14 DIAGNOSIS — Z125 Encounter for screening for malignant neoplasm of prostate: Secondary | ICD-10-CM

## 2019-01-14 NOTE — Progress Notes (Signed)
Patient: Jeffrey [name], Male    DOB: 04/07/1956, 63 y.o.   MRN: [id number] Visit Date: 01/14/2019  Today's Provider: [name], MD   Chief Complaint  Patient presents with  . Annual Exam   Subjective:     Annual physical exam Jeffrey [name] is a 63 y.o. male who presents today for health maintenance and complete physical. He feels well. He reports exercising not regularly, but he does stay active. He reports he is sleeping well.  Colonoscopy- 10/03/2015. Colonic mucosa. Repeat 09/2020.  Tdap- 10/30/2007.    Review of Systems  Constitutional: Negative.   HENT: Negative.   Eyes: Negative.   Respiratory: Negative.   Cardiovascular: Negative.   Gastrointestinal: Negative.   Endocrine: Negative.   Genitourinary: Negative.   Musculoskeletal: Positive for arthralgias.  Skin: Negative.   Allergic/Immunologic: Positive for environmental allergies.  Neurological: Negative.   Hematological: Negative.   Psychiatric/Behavioral: Negative.     Social History      He  reports that he quit smoking about 34 years ago. His smoking use included cigarettes. He smoked 1.50 packs per day. He has never used smokeless tobacco. He reports that he does not drink alcohol or use drugs.       Social History   Socioeconomic History  . Marital status: Married    Spouse name: Mateo Flow  . Number of children: 3  . Years of education: bachelors  . Highest education level: Not on file  Occupational History    Employer: [employer]  . Financial resource strain: Not on file  . Food insecurity:    Worry: Not on file    Inability: Not on file  . Transportation needs:    Medical: Not on file    Non-medical: Not on file  Tobacco Use  . Smoking status: Former Smoker    Packs/day: 1.50    Types: Cigarettes    Last attempt to quit: 08/11/1984    Years since quitting: 34.4  . Smokeless tobacco: Never Used  Substance and Sexual Activity  .  Alcohol use: No    Alcohol/week: 0.0 standard drinks  . Drug use: No  . Sexual activity: Not on file  Lifestyle  . Physical activity:    Days per week: Not on file    Minutes per session: Not on file  . Stress: Not on file  Relationships  . Social connections:    Talks on phone: Not on file    Gets together: Not on file    Attends religious service: Not on file    Active member of club or organization: Not on file    Attends meetings of clubs or organizations: Not on file    Relationship status: Not on file  Other Topics Concern  . Not on file  Social History Narrative   Mr. Hausmann has 3 biological children, and 2 adopted children.    Past Medical History:  Diagnosis Date  . Actinic keratosis   . Cholecystitis   . Diabetes mellitus ([place])   . Diverticulosis   . Erectile dysfunction   . GERD (gastroesophageal reflux disease)   . Hyperlipidemia   . Hypertension   . Myalgia   . Obesity   . Peyronie's disease   . Plantar fasciitis   . Seasonal allergies      Patient Active Problem List   Diagnosis Date Noted  . Sinusitis 09/08/2015  . Hypertriglyceridemia 07/19/2015  . Pancreatitis 07/16/2015  .  ED (erectile dysfunction) of organic origin 04/18/2015  . Hypertension 12/06/2014  . Seasonal allergies 12/06/2014  . Cholecystitis 12/06/2014  . Diverticulosis 12/06/2014  . GERD (gastroesophageal reflux disease) 12/06/2014  . Diabetes mellitus ([place]) 12/06/2014  . Plantar fasciitis 12/06/2014  . Actinic keratosis 12/06/2014  . Peyronie's disease 12/06/2014  . Erectile dysfunction 12/06/2014  . Obesity 12/06/2014  . Hyperlipidemia 12/06/2014  . Myalgia 12/06/2014    Past Surgical History:  Procedure Laterality Date  . ARTHROSCOPIC REPAIR ACL    . HERNIA REPAIR     x 2  . LITHOTRIPSY    . MENISCUS REPAIR      Family History        Family Status  Relation Name Status  . Father  Deceased  . Sister 1 Alive  . Brother 1 Alive  . Daughter  Alive  . Son   Alive  . Brother 2 Alive  . Brother 3 Alive  . Brother 4 Alive  . Sister 2 Alive  . Sister 3 Alive  . Sister 4 Alive  . Son  Alive  . MGM  (Not Specified)  . MGF  Deceased  . PGM  (Not Specified)  . PGF  Deceased  . Mother  Alive        His family history includes ALS in his father; Cancer in his maternal grandmother and paternal grandmother; Club foot in his son; Diabetes in his maternal grandfather, mother, and paternal grandfather; Drug abuse in his sister; Heart attack in his father and paternal grandfather; Heart disease in his brother and brother; Hypertension in his brother, brother, brother, father, and paternal grandfather.      Allergies  Allergen Reactions  . Codeine Hives     Current Outpatient Medications:  .  albuterol (PROVENTIL HFA;VENTOLIN HFA) 108 (90 Base) MCG/ACT inhaler, Inhale 2 puffs into the lungs every 4 (four) hours as needed for wheezing or shortness of breath., Disp: , Rfl:  .  amLODipine (NORVASC) 10 MG tablet, TAKE 1 TABLET BY MOUTH  DAILY, Disp: 90 tablet, Rfl: 3 .  aspirin 81 MG tablet, Take 81 mg by mouth daily., Disp: , Rfl:  .  fenofibrate 160 MG tablet, Take 1 tablet (160 mg total) by mouth daily., Disp: 90 tablet, Rfl: 3 .  fluticasone (FLONASE) 50 MCG/ACT nasal spray, Place 1 spray into both nostrils daily. (Patient taking differently: Place 1 spray into both nostrils daily as needed. ), Disp: 16 g, Rfl: 12 .  glipiZIDE (GLUCOTROL) 10 MG tablet, TAKE ONE-HALF TABLET BY  MOUTH DAILY BEFORE  BREAKFAST, Disp: 45 tablet, Rfl: 3 .  glucose blood (ONETOUCH VERIO) test strip, Check sugar twice daily, DX E11.9, Disp: 150 each, Rfl: 3 .  loratadine (CLARITIN) 10 MG tablet, Take 1 tablet (10 mg total) by mouth daily., Disp: 90 tablet, Rfl: 3 .  losartan (COZAAR) 100 MG tablet, TAKE 1 TABLET BY MOUTH  DAILY, Disp: 90 tablet, Rfl: 3 .  metFORMIN (GLUCOPHAGE) 1000 MG tablet, TAKE 1 TABLET BY MOUTH TWO  TIMES DAILY WITH MEALS, Disp: 180 tablet, Rfl: 3 .   omega-3 acid ethyl esters (LOVAZA) 1 g capsule, TAKE 1 CAPSULE BY MOUTH TWO TIMES DAILY, Disp: 180 capsule, Rfl: 3 .  omeprazole (PRILOSEC) 20 MG capsule, TAKE 1 CAPSULE BY MOUTH  DAILY, Disp: 90 capsule, Rfl: 3 .  ONETOUCH DELICA LANCETS 93A MISC, CHECK BLOOD SUGAR TWO TIMES DAILY AS DIRECTED, Disp: 200 each, Rfl: 3 .  pravastatin (PRAVACHOL) 40 MG tablet, TAKE 1 TABLET BY  MOUTH  DAILY AT 6 PM., Disp: 90 tablet, Rfl: 3 .  traMADol (ULTRAM) 50 MG tablet, Take by mouth every 6 (six) hours as needed., Disp: , Rfl:  .  TRULICITY 1.5 GQ/6.7YP SOPN, INJECT THE CONTENTS OF 1  PEN (1.5MG ) SUBCUTANEOUSLY  WEEKLY AS DIRECTED, Disp: 6 mL, Rfl: 3 .  sulfacetamide (BLEPH-10) 10 % ophthalmic solution, Place 2 drops into both eyes 4 (four) times daily. (Patient not taking: Reported on 09/04/2017), Disp: 15 mL, Rfl: 0   Patient Care Team: Jerrol Banana., MD as PCP - General (Family Medicine)    Objective:    Vitals: BP 128/78 (BP Location: Left Arm, Patient Position: Sitting, Cuff Size: Normal)   Pulse 72   Temp 98.3 F (36.8 C)   Resp 16   Wt 202 lb (91.6 kg)   SpO2 98%   BMI 31.64 kg/m    Vitals:   01/14/19 1406  BP: 128/78  Pulse: 72  Resp: 16  Temp: 98.3 F (36.8 C)  SpO2: 98%  Weight: 202 lb (91.6 kg)     Physical Exam Vitals signs reviewed.  Constitutional:      Appearance: He is well-developed.  HENT:     Head: Normocephalic and atraumatic.     Right Ear: External ear normal.     Left Ear: External ear normal.     Nose: Nose normal.  Eyes:     General: No scleral icterus.    Conjunctiva/sclera: Conjunctivae normal.  Neck:     Thyroid: No thyromegaly.  Cardiovascular:     Rate and Rhythm: Normal rate and regular rhythm.     Heart sounds: Normal heart sounds.  Pulmonary:     Effort: Pulmonary effort is normal.     Breath sounds: Normal breath sounds.  Abdominal:     Palpations: Abdomen is soft.  Lymphadenopathy:     Cervical: No cervical adenopathy.  Skin:     General: Skin is warm and dry.     Comments: 6 inch cut on left forearm from earlier today.Multiple AKs.  Neurological:     Mental Status: He is alert and oriented to person, place, and time.     Comments: Foot exam normal.  Psychiatric:        Mood and Affect: Mood normal.        Behavior: Behavior normal.        Thought Content: Thought content normal.        Judgment: Judgment normal.      Depression Screen PHQ 2/9 Scores 01/14/2019 09/20/2016 04/18/2015  PHQ - 2 Score 0 0 0       Assessment & Plan:     Routine Health Maintenance and Physical Exam  Exercise Activities and Dietary recommendations Goals   None     Immunization History  Administered Date(s) Administered  . Influenza Inj Mdck Quad Pf 05/27/2018  . Influenza Split 04/22/2012  . Influenza,inj,Quad PF,6+ Mos 05/04/2013, 04/28/2014, 04/20/2015, 05/05/2017  . Pneumococcal Polysaccharide-23 06/28/2010  . Tdap 10/30/2007  . Zoster 03/12/2016    Health Maintenance  Topic Date Due  . FOOT EXAM  08/27/1964  . HIV Screening  08/27/1969  . TETANUS/TDAP  10/29/2017  . HEMOGLOBIN A1C  01/22/2019  . INFLUENZA VACCINE  03/13/2019  . OPHTHALMOLOGY EXAM  05/15/2019  . COLONOSCOPY  10/02/2020  . Hepatitis C Screening  Completed     Discussed health benefits of physical activity, and encouraged him to engage in regular exercise appropriate for his age and condition.  1. Annual physical exam   2. Type 2 diabetes mellitus with hyperglycemia, without long-term current use of insulin (HCC)  - CBC with Differential/Platelet - Hemoglobin A1c  3. Essential hypertension  - Comprehensive metabolic panel  4. Other hyperlipidemia  - Lipid panel - TSH  5. Prostate cancer screening  - PSA  6. Need for Td vaccine  - Td : Tetanus/diphtheria >7yo Preservative  free  7. AK (actinic keratosis)  - Ambulatory referral to Dermatology    [name], MD  [facility]

## 2019-01-15 LAB — LIPID PANEL
Chol/HDL Ratio: 5.1 ratio — ABNORMAL HIGH (ref 0.0–5.0)
Cholesterol, Total: 137 mg/dL (ref 100–199)
HDL: 27 mg/dL — ABNORMAL LOW (ref >39)
LDL Calculated: 44 mg/dL (ref 0–99)
Triglycerides: 330 mg/dL — ABNORMAL HIGH (ref 0–149)
VLDL Cholesterol Cal: 66 mg/dL — ABNORMAL HIGH (ref 5–40)

## 2019-01-15 LAB — CBC WITH DIFFERENTIAL/PLATELET
Basophils Absolute: 0.1 10*3/uL (ref 0.0–0.2)
Basos: 1 %
EOS (ABSOLUTE): 0.1 10*3/uL (ref 0.0–0.4)
Eos: 1 %
Hematocrit: 45.1 % (ref 37.5–51.0)
Hemoglobin: 15 g/dL (ref 13.0–17.7)
Immature Grans (Abs): 0 10*3/uL (ref 0.0–0.1)
Immature Granulocytes: 0 %
Lymphocytes Absolute: 3.5 10*3/uL — ABNORMAL HIGH (ref 0.7–3.1)
Lymphs: 36 %
MCH: 28 pg (ref 26.6–33.0)
MCHC: 33.3 g/dL (ref 31.5–35.7)
MCV: 84 fL (ref 79–97)
Monocytes Absolute: 0.7 10*3/uL (ref 0.1–0.9)
Monocytes: 8 %
Neutrophils Absolute: 5.2 10*3/uL (ref 1.4–7.0)
Neutrophils: 54 %
Platelets: 274 10*3/uL (ref 150–450)
RBC: 5.36 x10E6/uL (ref 4.14–5.80)
RDW: 14 % (ref 11.6–15.4)
WBC: 9.7 10*3/uL (ref 3.4–10.8)

## 2019-01-15 LAB — COMPREHENSIVE METABOLIC PANEL
ALT: 24 IU/L (ref 0–44)
AST: 22 IU/L (ref 0–40)
Albumin/Globulin Ratio: 1.6 (ref 1.2–2.2)
Albumin: 4.6 g/dL (ref 3.8–4.8)
Alkaline Phosphatase: 39 IU/L (ref 39–117)
BUN/Creatinine Ratio: 17 (ref 10–24)
BUN: 26 mg/dL (ref 8–27)
Bilirubin Total: 0.4 mg/dL (ref 0.0–1.2)
CO2: 21 mmol/L (ref 20–29)
Calcium: 9.8 mg/dL (ref 8.6–10.2)
Chloride: 99 mmol/L (ref 96–106)
Creatinine, Ser: 1.5 mg/dL — ABNORMAL HIGH (ref 0.76–1.27)
GFR calc Af Amer: 56 mL/min/{1.73_m2} — ABNORMAL LOW (ref >59)
GFR calc non Af Amer: 49 mL/min/{1.73_m2} — ABNORMAL LOW (ref >59)
Globulin, Total: 2.9 g/dL (ref 1.5–4.5)
Glucose: 71 mg/dL (ref 65–99)
Potassium: 4.8 mmol/L (ref 3.5–5.2)
Sodium: 137 mmol/L (ref 134–144)
Total Protein: 7.5 g/dL (ref 6.0–8.5)

## 2019-01-15 LAB — HEMOGLOBIN A1C
Est. average glucose Bld gHb Est-mCnc: 154 mg/dL
Hgb A1c MFr Bld: 7 % — ABNORMAL HIGH (ref 4.8–5.6)

## 2019-01-15 LAB — TSH: TSH: 1.9 u[IU]/mL (ref 0.450–4.500)

## 2019-01-15 LAB — PSA: Prostate Specific Ag, Serum: 1 ng/mL (ref 0.0–4.0)

## 2019-04-12 ENCOUNTER — Other Ambulatory Visit: Payer: Self-pay | Admitting: Family Medicine

## 2019-04-12 NOTE — Telephone Encounter (Signed)
Received a message from after hours service requesting a refill on fluticasone (FLONASE) 50 MCG/ACT nasal spray.  He would like this sent to [facility]

## 2019-04-13 MED ORDER — FLUTICASONE PROPIONATE 50 MCG/ACT NA SUSP: 2.0000 | Freq: Every day | NASAL | 12 refills | Status: DC

## 2019-04-26 ENCOUNTER — Other Ambulatory Visit: Payer: Self-pay | Admitting: Family Medicine

## 2019-04-27 ENCOUNTER — Other Ambulatory Visit: Payer: Self-pay | Admitting: Family Medicine

## 2019-04-27 MED ORDER — LOSARTAN POTASSIUM 100 MG PO TABS: 100.0000 mg | ORAL_TABLET | Freq: Every day | ORAL | 3 refills | Status: DC

## 2019-04-27 NOTE — Telephone Encounter (Signed)
[facility] faxed refill request for the following medications: ° °losartan (COZAAR) 100 MG tablet  ° °Please advise. ° °

## 2019-04-27 NOTE — Telephone Encounter (Signed)
Medication was sent into the pharmacy.  

## 2019-05-06 ENCOUNTER — Other Ambulatory Visit: Payer: Self-pay | Admitting: Family Medicine

## 2019-05-06 DIAGNOSIS — E1169 Type 2 diabetes mellitus with other specified complication: Secondary | ICD-10-CM

## 2019-05-20 ENCOUNTER — Ambulatory Visit: Payer: Self-pay | Admitting: Family Medicine

## 2019-05-24 ENCOUNTER — Other Ambulatory Visit: Payer: Self-pay

## 2019-05-24 ENCOUNTER — Ambulatory Visit (INDEPENDENT_AMBULATORY_CARE_PROVIDER_SITE_OTHER): Payer: PRIVATE HEALTH INSURANCE | Admitting: Family Medicine

## 2019-05-24 ENCOUNTER — Encounter: Payer: Self-pay | Admitting: Family Medicine

## 2019-05-24 VITALS — BP 138/62 | HR 80 | Resp 16 | Ht 67.0 in | Wt 204.0 lb

## 2019-05-24 DIAGNOSIS — E7849 Other hyperlipidemia: Secondary | ICD-10-CM

## 2019-05-24 DIAGNOSIS — E1165 Type 2 diabetes mellitus with hyperglycemia: Secondary | ICD-10-CM | POA: Diagnosis not present

## 2019-05-24 DIAGNOSIS — Z23 Encounter for immunization: Secondary | ICD-10-CM

## 2019-05-24 DIAGNOSIS — I10 Essential (primary) hypertension: Secondary | ICD-10-CM

## 2019-05-24 LAB — POCT GLYCOSYLATED HEMOGLOBIN (HGB A1C)
Est. average glucose Bld gHb Est-mCnc: 148
Hemoglobin A1C: 6.8 % — AB (ref 4.0–5.6)

## 2019-05-24 NOTE — Progress Notes (Signed)
Patient: Jeffrey [name] Male    DOB: November 14, 1955   63 y.o.   MRN: [id number] Visit Date: 05/24/2019  Today's Provider: [name], MD   Chief Complaint  Patient presents with  . Diabetes  . Hypertension  . Neck Pain   Subjective:   HPI  Diabetes Mellitus Type II, Follow-up:   Lab Results  Component Value Date   HGBA1C 6.8 (A) 05/24/2019   HGBA1C 7.0 (H) 01/14/2019   HGBA1C 7.7 (H) 07/23/2018    Last seen for diabetes 4 months ago.  Management since then includes no changes. He reports good compliance with treatment. He is not having side effects.  Current symptoms include none and have been stable. Home blood sugar records: fasting range: 150s  Episodes of hypoglycemia? no  Most Recent Eye Exam: up to date Weight trend: stable Prior visit with dietician: No Current exercise: no regular exercise Current diet habits: well balanced  Pertinent Labs:    Component Value Date/Time   CHOL 137 01/14/2019 1504   TRIG 330 (H) 01/14/2019 1504   HDL 27 (L) 01/14/2019 1504   LDLCALC 44 01/14/2019 1504   CREATININE 1.50 (H) 01/14/2019 1504    Wt Readings from Last 3 Encounters:  05/24/19 204 lb (92.5 kg)  01/14/19 202 lb (91.6 kg)  07/22/18 205 lb (93 kg)   Allergies  Allergen Reactions  . Codeine Hives     Current Outpatient Medications:  .  albuterol (PROVENTIL HFA;VENTOLIN HFA) 108 (90 Base) MCG/ACT inhaler, Inhale 2 puffs into the lungs every 4 (four) hours as needed for wheezing or shortness of breath., Disp: , Rfl:  .  amLODipine (NORVASC) 10 MG tablet, TAKE 1 TABLET BY MOUTH  DAILY, Disp: 90 tablet, Rfl: 3 .  aspirin 81 MG tablet, Take 81 mg by mouth daily., Disp: , Rfl:  .  fenofibrate 160 MG tablet, TAKE 1 TABLET BY MOUTH  DAILY, Disp: 90 tablet, Rfl: 3 .  fluticasone (FLONASE) 50 MCG/ACT nasal spray, Place 2 sprays into both nostrils daily., Disp: 16 g, Rfl: 12 .  glipiZIDE (GLUCOTROL) 10 MG tablet, TAKE ONE-HALF TABLET BY  MOUTH  DAILY BEFORE  BREAKFAST, Disp: 45 tablet, Rfl: 3 .  glucose blood (ONETOUCH VERIO) test strip, Check sugar twice daily, DX E11.9, Disp: 150 each, Rfl: 3 .  loratadine (CLARITIN) 10 MG tablet, Take 1 tablet (10 mg total) by mouth daily., Disp: 90 tablet, Rfl: 3 .  losartan (COZAAR) 100 MG tablet, Take 1 tablet (100 mg total) by mouth daily., Disp: 90 tablet, Rfl: 3 .  metFORMIN (GLUCOPHAGE) 1000 MG tablet, TAKE 1 TABLET BY MOUTH TWO  TIMES DAILY WITH MEALS, Disp: 180 tablet, Rfl: 3 .  omega-3 acid ethyl esters (LOVAZA) 1 g capsule, TAKE 1 CAPSULE BY MOUTH TWO TIMES DAILY, Disp: 180 capsule, Rfl: 3 .  omeprazole (PRILOSEC) 20 MG capsule, TAKE 1 CAPSULE BY MOUTH  DAILY, Disp: 90 capsule, Rfl: 3 .  ONETOUCH DELICA LANCETS [id number] MISC, CHECK BLOOD SUGAR TWO TIMES DAILY AS DIRECTED, Disp: 200 each, Rfl: 3 .  pravastatin (PRAVACHOL) 40 MG tablet, TAKE 1 TABLET BY MOUTH  DAILY AT 6 PM., Disp: 90 tablet, Rfl: 3 .  traMADol (ULTRAM) 50 MG tablet, Take by mouth every 6 (six) hours as needed., Disp: , Rfl:  .  TRULICITY 1.5 0000000 SOPN, INJECT THE CONTENTS OF 1  PEN (1.5MG ) SUBCUTANEOUSLY  WEEKLY AS DIRECTED, Disp: 6 mL, Rfl: 3 .  sulfacetamide (BLEPH-10) 10 % ophthalmic  solution, Place 2 drops into both eyes 4 (four) times daily. (Patient not taking: Reported on 09/04/2017), Disp: 15 mL, Rfl: 0  Review of Systems  Constitutional: Negative for activity change and fatigue.  HENT: Negative.   Eyes: Negative.   Respiratory: Negative for cough and shortness of breath.   Cardiovascular: Negative for chest pain, palpitations and leg swelling.  Gastrointestinal: Negative.   Endocrine: Negative for cold intolerance, heat intolerance, polydipsia, polyphagia and polyuria.  Musculoskeletal: Positive for arthralgias, neck pain and neck stiffness.  Allergic/Immunologic: Negative.   Neurological: Negative for dizziness and light-headedness.  Psychiatric/Behavioral: Negative for agitation, self-injury, sleep disturbance  and suicidal ideas. The patient is not nervous/anxious.     Social History   Tobacco Use  . Smoking status: Former Smoker    Packs/day: 1.50    Types: Cigarettes    Quit date: 08/11/1984    Years since quitting: 34.8  . Smokeless tobacco: Never Used  Substance Use Topics  . Alcohol use: No    Alcohol/week: 0.0 standard drinks      Objective:   BP 138/62   Pulse 80   Resp 16   Ht 5\' 7"  (1.702 m)   Wt 204 lb (92.5 kg)   SpO2 98%   BMI 31.95 kg/m  Vitals:   05/24/19 1631  BP: 138/62  Pulse: 80  Resp: 16  SpO2: 98%  Weight: 204 lb (92.5 kg)  Height: 5\' 7"  (1.702 m)  Body mass index is 31.95 kg/m.   Physical Exam Vitals signs reviewed.  Constitutional:      Appearance: Normal appearance.  HENT:     Head: Normocephalic.     Nose: Nose normal.     Mouth/Throat:     Mouth: Mucous membranes are moist.  Eyes:     General: No scleral icterus. Cardiovascular:     Rate and Rhythm: Normal rate and regular rhythm.     Pulses: Normal pulses.     Heart sounds: Normal heart sounds.  Pulmonary:     Effort: Pulmonary effort is normal.     Breath sounds: Normal breath sounds.  Abdominal:     Palpations: Abdomen is soft.  Musculoskeletal:        General: No swelling.  Skin:    General: Skin is warm and dry.  Neurological:     General: No focal deficit present.     Mental Status: He is alert. Mental status is at baseline.  Psychiatric:        Mood and Affect: Mood normal.        Behavior: Behavior normal.        Thought Content: Thought content normal.        Judgment: Judgment normal.      Results for orders placed or performed in visit on 05/24/19  POCT glycosylated hemoglobin (Hb A1C)  Result Value Ref Range   Hemoglobin A1C 6.8 (A) 4.0 - 5.6 %   HbA1c POC (<> result, manual entry)     HbA1c, POC (prediabetic range)     HbA1c, POC (controlled diabetic range)     Est. average glucose Bld gHb Est-mCnc 148        Assessment & Plan    1. Type 2 diabetes  mellitus with hyperglycemia, without long-term current use of insulin (HCC) --Controlled. - POCT glycosylated hemoglobin (Hb A1C)--6.8 today  2. Flu vaccine need  - Flu Vaccine QUAD 6+ mos PF IM (Fluarix Quad PF)  3. Essential hypertension Controlled.  On amlodipine and losartan  4. Other hyperlipidemia On pravastatin and fenofibrate.  Consider stopping fenofibrate in the future.     Ayari Liwanag Cranford Mon, MD  [facility]

## 2019-06-16 LAB — HM DIABETES EYE EXAM

## 2019-06-22 ENCOUNTER — Encounter: Payer: Self-pay | Admitting: *Deleted

## 2019-06-29 ENCOUNTER — Other Ambulatory Visit: Payer: Self-pay | Admitting: Family Medicine

## 2019-06-29 DIAGNOSIS — E785 Hyperlipidemia, unspecified: Secondary | ICD-10-CM

## 2019-06-29 DIAGNOSIS — E1169 Type 2 diabetes mellitus with other specified complication: Secondary | ICD-10-CM

## 2019-07-15 ENCOUNTER — Other Ambulatory Visit: Payer: Self-pay | Admitting: Family Medicine

## 2019-07-15 DIAGNOSIS — E1165 Type 2 diabetes mellitus with hyperglycemia: Secondary | ICD-10-CM

## 2019-07-15 NOTE — Telephone Encounter (Signed)
From [facility] 

## 2019-07-15 NOTE — Telephone Encounter (Signed)
Pt states his insurance will not pay for the one touch test strips and lancets any more.. otpum Is sending a contour next.  Pt requesting a new Rx for lancets and test strips for the contour next. Pt test BID.  Please send to   [place], [place] [phone] (Phone) [phone] (Fax)

## 2019-07-16 MED ORDER — BLOOD GLUCOSE METER KIT: PACK | 1 refills | Status: DC

## 2019-07-16 MED ORDER — BLOOD GLUCOSE METER KIT: PACK | 1 refills | Status: AC

## 2019-07-16 NOTE — Addendum Note (Signed)
Addended by: Judie Petit on: 07/16/2019 09:13 AM   Modules accepted: Orders

## 2019-07-23 ENCOUNTER — Other Ambulatory Visit: Payer: Self-pay | Admitting: Family Medicine

## 2019-07-23 DIAGNOSIS — E1165 Type 2 diabetes mellitus with hyperglycemia: Secondary | ICD-10-CM

## 2019-08-30 ENCOUNTER — Other Ambulatory Visit: Payer: Self-pay

## 2019-08-30 ENCOUNTER — Ambulatory Visit
Admission: EM | Admit: 2019-08-30 | Discharge: 2019-08-30 | Disposition: A | Discharge status: Home / Self Care | Payer: Medicare Other | Attending: Emergency Medicine | Admitting: Emergency Medicine

## 2019-08-30 ENCOUNTER — Encounter: Payer: Self-pay | Admitting: Emergency Medicine

## 2019-08-30 ENCOUNTER — Ambulatory Visit (INDEPENDENT_AMBULATORY_CARE_PROVIDER_SITE_OTHER): Payer: PRIVATE HEALTH INSURANCE | Admitting: Physician Assistant

## 2019-08-30 DIAGNOSIS — R55 Syncope and collapse: Secondary | ICD-10-CM | POA: Diagnosis not present

## 2019-08-30 DIAGNOSIS — R42 Dizziness and giddiness: Secondary | ICD-10-CM

## 2019-08-30 DIAGNOSIS — Z0189 Encounter for other specified special examinations: Secondary | ICD-10-CM

## 2019-08-30 DIAGNOSIS — I10 Essential (primary) hypertension: Secondary | ICD-10-CM

## 2019-08-30 DIAGNOSIS — R0602 Shortness of breath: Secondary | ICD-10-CM

## 2019-08-30 NOTE — Progress Notes (Signed)
Patient: Jeffrey [name] Male    DOB: 05-26-1956   64 y.o.   MRN: [id number] Visit Date: 08/30/2019  Today's Provider: Mar Daring, PA-C   Chief Complaint  Patient presents with  . Dizziness  . Shortness of Breath   Subjective:    Virtual Visit via Video Note  I connected with [name] on 08/30/19 at 10:40 AM EST by a video enabled telemedicine application and verified that I am speaking with the correct person using two identifiers.  Location: Patient: Home Provider: [facility]   I discussed the limitations of evaluation and management by telemedicine and the availability of in person appointments. The patient expressed understanding and agreed to proceed.  Dizziness This is a new problem. The current episode started today. Associated symptoms include weakness. Pertinent negatives include no abdominal pain, chest pain, headaches, sore throat, visual change or vomiting. The symptoms are aggravated by twisting, walking and bending.  Shortness of Breath This is a new problem. The current episode started today. Pertinent negatives include no abdominal pain, chest pain, ear pain, headaches, leg pain, leg swelling, rhinorrhea, sore throat, vomiting or wheezing. The symptoms are aggravated by any activity.   Patient reports that today while he was at work he was lifting 45 pound jugs of anti-freeze consistently and became very lightheaded, felt like he was going to pass out, and then became very short of breath. He does mention he had not had anything to eat or drink prior to work, but this is not uncommon for him. He denies any fever, cough, nausea, vomiting, chest pain, lower extremity edema, weakness on one side of the body. He also denies any loss of smell or taste. Does report that he has always had a less then normal sense of smell for over 20 years. He does report his niece was recently diagnosed with Covid-19 but that he has not been around her but that she has  been in his house. His employer is requesting him to have Covid testing.   It has been about an hour since the dizziness episode and he does report feeling better. No dizziness or SOB since resting. He does continue to have just some fatigue, feels run down. He has taken vitals and reports his initial BP reading was 139/78 and second was 159/81. HR was 92. Temp 97.4. O2 on room air was 96%. Fasting BS this morning was 203. BS now is 177.   Allergies  Allergen Reactions  . Codeine Hives     Current Outpatient Medications:  .  albuterol (PROVENTIL HFA;VENTOLIN HFA) 108 (90 Base) MCG/ACT inhaler, Inhale 2 puffs into the lungs every 4 (four) hours as needed for wheezing or shortness of breath., Disp: , Rfl:  .  amLODipine (NORVASC) 10 MG tablet, TAKE 1 TABLET BY MOUTH  DAILY, Disp: 90 tablet, Rfl: 3 .  aspirin 81 MG tablet, Take 81 mg by mouth daily., Disp: , Rfl:  .  blood glucose meter kit and supplies, Dispense based on patient and insurance preference. Use up to four times daily as directed. (FOR ICD-10 E10.9, E11.9)., Disp: 1 each, Rfl: 1 .  fenofibrate 160 MG tablet, TAKE 1 TABLET BY MOUTH  DAILY, Disp: 90 tablet, Rfl: 3 .  fluticasone (FLONASE) 50 MCG/ACT nasal spray, Place 2 sprays into both nostrils daily., Disp: 16 g, Rfl: 12 .  glipiZIDE (GLUCOTROL) 10 MG tablet, TAKE ONE-HALF TABLET BY  MOUTH DAILY BEFORE  BREAKFAST, Disp: 45 tablet, Rfl: 3 .  glucose blood (ONETOUCH VERIO) test strip, Check sugar twice daily, DX E11.9, Disp: 150 each, Rfl: 3 .  loratadine (CLARITIN) 10 MG tablet, Take 1 tablet (10 mg total) by mouth daily., Disp: 90 tablet, Rfl: 3 .  losartan (COZAAR) 100 MG tablet, Take 1 tablet (100 mg total) by mouth daily., Disp: 90 tablet, Rfl: 3 .  metFORMIN (GLUCOPHAGE) 1000 MG tablet, TAKE 1 TABLET BY MOUTH TWO  TIMES DAILY WITH MEALS, Disp: 180 tablet, Rfl: 1 .  Microlet Lancets MISC, USE UP TO 4 TIMES DAILY AS  DIRECTED, Disp: 200 each, Rfl: 3 .  omega-3 acid ethyl esters  (LOVAZA) 1 g capsule, TAKE 1 CAPSULE BY MOUTH TWO TIMES DAILY, Disp: 180 capsule, Rfl: 3 .  omeprazole (PRILOSEC) 20 MG capsule, TAKE 1 CAPSULE BY MOUTH  DAILY, Disp: 90 capsule, Rfl: 3 .  pravastatin (PRAVACHOL) 40 MG tablet, TAKE 1 TABLET BY MOUTH  DAILY AT 6 PM., Disp: 90 tablet, Rfl: 1 .  sulfacetamide (BLEPH-10) 10 % ophthalmic solution, Place 2 drops into both eyes 4 (four) times daily., Disp: 15 mL, Rfl: 0 .  traMADol (ULTRAM) 50 MG tablet, Take by mouth every 6 (six) hours as needed., Disp: , Rfl:  .  TRULICITY 1.5 LK/5.6YB SOPN, INJECT THE CONTENTS OF 1  PEN (1.5MG) SUBCUTANEOUSLY  WEEKLY AS DIRECTED, Disp: 6 mL, Rfl: 3  Review of Systems  HENT: Negative for ear pain, rhinorrhea and sore throat.   Respiratory: Positive for shortness of breath. Negative for wheezing.   Cardiovascular: Negative for chest pain and leg swelling.  Gastrointestinal: Negative for abdominal pain and vomiting.  Neurological: Positive for dizziness and weakness. Negative for headaches.    Social History   Tobacco Use  . Smoking status: Former Smoker    Packs/day: 1.50    Types: Cigarettes    Quit date: 08/11/1984    Years since quitting: 35.0  . Smokeless tobacco: Never Used  Substance Use Topics  . Alcohol use: No    Alcohol/week: 0.0 standard drinks      Objective:   There were no vitals taken for this visit. There were no vitals filed for this visit.There is no height or weight on file to calculate BMI.   Physical Exam Vitals reviewed.  Constitutional:      General: He is not in acute distress. Pulmonary:     Effort: No respiratory distress.  Neurological:     General: No focal deficit present.     Mental Status: He is alert and oriented to person, place, and time.      No results found for any visits on 08/30/19.     Assessment & Plan     1. Dizziness One isolated incident. Suspect most likely vasovagal near syncope with the repetitive heavy lifting. Now improved with rest. BP  and BS were both unremarkable. Discussed watchful waiting vs referral to Cardiology. He agrees with cardiology referral to R/O cardiac source. He does have h/o CAD with T2DM and has been seen by Dr. Clayborn Bigness in the past. Reports it was 5-6 years ago since he last saw Dr. Clayborn Bigness. New referral placed. Call if symptoms recur or worsen.   2. SOB (shortness of breath) See above medical treatment plan.  3. Vasovagal near syncope See above medical treatment plan. - Ambulatory referral to Cardiology   I discussed the assessment and treatment plan with the patient. The patient was provided an opportunity to ask questions and all were answered. The patient agreed with the plan and  demonstrated an understanding of the instructions.   The patient was advised to call back or seek an in-person evaluation if the symptoms worsen or if the condition fails to improve as anticipated.  I provided 21 minutes of non-face-to-face time during this encounter.     Mar Daring, PA-C  [facility]

## 2019-08-30 NOTE — Discharge Instructions (Addendum)
Your COVID test is pending.  You should self quarantine until your test result is back and is negative.    Take Tylenol as needed for fever or discomfort.  Rest and keep yourself hydrated with 8-10 glasses of water each day.    Go to the emergency department if you develop high fever, shortness of breath, severe diarrhea, or other concerning symptoms.    Your blood pressure is elevated today at 153/77.  Please have this rechecked by your primary care provider in 2-4 weeks.

## 2019-08-30 NOTE — ED Provider Notes (Signed)
Jeffrey [name]    CSN: [id number] Arrival date & time: 08/30/19  1452      History   Chief Complaint Chief Complaint  Patient presents with  . covid testing  . Dizziness    HPI Loretta Kluender is a 64 y.o. male.   Patient presents with request for a COVID test.  He reports a 1 hour episode of dizziness this morning while at work.  He states this occurred while he was lifting and moving heavy boxes.  He states the episode resolved with rest.  He denies headache, weakness, chest pain, shortness of breath, or other symptoms.  He denies current symptoms.  Patient states he checked his blood sugar at the time of dizziness this morning and it was in the low 200s.  He had a video visit with his PCP this morning about his episode of dizziness.  He states his work instructed him to come have a COVID test.  The history is provided by the patient.    Past Medical History:  Diagnosis Date  . Actinic keratosis   . Cholecystitis   . Diabetes mellitus ([place])   . Diverticulosis   . Erectile dysfunction   . GERD (gastroesophageal reflux disease)   . Hyperlipidemia   . Hypertension   . Myalgia   . Obesity   . Peyronie's disease   . Plantar fasciitis   . Seasonal allergies     Patient Active Problem List   Diagnosis Date Noted  . Sinusitis 09/08/2015  . Hypertriglyceridemia 07/19/2015  . Pancreatitis 07/16/2015  . ED (erectile dysfunction) of organic origin 04/18/2015  . Hypertension 12/06/2014  . Seasonal allergies 12/06/2014  . Cholecystitis 12/06/2014  . Diverticulosis 12/06/2014  . GERD (gastroesophageal reflux disease) 12/06/2014  . Diabetes mellitus ([place]) 12/06/2014  . Plantar fasciitis 12/06/2014  . Actinic keratosis 12/06/2014  . Peyronie's disease 12/06/2014  . Erectile dysfunction 12/06/2014  . Obesity 12/06/2014  . Hyperlipidemia 12/06/2014  . Myalgia 12/06/2014    Past Surgical History:  Procedure Laterality Date  . ARTHROSCOPIC REPAIR ACL    .  HERNIA REPAIR     x 2  . LITHOTRIPSY    . MENISCUS REPAIR         Home Medications    Prior to Admission medications   Medication Sig Start Date End Date Taking? Authorizing Provider  albuterol (PROVENTIL HFA;VENTOLIN HFA) 108 (90 Base) MCG/ACT inhaler Inhale 2 puffs into the lungs every 4 (four) hours as needed for wheezing or shortness of breath.   Yes [provider]  amLODipine (NORVASC) 10 MG tablet TAKE 1 TABLET BY MOUTH  DAILY 12/26/18  Yes Jerrol Banana., MD  aspirin 81 MG tablet Take 81 mg by mouth daily.   Yes [provider]  blood glucose meter kit and supplies Dispense based on patient and insurance preference. Use up to four times daily as directed. (FOR ICD-10 E10.9, E11.9). 07/16/19  Yes Jerrol Banana., MD  fenofibrate 160 MG tablet TAKE 1 TABLET BY MOUTH  DAILY 04/27/19  Yes Jerrol Banana., MD  fluticasone [facility]) 50 MCG/ACT nasal spray Place 2 sprays into both nostrils daily. 04/13/19  Yes Jerrol Banana., MD  glipiZIDE (GLUCOTROL) 10 MG tablet TAKE ONE-HALF TABLET BY  MOUTH DAILY BEFORE  BREAKFAST 05/07/19  Yes Jerrol Banana., MD  glucose blood [facility] VERIO) test strip Check sugar twice daily, DX E11.9 08/17/18  Yes Jerrol Banana., MD  loratadine (CLARITIN) 10  MG tablet Take 1 tablet (10 mg total) by mouth daily. 12/09/18  Yes Jerrol Banana., MD  losartan (COZAAR) 100 MG tablet Take 1 tablet (100 mg total) by mouth daily. 04/27/19  Yes Jerrol Banana., MD  metFORMIN (GLUCOPHAGE) 1000 MG tablet TAKE 1 TABLET BY MOUTH TWO  TIMES DAILY WITH MEALS 06/29/19  Yes Jerrol Banana., MD  Microlet Lancets MISC USE UP TO 4 TIMES DAILY AS  DIRECTED 07/24/19  Yes Jerrol Banana., MD  omeprazole (PRILOSEC) 20 MG capsule TAKE 1 CAPSULE BY MOUTH  DAILY 11/17/18  Yes Jerrol Banana., MD  pravastatin (PRAVACHOL) 40 MG tablet TAKE 1 TABLET BY MOUTH  DAILY AT 6 PM. 06/29/19  Yes Jerrol Banana., MD  traMADol (ULTRAM) 50 MG tablet Take by mouth every 6 (six) hours as needed.   Yes [provider]  TRULICITY 1.5 UT/6.5YY SOPN INJECT THE CONTENTS OF 1  PEN (1.5MG) SUBCUTANEOUSLY  WEEKLY AS DIRECTED 07/24/19  Yes Jerrol Banana., MD  omega-3 acid ethyl esters (LOVAZA) 1 g capsule TAKE 1 CAPSULE BY MOUTH TWO TIMES DAILY 09/22/17   Jerrol Banana., MD  sulfacetamide (BLEPH-10) 10 % ophthalmic solution Place 2 drops into both eyes 4 (four) times daily. 08/23/17   Carmon Ginsberg, PA    Family History Family History  Problem Relation Age of Onset  . Hypertension Father   . Heart attack Father   . ALS Father   . Drug abuse Sister        Secondary to head injury after MVA  . Club foot Son        Bilat  . Hypertension Brother   . Heart disease Brother   . Hypertension Brother   . Heart disease Brother   . Hypertension Brother   . Cancer Maternal Grandmother   . Diabetes Maternal Grandfather   . Cancer Paternal Grandmother   . Hypertension Paternal Grandfather   . Heart attack Paternal Grandfather   . Diabetes Paternal Grandfather   . Diabetes Mother        diet controlled    Social History Social History   Tobacco Use  . Smoking status: Former Smoker    Packs/day: 1.50    Types: Cigarettes    Quit date: 08/11/1984    Years since quitting: 35.0  . Smokeless tobacco: Never Used  Substance Use Topics  . Alcohol use: No    Alcohol/week: 0.0 standard drinks  . Drug use: No     Allergies   Codeine   Review of Systems Review of Systems  Constitutional: Negative for chills and fever.  HENT: Negative for ear pain and sore throat.   Eyes: Negative for pain and visual disturbance.  Respiratory: Negative for cough and shortness of breath.   Cardiovascular: Negative for chest pain and palpitations.  Gastrointestinal: Negative for abdominal pain and vomiting.  Genitourinary: Negative for dysuria and hematuria.  Musculoskeletal: Negative for  arthralgias and back pain.  Skin: Negative for color change and rash.  Neurological: Positive for dizziness. Negative for seizures, syncope, facial asymmetry, speech difficulty, weakness, numbness and headaches.  All other systems reviewed and are negative.    Physical Exam Triage Vital Signs ED Triage Vitals  Enc Vitals Group     BP      Pulse      Resp      Temp      Temp src      SpO2  Weight      Height      Head Circumference      Peak Flow      Pain Score      Pain Loc      Pain Edu?      Excl. in [place]?    No data found.  Updated Vital Signs BP (!) 153/77 (BP Location: Left Arm)   Pulse 84   Temp 98.8 F (37.1 C) (Oral)   Resp 18   Ht 5' 7"  (1.702 m)   Wt 200 lb (90.7 kg)   SpO2 97%   BMI 31.32 kg/m   Visual Acuity Right Eye Distance:   Left Eye Distance:   Bilateral Distance:    Right Eye Near:   Left Eye Near:    Bilateral Near:     Physical Exam Vitals and nursing note reviewed.  Constitutional:      General: He is not in acute distress.    Appearance: He is well-developed. He is not ill-appearing.  HENT:     Head: Normocephalic and atraumatic.     Right Ear: Tympanic membrane normal.     Left Ear: Tympanic membrane normal.     Nose: Nose normal.     Mouth/Throat:     Mouth: Mucous membranes are moist.     Pharynx: Oropharynx is clear.  Eyes:     Conjunctiva/sclera: Conjunctivae normal.  Cardiovascular:     Rate and Rhythm: Normal rate and regular rhythm.     Heart sounds: No murmur.  Pulmonary:     Effort: Pulmonary effort is normal. No respiratory distress.     Breath sounds: Normal breath sounds.  Abdominal:     General: Bowel sounds are normal.     Palpations: Abdomen is soft.     Tenderness: There is no abdominal tenderness. There is no guarding or rebound.  Musculoskeletal:     Cervical back: Neck supple.     Right lower leg: No edema.     Left lower leg: No edema.  Skin:    General: Skin is warm and dry.     Findings:  No rash.  Neurological:     General: No focal deficit present.     Mental Status: He is alert and oriented to person, place, and time.     Sensory: No sensory deficit.     Motor: No weakness.     Gait: Gait normal.  Psychiatric:        Mood and Affect: Mood normal.        Behavior: Behavior normal.      UC Treatments / Results  Labs (all labs ordered are listed, but only abnormal results are displayed) Labs Reviewed  NOVEL CORONAVIRUS, NAA    EKG   Radiology No results found.  Procedures Procedures (including critical care time)  Medications Ordered in UC Medications - No data to display  Initial Impression / Assessment and Plan / UC Course  I have reviewed the triage vital signs and the nursing notes.  Pertinent labs & imaging results that were available during my care of the patient were reviewed by me and considered in my medical decision making (see chart for details).    Patient request for COVID test.  Dizziness.  Elevated blood pressure with known hypertension.  Discussed with patient that his blood pressure is elevated today needs to be rechecked by his PCP in 2 to 4 weeks.  COVID test performed here.  Instructed patient to self quarantine until  the test result is back.  Discussed with patient that he can take Tylenol as needed for fever or discomfort.  Instructed patient to go to the emergency department if he develops high fever, shortness of breath, severe diarrhea, or other concerning symptoms.  Patient agrees with plan of care.   Final Clinical Impressions(s) / UC Diagnoses   Final diagnoses:  Patient request for diagnostic testing  Dizziness and giddiness  Elevated blood pressure reading in office with diagnosis of hypertension     Discharge Instructions     Your COVID test is pending.  You should self quarantine until your test result is back and is negative.    Take Tylenol as needed for fever or discomfort.  Rest and keep yourself hydrated with  8-10 glasses of water each day.    Go to the emergency department if you develop high fever, shortness of breath, severe diarrhea, or other concerning symptoms.    Your blood pressure is elevated today at 153/77.  Please have this rechecked by your primary care provider in 2-4 weeks.         ED Prescriptions    None     PDMP not reviewed this encounter.   Sharion Balloon, NP 08/30/19 1523

## 2019-08-30 NOTE — ED Triage Notes (Addendum)
Patient in today stating that he is requesting a covid test. Patient states his niece tested positive and was at his house Monday (08/23/19) and Tuesday (08/24/19). Patient states he had a dizzy spell this morning at work. Employer wanted him to come get tested for covid. Patient denies any other symptoms.

## 2019-08-31 ENCOUNTER — Other Ambulatory Visit: Payer: Self-pay | Admitting: Family Medicine

## 2019-08-31 ENCOUNTER — Encounter: Payer: Self-pay | Admitting: Physician Assistant

## 2019-08-31 NOTE — Telephone Encounter (Signed)
Requested Prescriptions  Pending Prescriptions Disp Refills  . omeprazole (PRILOSEC) 20 MG capsule [Pharmacy Med Name: OMEPRAZOLE  20MG   CAP] 90 capsule 3    Sig: TAKE 1 CAPSULE BY MOUTH  DAILY     Gastroenterology: Proton Pump Inhibitors Passed - 08/31/2019 12:32 PM      Passed - Valid encounter within last 12 months    Recent Outpatient Visits          Yesterday [place], [name] M, [place]   3 months ago Type 2 diabetes mellitus with hyperglycemia, without long-term current use of insulin [facility])   [facility] Jerrol Banana., MD   7 months ago Annual physical exam   [facility] Jerrol Banana., MD   1 year ago Type 2 diabetes mellitus with hyperglycemia, without long-term current use of insulin [facility])   [facility] Jerrol Banana., MD   1 year ago Essential hypertension   [facility] Jerrol Banana., MD      Future Appointments            In 3 weeks Jerrol Banana., MD [facility], [place]           . ALLERGY RELIEF 10 MG tablet [Pharmacy Med Name: LORATADINE  10MG   TAB] 90 tablet 3    Sig: TAKE 1 TABLET BY MOUTH  DAILY     Ear, Nose, and Throat:  Antihistamines Passed - 08/31/2019 12:32 PM      Passed - Valid encounter within last 12 months    Recent Outpatient Visits          Yesterday [place], Jennifer M, [place]   3 months ago Type 2 diabetes mellitus with hyperglycemia, without long-term current use of insulin [facility])   [facility] Jerrol Banana., MD   7 months ago Annual physical exam   [facility] Jerrol Banana., MD   1 year ago Type 2 diabetes mellitus with hyperglycemia, without long-term current use of insulin [facility])   [facility] Jerrol Banana., MD   1 year ago Essential hypertension   [facility]  Jerrol Banana., MD      Future Appointments            In 3 weeks Jerrol Banana., MD [facility], [place]

## 2019-09-01 LAB — NOVEL CORONAVIRUS, NAA: SARS-CoV-2, NAA: NOT DETECTED

## 2019-09-20 ENCOUNTER — Encounter: Payer: Self-pay | Admitting: Cardiovascular Disease

## 2019-09-20 ENCOUNTER — Other Ambulatory Visit: Payer: Self-pay

## 2019-09-20 ENCOUNTER — Ambulatory Visit: Payer: Medicare HMO | Admitting: Cardiovascular Disease

## 2019-09-20 VITALS — BP 120/64 | HR 78 | Ht 67.0 in | Wt 205.0 lb

## 2019-09-20 DIAGNOSIS — I10 Essential (primary) hypertension: Secondary | ICD-10-CM

## 2019-09-20 DIAGNOSIS — E118 Type 2 diabetes mellitus with unspecified complications: Secondary | ICD-10-CM | POA: Diagnosis not present

## 2019-09-20 DIAGNOSIS — R079 Chest pain, unspecified: Secondary | ICD-10-CM | POA: Diagnosis not present

## 2019-09-20 DIAGNOSIS — R0602 Shortness of breath: Secondary | ICD-10-CM

## 2019-09-20 DIAGNOSIS — E781 Pure hyperglyceridemia: Secondary | ICD-10-CM | POA: Diagnosis not present

## 2019-09-20 NOTE — Patient Instructions (Addendum)
Medication Instructions:  No changes  If you need a refill on your cardiac medications before your next appointment, please call your pharmacy.    Lab work: No new labs needed   If you have labs (blood work) drawn today and your tests are completely normal, you will receive your results only by: Marland Kitchen MyChart Message (if you have MyChart) OR . A paper copy in the mail If you have any lab test that is abnormal or we need to change your treatment, we will call you to review the results.   Testing/Procedures: We will order a lexiscan myoview for shortness of breath,chest pain This Friday [place]  Your caregiver has ordered a Stress Test with nuclear imaging. The purpose of this test is to evaluate the blood supply to your heart muscle. This procedure is referred to as a "Non-Invasive Stress Test." This is because other than having an IV started in your vein, nothing is inserted or "invades" your body. Cardiac stress tests are done to find areas of poor blood flow to the heart by determining the extent of coronary artery disease (CAD). Some patients exercise on a treadmill, which naturally increases the blood flow to your heart, while others who are  unable to walk on a treadmill due to physical limitations have a pharmacologic/chemical stress agent called Lexiscan . This medicine will mimic walking on a treadmill by temporarily increasing your coronary blood flow.   Please note: these test may take anywhere between 2-4 hours to complete  PLEASE REPORT TO [place] AT THE FIRST DESK WILL DIRECT YOU WHERE TO GO  Date of Procedure:_____________________________________  Arrival Time for Procedure:______________________________  Instructions regarding medication:   __XX__ : Hold diabetes medication morning of procedure   PLEASE NOTIFY THE OFFICE AT LEAST 24 HOURS IN ADVANCE IF YOU ARE UNABLE TO KEEP YOUR APPOINTMENT.  [phone] AND  PLEASE NOTIFY  NUCLEAR MEDICINE AT [facility] AT LEAST 24 HOURS IN ADVANCE IF YOU ARE UNABLE TO KEEP YOUR APPOINTMENT. [phone]  How to prepare for your Myoview test:  1. Do not eat or drink after midnight 2. No caffeine for 24 hours prior to test 3. No smoking 24 hours prior to test. 4. Your medication may be taken with water.  If your doctor stopped a medication because of this test, do not take that medication. 5. Ladies, please do not wear dresses.  Skirts or pants are appropriate. Please wear a short sleeve shirt. 6. No perfume, cologne or lotion. 7. Wear comfortable walking shoes. No heels!   Follow-Up: At [facility], you and your health needs are our priority.  As part of our continuing mission to provide you with exceptional heart care, we have created designated Provider Care Teams.  These Care Teams include your primary Cardiologist (physician) and Advanced Practice Providers (APPs -  Physician Assistants and Nurse Practitioners) who all work together to provide you with the care you need, when you need it.  . You will need a follow up appointment as needed   . Providers on your designated Care Team:   . Murray Hodgkins, NP . Christell Faith, PA-C . Marrianne Mood, PA-C  Any Other Special Instructions Will Be Listed Below (If Applicable).  For educational health videos Log in to : [email or web address] Or : [email or web address], password : [email or web address]

## 2019-09-20 NOTE — Progress Notes (Signed)
Cardiology Office Note  Date:  09/20/2019   ID:  Jeffrey, [name] 1956-04-30, MRN [id number]  PCP:  Jerrol Banana., MD   Chief Complaint  Patient presents with  . New Patient (Initial Visit)    Pt c/o of dizziness early in the morning. Pt is concern with the Metformin causing dizziness. Episode at work SOB, dizziness, and chest discomfort. Meds verbally reviewed w/ pt.    HPI:  [name] is a 64-year gentleman with past medical history of Type 2 diabetes Pancreatitis Former smoker, quit age 47 Coronary artery disease by history Who presents by referral from [name] for dizziness, shortness of breath, vasovagal near syncope  One isolated incident. Suspect most likely vasovagal near syncope with the repetitive heavy lifting  2 weeks ago "swimmy headed" 8 AM, lasted one hour Wasn't getting enough air  Went home from work, layed down, Felt drained covid test later that day  Some orthostasis  Lab work reviewed HBA1c 6.8  CT abdomen January 2018, minimal aortic atherosclerosis Images pulled up and reviewed,  Rare palpitations, 30 to 40 min  EKG personally reviewed by myself on todays visit NSR rate 78 bpm, poor R wave progression   PMH:   has a past medical history of Actinic keratosis, Cholecystitis, Diabetes mellitus ([place]), Diverticulosis, Erectile dysfunction, GERD (gastroesophageal reflux disease), Hyperlipidemia, Hypertension, Myalgia, Obesity, Peyronie's disease, Plantar fasciitis, and Seasonal allergies.  PSH:    Past Surgical History:  Procedure Laterality Date  . ARTHROSCOPIC REPAIR ACL    . HERNIA REPAIR     x 2  . LITHOTRIPSY    . MENISCUS REPAIR      Current Outpatient Medications  Medication Sig Dispense Refill  . albuterol (PROVENTIL HFA;VENTOLIN HFA) 108 (90 Base) MCG/ACT inhaler Inhale 2 puffs into the lungs every 4 (four) hours as needed for wheezing or shortness of breath.    . ALLERGY RELIEF 10 MG tablet TAKE 1  TABLET BY MOUTH  DAILY 90 tablet 3  . amLODipine (NORVASC) 10 MG tablet TAKE 1 TABLET BY MOUTH  DAILY 90 tablet 3  . aspirin 81 MG tablet Take 81 mg by mouth daily.    . blood glucose meter kit and supplies Dispense based on patient and insurance preference. Use up to four times daily as directed. (FOR ICD-10 E10.9, E11.9). 1 each 1  . fenofibrate 160 MG tablet TAKE 1 TABLET BY MOUTH  DAILY 90 tablet 3  . fluticasone (FLONASE) 50 MCG/ACT nasal spray Place 2 sprays into both nostrils daily. (Patient taking differently: Place 2 sprays into both nostrils daily as needed. ) 16 g 12  . glipiZIDE (GLUCOTROL) 10 MG tablet TAKE ONE-HALF TABLET BY  MOUTH DAILY BEFORE  BREAKFAST 45 tablet 3  . glucose blood (ONETOUCH VERIO) test strip Check sugar twice daily, DX E11.9 150 each 3  . losartan (COZAAR) 100 MG tablet Take 1 tablet (100 mg total) by mouth daily. 90 tablet 3  . metFORMIN (GLUCOPHAGE) 1000 MG tablet TAKE 1 TABLET BY MOUTH TWO  TIMES DAILY WITH MEALS 180 tablet 1  . Microlet Lancets MISC USE UP TO 4 TIMES DAILY AS  DIRECTED 200 each 3  . omega-3 acid ethyl esters (LOVAZA) 1 g capsule TAKE 1 CAPSULE BY MOUTH TWO TIMES DAILY 180 capsule 3  . omeprazole (PRILOSEC) 20 MG capsule TAKE 1 CAPSULE BY MOUTH  DAILY 90 capsule 3  . pravastatin (PRAVACHOL) 40 MG tablet TAKE 1 TABLET BY MOUTH  DAILY AT 6 PM.  90 tablet 1  . sulfacetamide (BLEPH-10) 10 % ophthalmic solution Place 2 drops into both eyes 4 (four) times daily. 15 mL 0  . traMADol (ULTRAM) 50 MG tablet Take by mouth every 6 (six) hours as needed.    . TRULICITY 1.5 CB/6.3AG SOPN INJECT THE CONTENTS OF 1  PEN (1.5MG) SUBCUTANEOUSLY  WEEKLY AS DIRECTED 6 mL 3   No current facility-administered medications for this visit.    Allergies:   Codeine   Social History:  The patient  reports that he quit smoking about 35 years ago. His smoking use included cigarettes. He smoked 1.50 packs per day. He has never used smokeless tobacco. He reports that he  does not drink alcohol or use drugs.   Family History:   family history includes ALS in his father; Cancer in his maternal grandmother and paternal grandmother; Club foot in his son; Diabetes in his maternal grandfather, mother, and paternal grandfather; Drug abuse in his sister; Heart attack in his father and paternal grandfather; Heart disease in his brother and brother; Hypertension in his brother, brother, brother, father, and paternal grandfather.    Review of Systems: Review of Systems  Constitutional: Negative.   HENT: Negative.   Respiratory: Negative.   Cardiovascular: Negative.   Gastrointestinal: Negative.   Musculoskeletal: Negative.   Neurological: Negative.   Psychiatric/Behavioral: Negative.   All other systems reviewed and are negative.    PHYSICAL EXAM: VS:  BP 120/64 (BP Location: Right Arm, Patient Position: Sitting, Cuff Size: Normal)   Pulse 78   Ht 5' 7"  (1.702 m)   Wt 205 lb (93 kg)   SpO2 98%   BMI 32.11 kg/m  , BMI Body mass index is 32.11 kg/m. GEN: Well nourished, well developed, in no acute distress HEENT: normal Neck: no JVD, carotid bruits, or masses Cardiac: RRR; no murmurs, rubs, or gallops,no edema  Respiratory:  clear to auscultation bilaterally, normal work of breathing GI: soft, nontender, nondistended, + BS MS: no deformity or atrophy Skin: warm and dry, no rash Neuro:  Strength and sensation are intact Psych: euthymic mood, full affect   Recent Labs: 01/14/2019: ALT 24; BUN 26; Creatinine, Ser 1.50; Hemoglobin 15.0; Platelets 274; Potassium 4.8; Sodium 137; TSH 1.900    Lipid Panel Lab Results  Component Value Date   CHOL 137 01/14/2019   HDL 27 (L) 01/14/2019   LDLCALC 44 01/14/2019   TRIG 330 (H) 01/14/2019      Wt Readings from Last 3 Encounters:  09/20/19 205 lb (93 kg)  08/30/19 200 lb (90.7 kg)  05/24/19 204 lb (92.5 kg)       ASSESSMENT AND PLAN:  Problem List Items Addressed This Visit      Cardiology  Problems   Hypertriglyceridemia   Relevant Orders   EKG 12-Lead    Other Visit Diagnoses    Chest pain of uncertain etiology    -  Primary   SOB (shortness of breath)         Recent episode chest pain/shortness of breath while unloading a truck with some general malaise and dizziness Unable to treadmill secondary to chronic knee pain He is concerned about coronary disease, risk factors include hyperlipidemia, prior smoking, diabetes -Stress test has been ordered  Cholesterol at goal Sugar numbers relatively well controlled, discussed diet Blood pressure stable No changes to medications at this time  Type 2 diabetes, on glipizide and Metformin  Hypertension Suggested he could try to take losartan in the morning amlodipine in the  evening to avoid orthostasis symptoms during the night   Disposition:   F/U as needed We will call him with the results of the stress test   Total encounter time more than 45 minutes  Greater than 50% was spent in counseling and coordination of care with the patient   Signed, Esmond Plants, M.D., Ph.D. [employer], [place]

## 2019-09-22 ENCOUNTER — Other Ambulatory Visit (INDEPENDENT_AMBULATORY_CARE_PROVIDER_SITE_OTHER): Payer: Medicare HMO

## 2019-09-22 DIAGNOSIS — R079 Chest pain, unspecified: Secondary | ICD-10-CM

## 2019-09-22 NOTE — Addendum Note (Signed)
Addended by: Casimer Lanius on: 09/22/2019 11:13 AM   Modules accepted: Orders

## 2019-09-22 NOTE — Addendum Note (Signed)
Addended by: Casimer Lanius on: 09/22/2019 10:31 AM   Modules accepted: Orders

## 2019-09-24 NOTE — Progress Notes (Signed)
Patient: Jeffrey [name] Male    DOB: 23-Jun-1956   64 y.o.   MRN: [id number] Visit Date: 09/27/2019  Today's Provider: [name], MD   Chief Complaint  Patient presents with  . Follow-up  . Diabetes  . Hypertension  . Hyperlipidemia   Subjective:     HPI  Patient is now officially retired but still working part-time as a Geophysicist/field seismologist for [employer] .  He is worried about his wife who is possibly and to have back surgery.  He has a son with kidney failure who needs a kidney transplant and his grandson has aplastic anemia.  Despite all this the patient is doing surprisingly well.  He is taking his medications.  He has no real complaints.  He had his first Covid shot.  Dr. Rockey Situ gave him a checkup recently which he was told he was in good shape from a cardiac standpoint Type 2 diabetes mellitus with hyperglycemia, without long-term current use of insulin ([place]) From 05/24/2019-Controlled. Hb A1C 6.8.  Essential hypertension From 05/24/2019-Controlled. On amlodipine and losartan.  Other hyperlipidemia From 05/24/2019-On pravastatin and fenofibrate. Consider stopping fenofibrate in the future.    Allergies  Allergen Reactions  . Codeine Hives     Current Outpatient Medications:  .  albuterol (PROVENTIL HFA;VENTOLIN HFA) 108 (90 Base) MCG/ACT inhaler, Inhale 2 puffs into the lungs every 4 (four) hours as needed for wheezing or shortness of breath., Disp: , Rfl:  .  ALLERGY RELIEF 10 MG tablet, TAKE 1 TABLET BY MOUTH  DAILY, Disp: 90 tablet, Rfl: 3 .  amLODipine (NORVASC) 10 MG tablet, TAKE 1 TABLET BY MOUTH  DAILY, Disp: 90 tablet, Rfl: 3 .  aspirin 81 MG tablet, Take 81 mg by mouth daily., Disp: , Rfl:  .  blood glucose meter kit and supplies, Dispense based on patient and insurance preference. Use up to four times daily as directed. (FOR ICD-10 E10.9, E11.9)., Disp: 1 each, Rfl: 1 .  fenofibrate 160 MG tablet, TAKE 1 TABLET BY MOUTH  DAILY, Disp: 90 tablet, Rfl: 3 .   fluticasone (FLONASE) 50 MCG/ACT nasal spray, Place 2 sprays into both nostrils daily. (Patient taking differently: Place 2 sprays into both nostrils daily as needed. ), Disp: 16 g, Rfl: 12 .  glipiZIDE (GLUCOTROL) 10 MG tablet, TAKE ONE-HALF TABLET BY  MOUTH DAILY BEFORE  BREAKFAST, Disp: 45 tablet, Rfl: 3 .  glucose blood (ONETOUCH VERIO) test strip, Check sugar twice daily, DX E11.9, Disp: 150 each, Rfl: 3 .  losartan (COZAAR) 100 MG tablet, Take 1 tablet (100 mg total) by mouth daily., Disp: 90 tablet, Rfl: 3 .  metFORMIN (GLUCOPHAGE) 1000 MG tablet, TAKE 1 TABLET BY MOUTH TWO  TIMES DAILY WITH MEALS, Disp: 180 tablet, Rfl: 1 .  Microlet Lancets MISC, USE UP TO 4 TIMES DAILY AS  DIRECTED, Disp: 200 each, Rfl: 3 .  omega-3 acid ethyl esters (LOVAZA) 1 g capsule, TAKE 1 CAPSULE BY MOUTH TWO TIMES DAILY, Disp: 180 capsule, Rfl: 3 .  omeprazole (PRILOSEC) 20 MG capsule, TAKE 1 CAPSULE BY MOUTH  DAILY, Disp: 90 capsule, Rfl: 3 .  pravastatin (PRAVACHOL) 40 MG tablet, TAKE 1 TABLET BY MOUTH  DAILY AT 6 PM., Disp: 90 tablet, Rfl: 1 .  sulfacetamide (BLEPH-10) 10 % ophthalmic solution, Place 2 drops into both eyes 4 (four) times daily., Disp: 15 mL, Rfl: 0 .  TRULICITY 1.5 RA/1.5HI SOPN, INJECT THE CONTENTS OF 1  PEN (1.5MG) SUBCUTANEOUSLY  WEEKLY  AS DIRECTED, Disp: 6 mL, Rfl: 3 .  traMADol (ULTRAM) 50 MG tablet, Take by mouth every 6 (six) hours as needed., Disp: , Rfl:   Review of Systems  Constitutional: Negative for appetite change, chills and fever.  HENT: Negative.   Eyes: Negative.   Respiratory: Negative for chest tightness, shortness of breath and wheezing.   Cardiovascular: Negative for chest pain and palpitations.  Gastrointestinal: Negative for abdominal pain, nausea and vomiting.  Endocrine: Negative.   Allergic/Immunologic: Negative.   Hematological: Negative.   Psychiatric/Behavioral: Negative.     Social History   Tobacco Use  . Smoking status: Former Smoker    Packs/day:  1.50    Types: Cigarettes    Quit date: 08/11/1984    Years since quitting: 35.1  . Smokeless tobacco: Never Used  Substance Use Topics  . Alcohol use: No    Alcohol/week: 0.0 standard drinks      Objective:   BP 128/73 (BP Location: Left Arm, Patient Position: Sitting, Cuff Size: Large)   Pulse 81   Temp (!) 96.8 F (36 C) (Other (Comment))   Resp 18   Ht 5' 7"  (1.702 m)   Wt 206 lb (93.4 kg)   SpO2 97%   BMI 32.26 kg/m  Vitals:   09/27/19 1619  BP: 128/73  Pulse: 81  Resp: 18  Temp: (!) 96.8 F (36 C)  TempSrc: Other (Comment)  SpO2: 97%  Weight: 206 lb (93.4 kg)  Height: 5' 7"  (1.702 m)  Body mass index is 32.26 kg/m.   Physical Exam Vitals reviewed.  Constitutional:      Appearance: Normal appearance.  HENT:     Head: Normocephalic.     Nose: Nose normal.     Mouth/Throat:     Mouth: Mucous membranes are moist.  Eyes:     General: No scleral icterus. Cardiovascular:     Rate and Rhythm: Normal rate and regular rhythm.     Pulses: Normal pulses.     Heart sounds: Normal heart sounds.  Pulmonary:     Effort: Pulmonary effort is normal.     Breath sounds: Normal breath sounds.  Abdominal:     Palpations: Abdomen is soft.  Musculoskeletal:        General: No swelling.  Skin:    General: Skin is warm and dry.  Neurological:     General: No focal deficit present.     Mental Status: He is alert and oriented to person, place, and time. Mental status is at baseline.     Comments: Monofilament exam of the foot is normal  Psychiatric:        Mood and Affect: Mood normal.        Behavior: Behavior normal.        Thought Content: Thought content normal.        Judgment: Judgment normal.      No results found for any visits on 09/27/19.     Assessment & Plan    1. Type 2 diabetes mellitus with hyperglycemia, without long-term current use of insulin ([facility]) A1c 7.1.  This is good control.  Continue to work on weight loss.  Follow-up 4 months.CPE  then. - POCT glycosylated hemoglobin (Hb A1C)  2. Essential hypertension Well-controlled on losartan and amlodipine  3. Other hyperlipidemia On pravastatin and fenofibrate.  I really would like to stop the fenofibrate in the future if possible.  4. Class 1 obesity with serious comorbidity and body mass index (BMI) of 32.0 to  32.9 in adult, unspecified obesity type With diabetes hypertension hyperlipidemia  5. ED (erectile dysfunction) of organic origin Follow clinically.     Richard Cranford Mon, MD  [facility]

## 2019-09-25 ENCOUNTER — Ambulatory Visit: Payer: Medicare HMO | Attending: Family Medicine

## 2019-09-25 DIAGNOSIS — Z23 Encounter for immunization: Secondary | ICD-10-CM | POA: Insufficient documentation

## 2019-09-25 NOTE — Progress Notes (Signed)
   Covid-19 Vaccination Clinic  Name:  Akaash Pettersen    MRN: [id number] DOB: 10-29-1955  09/25/2019  Mr. Coriell was observed post Covid-19 immunization for 15 minutes without incidence. He was provided with Vaccine Information Sheet and instruction to access the V-Safe system.   Mr. Hoganson was instructed to call 911 with any severe reactions post vaccine: Marland Kitchen Difficulty breathing  . Swelling of your face and throat  . A fast heartbeat  . A bad rash all over your body  . Dizziness and weakness    Immunizations Administered    Name Date Dose VIS Date Route   Pfizer COVID-19 Vaccine 09/25/2019 10:54 AM 0.3 mL 07/23/2019 Intramuscular   Manufacturer: [place]   Lot: [id number]   [place]: [id number]

## 2019-09-27 ENCOUNTER — Other Ambulatory Visit: Payer: Self-pay

## 2019-09-27 ENCOUNTER — Encounter: Payer: Self-pay | Admitting: Family Medicine

## 2019-09-27 ENCOUNTER — Ambulatory Visit (INDEPENDENT_AMBULATORY_CARE_PROVIDER_SITE_OTHER): Payer: Medicare HMO | Admitting: Family Medicine

## 2019-09-27 VITALS — BP 128/73 | HR 81 | Temp 96.8°F | Resp 18 | Ht 67.0 in | Wt 206.0 lb

## 2019-09-27 DIAGNOSIS — E7849 Other hyperlipidemia: Secondary | ICD-10-CM

## 2019-09-27 DIAGNOSIS — Z6832 Body mass index (BMI) 32.0-32.9, adult: Secondary | ICD-10-CM | POA: Diagnosis not present

## 2019-09-27 DIAGNOSIS — N529 Male erectile dysfunction, unspecified: Secondary | ICD-10-CM | POA: Diagnosis not present

## 2019-09-27 DIAGNOSIS — E669 Obesity, unspecified: Secondary | ICD-10-CM | POA: Diagnosis not present

## 2019-09-27 DIAGNOSIS — I10 Essential (primary) hypertension: Secondary | ICD-10-CM | POA: Diagnosis not present

## 2019-09-27 DIAGNOSIS — E1165 Type 2 diabetes mellitus with hyperglycemia: Secondary | ICD-10-CM | POA: Diagnosis not present

## 2019-09-27 LAB — POCT GLYCOSYLATED HEMOGLOBIN (HGB A1C)
Est. average glucose Bld gHb Est-mCnc: 157
Hemoglobin A1C: 7.1 % — AB (ref 4.0–5.6)

## 2019-10-01 ENCOUNTER — Other Ambulatory Visit: Payer: Self-pay

## 2019-10-01 ENCOUNTER — Ambulatory Visit
Admission: RE | Admit: 2019-10-01 | Discharge: 2019-10-01 | Disposition: A | Discharge status: Home / Self Care | Payer: PRIVATE HEALTH INSURANCE | Source: Ambulatory Visit | Attending: Cardiovascular Disease | Admitting: Cardiovascular Disease

## 2019-10-01 DIAGNOSIS — R0602 Shortness of breath: Secondary | ICD-10-CM | POA: Diagnosis not present

## 2019-10-01 DIAGNOSIS — R079 Chest pain, unspecified: Secondary | ICD-10-CM | POA: Diagnosis not present

## 2019-10-01 LAB — NM MYOCAR MULTI W/SPECT W/WALL MOTION / EF
LV dias vol: 99 mL (ref 62–150)
LV sys vol: 42 mL
Peak HR: 83 {beats}/min
Rest HR: 73 {beats}/min
SDS: 0
SRS: 8
SSS: 2
TID: 1.13

## 2019-10-01 MED ORDER — TECHNETIUM TC 99M TETROFOSMIN IV KIT
9.9820 | PACK | Freq: Once | INTRAVENOUS | Status: AC | PRN
  Administered 2019-10-01: 08:00:00 9.982 via INTRAVENOUS

## 2019-10-01 MED ORDER — TECHNETIUM TC 99M TETROFOSMIN IV KIT
31.4700 | PACK | Freq: Once | INTRAVENOUS | Status: AC | PRN
  Administered 2019-10-01: 09:00:00 31.47 via INTRAVENOUS

## 2019-10-01 MED ORDER — REGADENOSON 0.4 MG/5ML IV SOLN
0.4000 mg | Freq: Once | INTRAVENOUS | Status: AC
  Administered 2019-10-01: 09:00:00 0.4 mg via INTRAVENOUS

## 2019-10-04 ENCOUNTER — Telehealth: Payer: Self-pay | Admitting: *Deleted

## 2019-10-04 NOTE — Telephone Encounter (Signed)
Reviewed results with patient and he verbalized understanding with no further questions at this time. 

## 2019-10-04 NOTE — Telephone Encounter (Signed)
-----   Message from Minna Merritts, MD sent at 10/03/2019  9:16 PM EST ----- Stress test No ischemia Overall a low risk scan Looks good

## 2019-10-04 NOTE — Telephone Encounter (Signed)
Left voicemail message to call back for results.  

## 2019-10-16 ENCOUNTER — Ambulatory Visit: Payer: Medicare HMO | Attending: Internal Medicine

## 2019-10-16 DIAGNOSIS — Z23 Encounter for immunization: Secondary | ICD-10-CM | POA: Insufficient documentation

## 2019-10-16 NOTE — Progress Notes (Signed)
   Covid-19 Vaccination Clinic  Name:  Jeffrey [name]    MRN: [id number] DOB: [birth year]  10/16/2019  Jeffrey [name] was observed post Covid-19 immunization for 30 minutes based on pre-vaccination screening without incident. He was provided with Vaccine Information Sheet and instruction to access the V-Safe system.   Jeffrey [name] was instructed to call 911 with any severe reactions post vaccine: Marland Kitchen Difficulty breathing  . Swelling of face and throat  . A fast heartbeat  . A bad rash all over body  . Dizziness and weakness   Immunizations Administered    Name Date Dose VIS Date Route   Pfizer COVID-19 Vaccine 10/16/2019 10:40 AM 0.3 mL 07/23/2019 Intramuscular   Manufacturer: [place]   Lot: [id number]   [place]: [id number]

## 2019-11-11 ENCOUNTER — Other Ambulatory Visit: Payer: Self-pay | Admitting: Family Medicine

## 2019-11-11 DIAGNOSIS — E785 Hyperlipidemia, unspecified: Secondary | ICD-10-CM

## 2019-11-11 DIAGNOSIS — E1169 Type 2 diabetes mellitus with other specified complication: Secondary | ICD-10-CM

## 2019-11-11 DIAGNOSIS — E1165 Type 2 diabetes mellitus with hyperglycemia: Secondary | ICD-10-CM

## 2019-11-11 MED ORDER — FLUTICASONE PROPIONATE 50 MCG/ACT NA SUSP: 2.0000 | Freq: Every day | NASAL | 12 refills | Status: DC

## 2019-11-11 MED ORDER — TRULICITY 1.5 MG/0.5ML ~~LOC~~ SOAJ: SUBCUTANEOUS | 3 refills | Status: DC

## 2019-11-11 MED ORDER — METFORMIN HCL 1000 MG PO TABS: ORAL_TABLET | ORAL | 3 refills | Status: DC

## 2019-11-11 MED ORDER — AMLODIPINE BESYLATE 10 MG PO TABS: 10.0000 mg | ORAL_TABLET | Freq: Every day | ORAL | 3 refills | Status: DC

## 2019-11-11 MED ORDER — FENOFIBRATE 160 MG PO TABS: 160.0000 mg | ORAL_TABLET | Freq: Every day | ORAL | 3 refills | Status: DC

## 2019-11-11 MED ORDER — GLIPIZIDE 10 MG PO TABS: ORAL_TABLET | ORAL | 3 refills | Status: DC

## 2019-11-11 MED ORDER — PRAVASTATIN SODIUM 40 MG PO TABS: ORAL_TABLET | ORAL | 3 refills | Status: DC

## 2019-11-11 MED ORDER — LOSARTAN POTASSIUM 100 MG PO TABS: 100.0000 mg | ORAL_TABLET | Freq: Every day | ORAL | 3 refills | Status: DC

## 2019-11-11 MED ORDER — OMEPRAZOLE 20 MG PO CPDR: 20.0000 mg | DELAYED_RELEASE_CAPSULE | Freq: Every day | ORAL | 3 refills | Status: DC

## 2019-11-11 NOTE — Telephone Encounter (Signed)
Please review request. KW 

## 2019-11-11 NOTE — Telephone Encounter (Signed)
[facility] faxed refill request for the following medications:  amLODipine (NORVASC) 10 MG tablet glipiZIDE (GLUCOTROL) 5 MG tablet (Pt's chart shows 10 mg) losartan (COZAAR) 123XX123 MG tablet TRULICITY 1.5 0000000 SOPN metFORMIN (GLUCOPHAGE) 1000 MG tablet pravastatin (PRAVACHOL) 40 MG tablet omeprazole (PRILOSEC) 20 MG capsule fenofibrate 160 MG tablet fluticasone (FLONASE) 50 MCG/ACT nasal spray   Please advise.  Thanks, [employer]

## 2019-11-11 NOTE — Telephone Encounter (Signed)
Rxs sent to [facility] 

## 2019-11-22 ENCOUNTER — Ambulatory Visit (INDEPENDENT_AMBULATORY_CARE_PROVIDER_SITE_OTHER): Payer: Medicare HMO | Admitting: Family Medicine

## 2019-11-22 ENCOUNTER — Encounter: Payer: Self-pay | Admitting: Family Medicine

## 2019-11-22 ENCOUNTER — Other Ambulatory Visit: Payer: Self-pay

## 2019-11-22 VITALS — BP 160/80 | HR 78 | Temp 96.9°F | Wt 207.0 lb

## 2019-11-22 DIAGNOSIS — K644 Residual hemorrhoidal skin tags: Secondary | ICD-10-CM

## 2019-11-22 DIAGNOSIS — N5089 Other specified disorders of the male genital organs: Secondary | ICD-10-CM

## 2019-11-22 NOTE — Progress Notes (Signed)
Established patient visit I,Jeffrey [name],acting as a scribe for [employer], [employer].,have documented all relevant documentation on the behalf of Jeffrey Murders, PA,as directed by  [employer], PA while in the presence of [employer], [place].       Patient: Jeffrey [name]   DOB: February 15, 1956   64 y.o. Male  MRN: [id number] Visit Date: 11/22/2019  Today's healthcare provider: [name], PA  Subjective:    Chief Complaint  Patient presents with  . Mass    testicle   HPI  The patient is a 64 year old male who presents for evaluation of a rectal lump. The patient states he first noticed it yesterday.  He has no pain or rectal bleeding.  He also states he may have a knot or lump on his testicle.  He is uncertain if what he feels is abnormal.  Patient Active Problem List   Diagnosis Date Noted  . Sinusitis 09/08/2015  . Hypertriglyceridemia 07/19/2015  . Pancreatitis 07/16/2015  . ED (erectile dysfunction) of organic origin 04/18/2015  . Hypertension 12/06/2014  . Seasonal allergies 12/06/2014  . Cholecystitis 12/06/2014  . Diverticulosis 12/06/2014  . GERD (gastroesophageal reflux disease) 12/06/2014  . Diabetes mellitus ([place]) 12/06/2014  . Plantar fasciitis 12/06/2014  . Actinic keratosis 12/06/2014  . Peyronie's disease 12/06/2014  . Erectile dysfunction 12/06/2014  . Obesity 12/06/2014  . Hyperlipidemia 12/06/2014  . Myalgia 12/06/2014   Past Medical History:  Diagnosis Date  . Actinic keratosis   . Cholecystitis   . Diabetes mellitus ([place])   . Diverticulosis   . Erectile dysfunction   . GERD (gastroesophageal reflux disease)   . Hyperlipidemia   . Hypertension   . Myalgia   . Obesity   . Peyronie's disease   . Plantar fasciitis   . Seasonal allergies    Past Surgical History:  Procedure Laterality Date  . ARTHROSCOPIC REPAIR ACL    . HERNIA REPAIR     x 2  . LITHOTRIPSY    . MENISCUS REPAIR     Social History   Tobacco Use  .  Smoking status: Former Smoker    Packs/day: 1.50    Types: Cigarettes    Quit date: 08/11/1984    Years since quitting: 35.3  . Smokeless tobacco: Never Used  Substance Use Topics  . Alcohol use: No    Alcohol/week: 0.0 standard drinks  . Drug use: No   Family History  Problem Relation Age of Onset  . Hypertension Father   . Heart attack Father   . ALS Father   . Drug abuse Sister        Secondary to head injury after MVA  . Club foot Son        Bilat  . Hypertension Brother   . Heart disease Brother   . Hypertension Brother   . Heart disease Brother   . Hypertension Brother   . Cancer Maternal Grandmother   . Diabetes Maternal Grandfather   . Cancer Paternal Grandmother   . Hypertension Paternal Grandfather   . Heart attack Paternal Grandfather   . Diabetes Paternal Grandfather   . Diabetes Mother        diet controlled   Allergies  Allergen Reactions  . Codeine Hives       Medications: Outpatient Medications Prior to Visit  Medication Sig  . albuterol (PROVENTIL HFA;VENTOLIN HFA) 108 (90 Base) MCG/ACT inhaler Inhale 2 puffs into the lungs every 4 (four) hours as needed for wheezing or  shortness of breath.  . ALLERGY RELIEF 10 MG tablet TAKE 1 TABLET BY MOUTH  DAILY  . amLODipine (NORVASC) 10 MG tablet Take 1 tablet (10 mg total) by mouth daily.  Marland Kitchen aspirin 81 MG tablet Take 81 mg by mouth daily.  . blood glucose meter kit and supplies Dispense based on patient and insurance preference. Use up to four times daily as directed. (FOR ICD-10 E10.9, E11.9).  . Dulaglutide (TRULICITY) 1.5 KG/2.5KY SOPN INJECT THE CONTENTS OF 1  PEN (1.5MG) SUBCUTANEOUSLY  WEEKLY AS DIRECTED  . fenofibrate 160 MG tablet Take 1 tablet (160 mg total) by mouth daily.  . fluticasone (FLONASE) 50 MCG/ACT nasal spray Place 2 sprays into both nostrils daily.  Marland Kitchen glipiZIDE (GLUCOTROL) 10 MG tablet Take one -half tablet by mouth daily before breakfast  . glucose blood (ONETOUCH VERIO) test strip  Check sugar twice daily, DX E11.9  . losartan (COZAAR) 100 MG tablet Take 1 tablet (100 mg total) by mouth daily.  . metFORMIN (GLUCOPHAGE) 1000 MG tablet TAKE 1 TABLET BY MOUTH TWO  TIMES DAILY WITH MEALS  . Microlet Lancets MISC USE UP TO 4 TIMES DAILY AS  DIRECTED  . omega-3 acid ethyl esters (LOVAZA) 1 g capsule TAKE 1 CAPSULE BY MOUTH TWO TIMES DAILY  . omeprazole (PRILOSEC) 20 MG capsule Take 1 capsule (20 mg total) by mouth daily.  . pravastatin (PRAVACHOL) 40 MG tablet TAKE 1 TABLET BY MOUTH  DAILY AT 6 PM.  . sulfacetamide (BLEPH-10) 10 % ophthalmic solution Place 2 drops into both eyes 4 (four) times daily.  . traMADol (ULTRAM) 50 MG tablet Take by mouth every 6 (six) hours as needed.   No facility-administered medications prior to visit.    Review of Systems  Constitutional: Negative for chills, fatigue and fever.  HENT: Negative for ear pain and sore throat.   Respiratory: Negative for cough and shortness of breath.   Gastrointestinal: Negative for abdominal pain.  Musculoskeletal: Negative for arthralgias.  Neurological: Negative for headaches.        Objective:    BP (!) 160/80 (BP Location: Right Arm, Patient Position: Sitting, Cuff Size: Normal)   Pulse 78   Temp (!) 96.9 F (36.1 C) (Skin)   Wt 207 lb (93.9 kg)   SpO2 96%   BMI 32.42 kg/m    Physical Exam Constitutional:      General: He is not in acute distress.    Appearance: He is well-developed.  HENT:     Head: Normocephalic and atraumatic.     Right Ear: Hearing normal.     Left Ear: Hearing normal.     Nose: Nose normal.  Eyes:     General: Lids are normal. No scleral icterus.       Right eye: No discharge.        Left eye: No discharge.     Conjunctiva/sclera: Conjunctivae normal.  Pulmonary:     Effort: Pulmonary effort is normal. No respiratory distress.  Genitourinary:    Comments: Questionable spermatocele in the left posterior testicle region. No tenderness. Soft lesion without local  lymphadenopathy. Also, has a "BB" size lesion inside the anus at the 9 o'clock position. Not tender and not hard. Suspect scar of an old hemorrhoid skin tag. Musculoskeletal:        General: Normal range of motion.  Skin:    Findings: No lesion or rash.  Neurological:     Mental Status: He is alert and oriented to person, place, and time.  Psychiatric:        Speech: Speech normal.        Behavior: Behavior normal.        Thought Content: Thought content normal.       Assessment & Plan:    1. Scrotal mass Approximate 1 cm soft mass in the left posterior testicle region without local lymphadenopathy. Will get scrotal ultrasound with doppler study. Suspect spermatocele. - US SCROTUM W/DOPPLER; Future  2. Residual hemorrhoidal skin tags No bleeding or tender lump inside of the anus. Probable scarred hemorrhoid skin tag. Recheck prn.    Jeffrey [name], [place] [phone] (phone) [phone] (fax)  [place]

## 2019-11-26 ENCOUNTER — Other Ambulatory Visit: Payer: Self-pay

## 2019-11-26 ENCOUNTER — Ambulatory Visit
Admission: RE | Admit: 2019-11-26 | Discharge: 2019-11-26 | Disposition: A | Discharge status: Home / Self Care | Payer: Medicare HMO | Source: Ambulatory Visit | Attending: Family Medicine | Admitting: Family Medicine

## 2019-11-26 DIAGNOSIS — N5089 Other specified disorders of the male genital organs: Secondary | ICD-10-CM | POA: Insufficient documentation

## 2019-11-26 DIAGNOSIS — N433 Hydrocele, unspecified: Secondary | ICD-10-CM | POA: Diagnosis not present

## 2019-11-26 DIAGNOSIS — N503 Cyst of epididymis: Secondary | ICD-10-CM | POA: Diagnosis not present

## 2019-11-26 IMAGING — US US SCROTUM W/ DOPPLER COMPLETE
1 series · 14 of 25 positions shown · non-contrast
Comparison: None

CLINICAL DATA: LEFT scrotal mass

EXAM:
SCROTAL ULTRASOUND
DOPPLER ULTRASOUND OF THE TESTICLES
TECHNIQUE: Complete ultrasound examination of the testicles, epididymis, and
other scrotal structures was performed. Color and spectral Doppler
ultrasound were also utilized to evaluate blood flow to the
testicles.

[Series 1: us scrotum w/doppler · 14 of 121 slices shown]
[im 1/121]
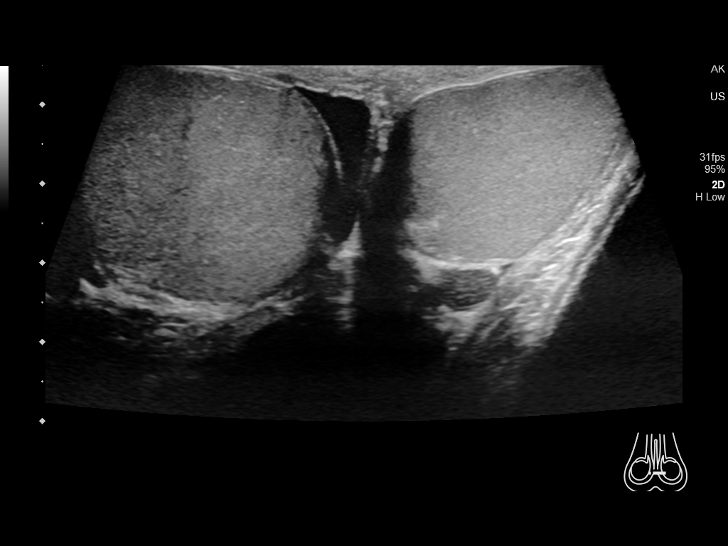
[im 11/121]
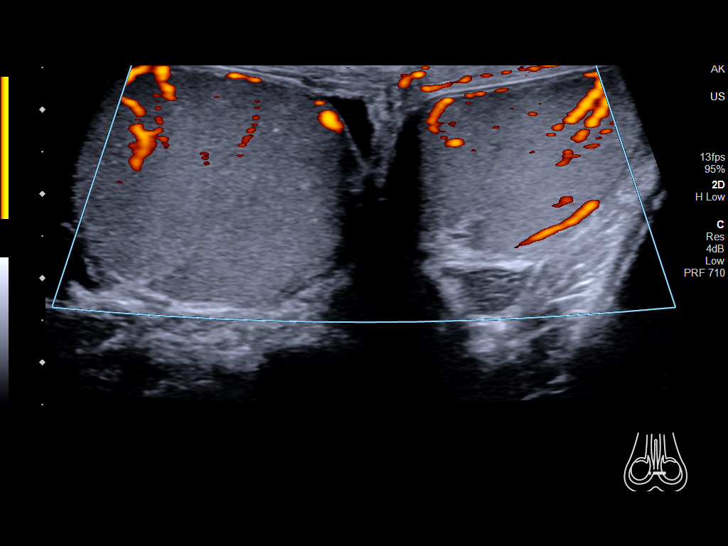
[im 21/121]
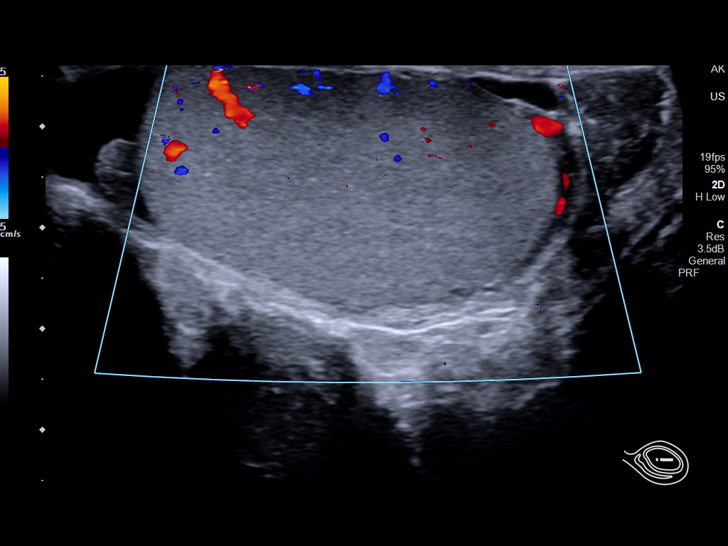
[im 31/121]
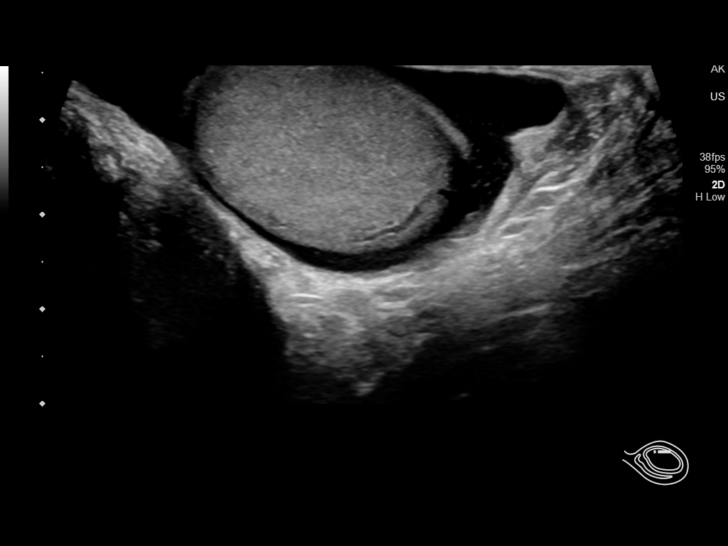
[im 41/121]
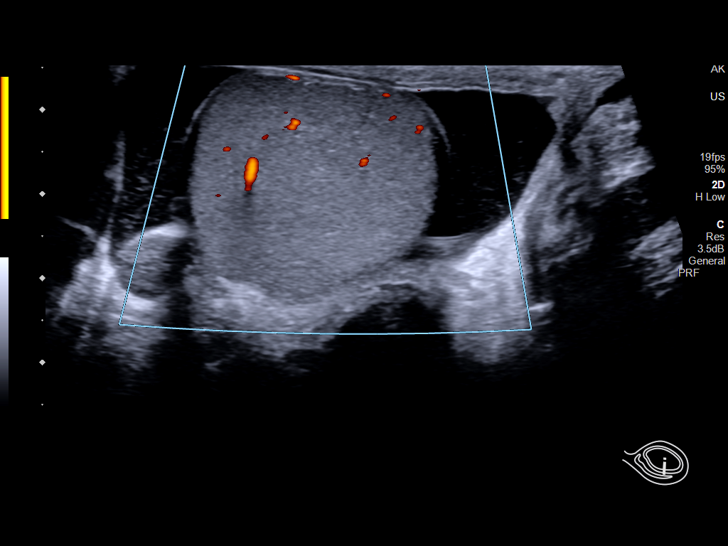
[im 46/121]
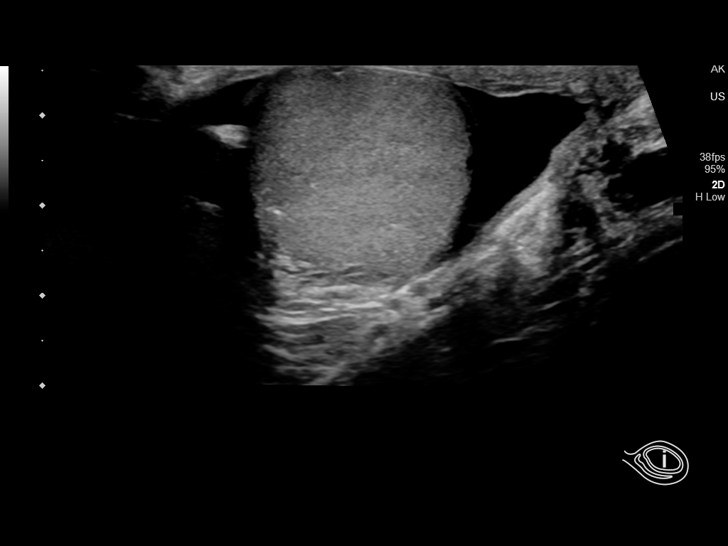
[im 56/121]
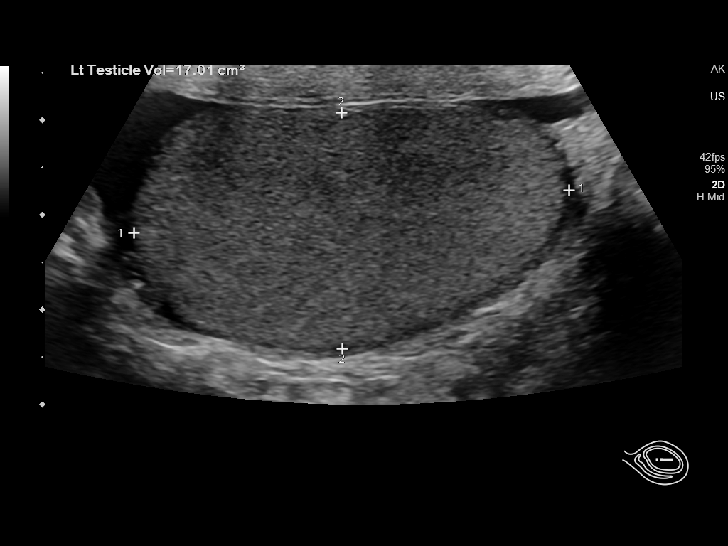
[im 66/121]
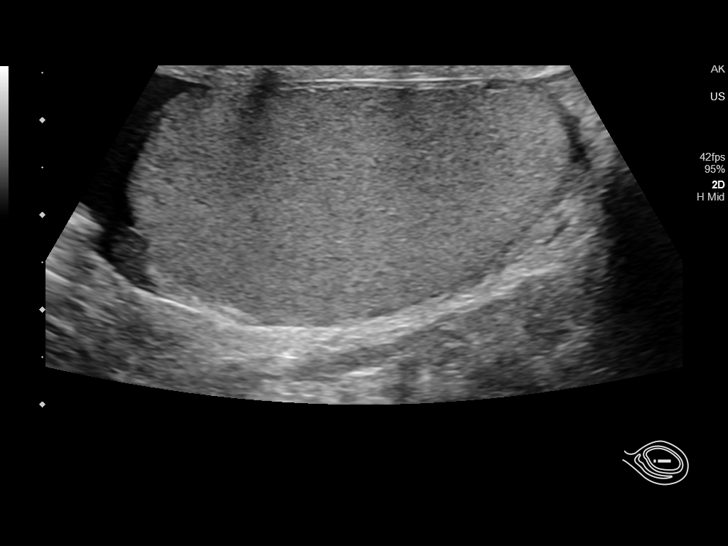
[im 76/121]
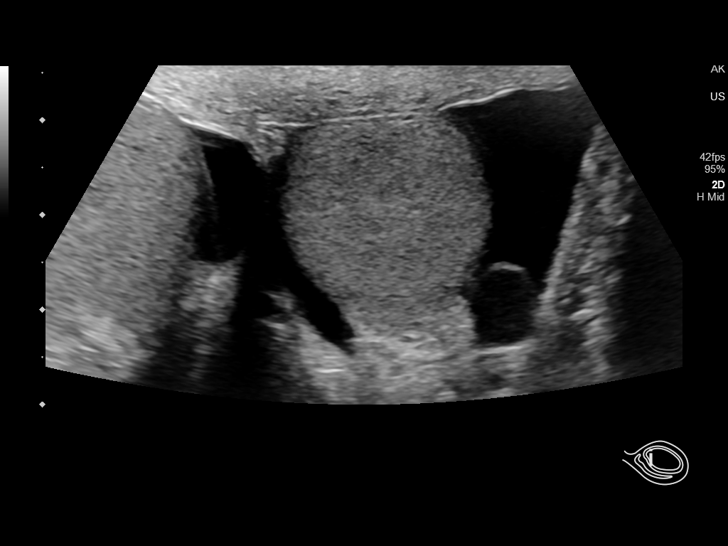
[im 81/121]
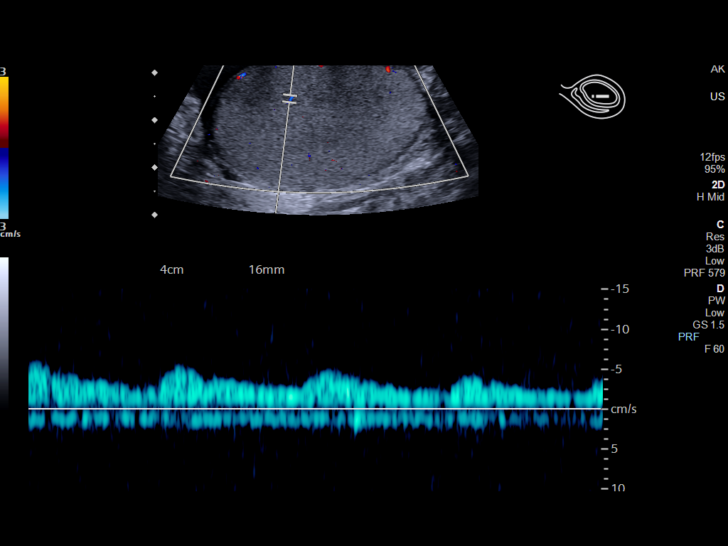
[im 91/121]
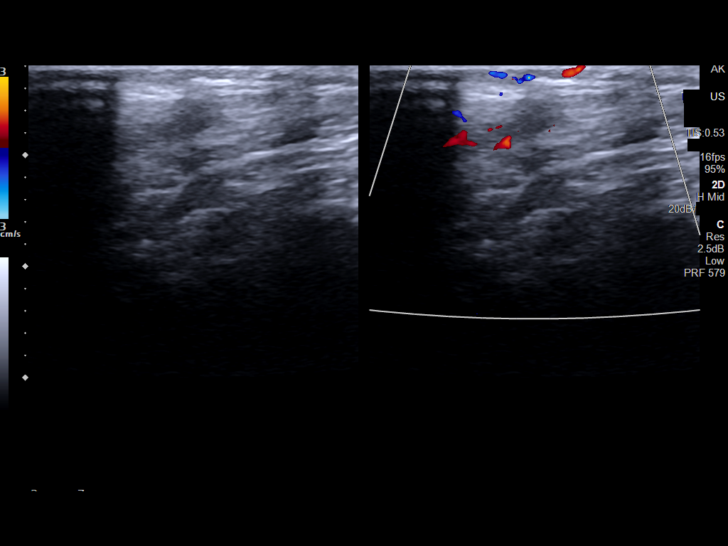
[im 101/121]
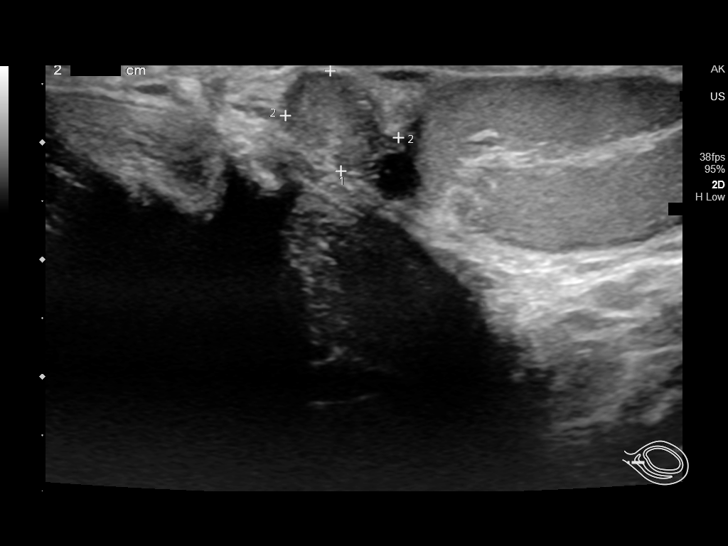
[im 111/121]
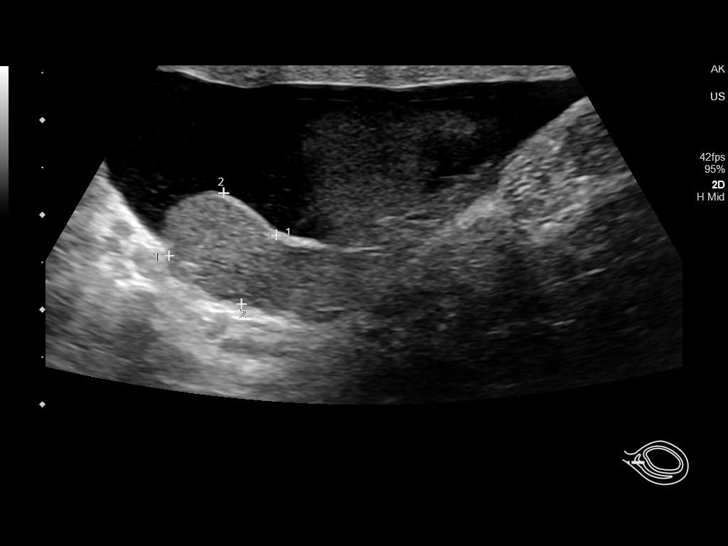
[im 121/121]
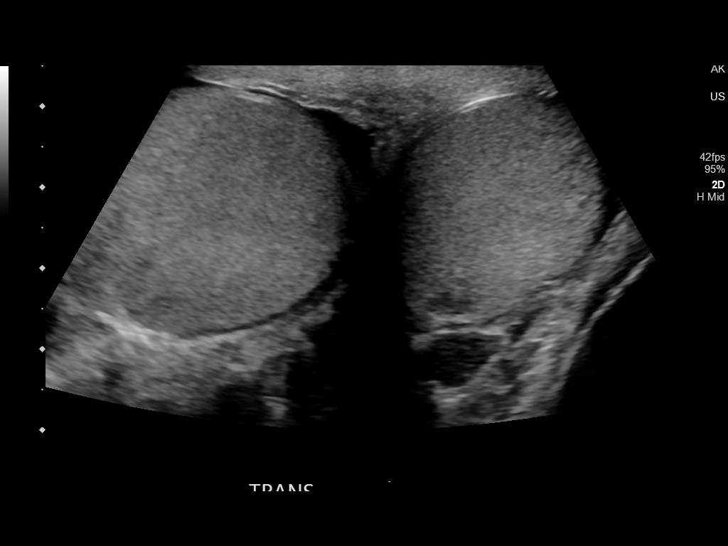

[14 of 25 positions shown; findings below may reference images not displayed]

FINDINGS: Right testicle

Measurements: 4.3 x 2.5 x 3.1 cm. Normal echogenicity without mass
or calcification. Internal blood flow present on color Doppler
imaging.

Left testicle

Measurements: 4.6 x 2.5 x 2.8 cm. Normal echogenicity without mass
or calcification. Internal blood flow present on color Doppler
imaging, symmetric with RIGHT.

Right epididymis: 3 x 3 x 3 mm cyst within RIGHT epididymal head.
Otherwise unremarkable.

Left epididymis:  Normal in size and appearance.

Hydrocele:  BILATERAL hydroceles larger on RIGHT

Varicocele:  None visualized.

Pulsed Doppler interrogation of both testes demonstrates normal low
resistance arterial and venous waveforms bilaterally.
IMPRESSION: BILATERAL hydroceles RIGHT greater than LEFT.

No focal testicular abnormalities.

3 mm cyst at RIGHT epididymal head.

## 2020-01-20 ENCOUNTER — Encounter: Payer: Self-pay | Admitting: Family Medicine

## 2020-01-31 ENCOUNTER — Encounter: Payer: Medicare HMO | Admitting: Family Medicine

## 2020-02-02 NOTE — Progress Notes (Signed)
Medicare Initial Preventative Physical Exam      I,Jeffrey [name],acting as a scribe for Jeffrey Durie, MD.,have documented all relevant documentation on the behalf of Jeffrey Durie, MD,as directed by  Jeffrey Durie, MD while in the presence of Jeffrey Durie, MD.   Patient: Jeffrey [name], Male    DOB: [birth year], 64 y.o.   MRN: [id number] Visit Date: 02/03/2020  Today's Provider: [name], MD   Subjective:    Chief Complaint  Patient presents with  . Medicare Wellness    Medicare Initial Preventative Physical Exam Jeffrey [name] is a 64 y.o. male who presents today for his Initial Preventative Physical Exam.   HPI  Social History   Socioeconomic History  . Marital status: Married    Spouse name: Jeffrey [name]  . Number of children: 3  . Years of education: bachelors  . Highest education level: Not on file  Occupational History    Employer: [employer]  Tobacco Use  . Smoking status: Former Smoker    Packs/day: 1.50    Types: Cigarettes    Quit date: 08/11/1984    Years since quitting: 35.5  . Smokeless tobacco: Never Used  Vaping Use  . Vaping Use: Never used  Substance and Sexual Activity  . Alcohol use: No    Alcohol/week: 0.0 standard drinks  . Drug use: No  . Sexual activity: Not on file  Other Topics Concern  . Not on file  Social History Narrative   Jeffrey [name] has 3 biological children, and 2 adopted children.   Social Determinants of Health   Financial Resource Strain:   . Difficulty of Paying Living Expenses:   Food Insecurity:   . Worried About Charity fundraiser in the Last Year:   . Arboriculturist in the Last Year:   Transportation Needs:   . Film/video editor (Medical):   Marland Kitchen Lack of Transportation (Non-Medical):   Physical Activity:   . Days of Exercise per Week:   . Minutes of Exercise per Session:   Stress:   . Feeling of Stress :   Social Connections:   . Frequency of  Communication with Friends and Family:   . Frequency of Social Gatherings with Friends and Family:   . Attends Religious Services:   . Active Member of Clubs or Organizations:   . Attends Archivist Meetings:   Marland Kitchen Marital Status:   Intimate Partner Violence:   . Fear of Current or Ex-Partner:   . Emotionally Abused:   Marland Kitchen Physically Abused:   . Sexually Abused:     Past Medical History:  Diagnosis Date  . Actinic keratosis   . Cholecystitis   . Diabetes mellitus ([place])   . Diverticulosis   . Erectile dysfunction   . GERD (gastroesophageal reflux disease)   . Hyperlipidemia   . Hypertension   . Myalgia   . Obesity   . Peyronie's disease   . Plantar fasciitis   . Seasonal allergies      Patient Active Problem List   Diagnosis Date Noted  . Sinusitis 09/08/2015  . Hypertriglyceridemia 07/19/2015  . Pancreatitis 07/16/2015  . ED (erectile dysfunction) of organic origin 04/18/2015  . Hypertension 12/06/2014  . Seasonal allergies 12/06/2014  . Cholecystitis 12/06/2014  . Diverticulosis 12/06/2014  . GERD (gastroesophageal reflux disease) 12/06/2014  . Diabetes mellitus ([place]) 12/06/2014  . Plantar fasciitis 12/06/2014  . Actinic keratosis 12/06/2014  . Peyronie's disease 12/06/2014  .  Erectile dysfunction 12/06/2014  . Obesity 12/06/2014  . Hyperlipidemia 12/06/2014  . Myalgia 12/06/2014    Past Surgical History:  Procedure Laterality Date  . ARTHROSCOPIC REPAIR ACL    . HERNIA REPAIR     x 2  . LITHOTRIPSY    . MENISCUS REPAIR      His family history includes ALS in his father; Cancer in his maternal grandmother and paternal grandmother; Club foot in his son; Diabetes in his maternal grandfather, mother, and paternal grandfather; Drug abuse in his sister; Heart attack in his father and paternal grandfather; Heart disease in his brother and brother; Hypertension in his brother, brother, brother, father, and paternal grandfather.   Current Outpatient  Medications:  .  albuterol (PROVENTIL HFA;VENTOLIN HFA) 108 (90 Base) MCG/ACT inhaler, Inhale 2 puffs into the lungs every 4 (four) hours as needed for wheezing or shortness of breath., Disp: , Rfl:  .  ALLERGY RELIEF 10 MG tablet, TAKE 1 TABLET BY MOUTH  DAILY, Disp: 90 tablet, Rfl: 3 .  amLODipine (NORVASC) 10 MG tablet, Take 1 tablet (10 mg total) by mouth daily., Disp: 90 tablet, Rfl: 3 .  aspirin 81 MG tablet, Take 81 mg by mouth daily., Disp: , Rfl:  .  blood glucose meter kit and supplies, Dispense based on patient and insurance preference. Use up to four times daily as directed. (FOR ICD-10 E10.9, E11.9)., Disp: 1 each, Rfl: 1 .  Dulaglutide (TRULICITY) 1.5 RC/7.8LF SOPN, INJECT THE CONTENTS OF 1  PEN (1.5MG) SUBCUTANEOUSLY  WEEKLY AS DIRECTED, Disp: 6 mL, Rfl: 3 .  fenofibrate 160 MG tablet, Take 1 tablet (160 mg total) by mouth daily., Disp: 90 tablet, Rfl: 3 .  fluticasone (FLONASE) 50 MCG/ACT nasal spray, Place 2 sprays into both nostrils daily., Disp: 16 g, Rfl: 12 .  glipiZIDE (GLUCOTROL) 10 MG tablet, Take one -half tablet by mouth daily before breakfast, Disp: 135 tablet, Rfl: 3 .  glucose blood (ONETOUCH VERIO) test strip, Check sugar twice daily, DX E11.9, Disp: 150 each, Rfl: 3 .  losartan (COZAAR) 100 MG tablet, Take 1 tablet (100 mg total) by mouth daily., Disp: 90 tablet, Rfl: 3 .  metFORMIN (GLUCOPHAGE) 1000 MG tablet, TAKE 1 TABLET BY MOUTH TWO  TIMES DAILY WITH MEALS, Disp: 180 tablet, Rfl: 3 .  Microlet Lancets MISC, USE UP TO 4 TIMES DAILY AS  DIRECTED, Disp: 200 each, Rfl: 3 .  omega-3 acid ethyl esters (LOVAZA) 1 g capsule, TAKE 1 CAPSULE BY MOUTH TWO TIMES DAILY, Disp: 180 capsule, Rfl: 3 .  omeprazole (PRILOSEC) 20 MG capsule, Take 1 capsule (20 mg total) by mouth daily., Disp: 90 capsule, Rfl: 3 .  pravastatin (PRAVACHOL) 40 MG tablet, TAKE 1 TABLET BY MOUTH  DAILY AT 6 PM., Disp: 90 tablet, Rfl: 3 .  sulfacetamide (BLEPH-10) 10 % ophthalmic solution, Place 2 drops  into both eyes 4 (four) times daily., Disp: 15 mL, Rfl: 0   Patient Care Team: Jeffrey [name]., MD as PCP - General (Family Medicine)  Review of Systems  Constitutional: Negative.   HENT: Positive for tinnitus.   Eyes: Negative.   Cardiovascular: Positive for palpitations.  Gastrointestinal: Negative.   Musculoskeletal: Positive for neck stiffness.  Allergic/Immunologic: Negative.   Psychiatric/Behavioral: Positive for dysphoric mood.       Grandson being treated for aplastic anemia and has severe heart failure.         Objective:    Vitals: BP 131/71 (BP Location: Right Arm, Patient Position: Sitting,  Cuff Size: Large)   Pulse 70   Temp (!) 97.3 F (36.3 C) (Other (Comment))   Resp 18   Ht 5' 7" (1.702 m)   Wt 203 lb (92.1 kg)   SpO2 97%   BMI 31.79 kg/m   Hearing Screening   125Hz 250Hz 500Hz 1000Hz 2000Hz 3000Hz 4000Hz 6000Hz 8000Hz  Right ear:           Left ear:             Visual Acuity Screening   Right eye Left eye Both eyes  Without correction: 20/25 20/20 20/20  With correction:      Physical Exam Vitals and nursing note reviewed. Exam conducted with a chaperone present.  Constitutional:      Appearance: Normal appearance. He is normal weight.  HENT:     Right Ear: Tympanic membrane normal.     Left Ear: Tympanic membrane normal.     Nose: Nose normal.     Mouth/Throat:     Mouth: Mucous membranes are moist.  Cardiovascular:     Rate and Rhythm: Normal rate and regular rhythm.     Pulses: Normal pulses.     Heart sounds: Normal heart sounds.  Pulmonary:     Effort: Pulmonary effort is normal.     Breath sounds: Normal breath sounds.  Abdominal:     General: Bowel sounds are normal.     Palpations: Abdomen is soft.  Musculoskeletal:        General: Normal range of motion.     Cervical back: Normal range of motion and neck supple.  Skin:    General: Skin is warm.  Neurological:     Mental Status: He is alert.  Psychiatric:         Mood and Affect: Mood normal.        Behavior: Behavior normal.      Activities of Daily Living In your present state of health, do you have any difficulty performing the following activities: 02/03/2020  Hearing? N  Vision? N  Difficulty concentrating or making decisions? N  Walking or climbing stairs? N  Dressing or bathing? N  Doing errands, shopping? N  Some recent data might be hidden    Fall Risk Assessment Fall Risk  02/03/2020 09/27/2019 01/02/2017 11/04/2016 10/28/2016  Falls in the past year? 0 1 No No No  Number falls in past yr: 0 1 - - -  Injury with Fall? 0 0 - - -  Follow up Falls evaluation completed Falls evaluation completed - - -     Depression Screen PHQ 2/9 Scores 02/03/2020 01/14/2019 09/20/2016 04/18/2015  PHQ - 2 Score 0 0 0 0  PHQ- 9 Score 2 - - -    No flowsheet data found.    Assessment & Plan:    Initial Preventative Physical Exam  Reviewed patient's Family Medical History Reviewed and updated list of patient's medical providers Assessment of cognitive impairment was done Assessed patient's functional ability Established a written schedule for health screening [place] Completed and Reviewed  Exercise Activities and Dietary recommendations Goals   None     Immunization History  Administered Date(s) Administered  . Influenza Inj Mdck Quad Pf 05/27/2018  . Influenza Split 04/22/2012  . Influenza,inj,Quad PF,6+ Mos 05/04/2013, 04/28/2014, 04/20/2015, 05/05/2017, 05/24/2019  . PFIZER SARS-COV-2 Vaccination 09/25/2019, 10/16/2019  . Pneumococcal Polysaccharide-23 06/28/2010  . Td 01/14/2019  . Tdap 10/30/2007  . Zoster 03/12/2016    Health Maintenance  Topic  Date Due  . FOOT EXAM  Never done  . HIV Screening  Never done  . PNA vac Low Risk Adult (1 of 2 - PCV13) 08/28/2019  . INFLUENZA VACCINE  03/12/2020  . HEMOGLOBIN A1C  03/26/2020  . OPHTHALMOLOGY EXAM  06/15/2020  . COLONOSCOPY  10/02/2020  . TETANUS/TDAP   01/13/2029  . COVID-19 Vaccine  Completed  . Hepatitis C Screening  Completed     Discussed health benefits of physical activity, and encouraged him to engage in regular exercise appropriate for his age and condition.   1. Welcome to Medicare preventive visit Follow-up 1 to 2 months. - EKG 12-Lead  2. Need for pneumococcal vaccination Pneumovax updated. - Pneumococcal polysaccharide vaccine 23-valent greater than or equal to 2yo subcutaneous/IM      Jeffrey Durie, MD  [facility] [phone] (phone) [phone] (fax)  [place]

## 2020-02-03 ENCOUNTER — Other Ambulatory Visit: Payer: Self-pay

## 2020-02-03 ENCOUNTER — Encounter: Payer: Self-pay | Admitting: Family Medicine

## 2020-02-03 ENCOUNTER — Ambulatory Visit (INDEPENDENT_AMBULATORY_CARE_PROVIDER_SITE_OTHER): Payer: Medicare HMO | Admitting: Family Medicine

## 2020-02-03 VITALS — BP 131/71 | HR 70 | Temp 97.3°F | Resp 18 | Ht 67.0 in | Wt 203.0 lb

## 2020-02-03 DIAGNOSIS — Z23 Encounter for immunization: Secondary | ICD-10-CM

## 2020-02-03 DIAGNOSIS — Z0000 Encounter for general adult medical examination without abnormal findings: Secondary | ICD-10-CM | POA: Diagnosis not present

## 2020-02-25 NOTE — Progress Notes (Signed)
Established patient visit  I,Jeffrey [name],acting as a scribe for Jeffrey Durie, MD.,have documented all relevant documentation on the behalf of Jeffrey Durie, MD,as directed by  Jeffrey Durie, MD while in the presence of Jeffrey Durie, MD.   Patient: Jeffrey [name]   DOB: March 29, 1956   64 y.o. Male  MRN: [id number] Visit Date: 02/29/2020  Today's healthcare provider: [name], MD   Chief Complaint  Patient presents with  . Diabetes  . Follow-up  . Hyperlipidemia  . Hypertension   Subjective    HPI  Patient is here for labs, had welcome to Medicare visit 02/03/2020. His 69-year-old grandson died 2 weeks ago and he and his family are taking it pretty hard.     Medications: Outpatient Medications Prior to Visit  Medication Sig  . albuterol (PROVENTIL HFA;VENTOLIN HFA) 108 (90 Base) MCG/ACT inhaler Inhale 2 puffs into the lungs every 4 (four) hours as needed for wheezing or shortness of breath.  . ALLERGY RELIEF 10 MG tablet TAKE 1 TABLET BY MOUTH  DAILY  . amLODipine (NORVASC) 10 MG tablet Take 1 tablet (10 mg total) by mouth daily.  Marland Kitchen aspirin 81 MG tablet Take 81 mg by mouth daily.  . blood glucose meter kit and supplies Dispense based on patient and insurance preference. Use up to four times daily as directed. (FOR ICD-10 E10.9, E11.9).  . Dulaglutide (TRULICITY) 1.5 MH/6.8GS SOPN INJECT THE CONTENTS OF 1  PEN (1.5MG) SUBCUTANEOUSLY  WEEKLY AS DIRECTED  . fenofibrate 160 MG tablet Take 1 tablet (160 mg total) by mouth daily.  . fluticasone (FLONASE) 50 MCG/ACT nasal spray Place 2 sprays into both nostrils daily.  Marland Kitchen glipiZIDE (GLUCOTROL) 10 MG tablet Take one -half tablet by mouth daily before breakfast  . glucose blood (ONETOUCH VERIO) test strip Check sugar twice daily, DX E11.9  . losartan (COZAAR) 100 MG tablet Take 1 tablet (100 mg total) by mouth daily.  . metFORMIN (GLUCOPHAGE) 1000 MG tablet TAKE 1 TABLET BY MOUTH TWO  TIMES DAILY WITH  MEALS  . Microlet Lancets MISC USE UP TO 4 TIMES DAILY AS  DIRECTED  . omega-3 acid ethyl esters (LOVAZA) 1 g capsule TAKE 1 CAPSULE BY MOUTH TWO TIMES DAILY  . omeprazole (PRILOSEC) 20 MG capsule Take 1 capsule (20 mg total) by mouth daily.  . pravastatin (PRAVACHOL) 40 MG tablet TAKE 1 TABLET BY MOUTH  DAILY AT 6 PM.  . sulfacetamide (BLEPH-10) 10 % ophthalmic solution Place 2 drops into both eyes 4 (four) times daily.   No facility-administered medications prior to visit.    Review of Systems  Constitutional: Negative for appetite change, chills and fever.  Respiratory: Negative for chest tightness, shortness of breath and wheezing.   Cardiovascular: Negative for chest pain and palpitations.  Gastrointestinal: Negative for abdominal pain, nausea and vomiting.  Endocrine: Negative.   Allergic/Immunologic: Negative.   Neurological: Negative.   Psychiatric/Behavioral: Negative.     Last hemoglobin A1c Lab Results  Component Value Date   HGBA1C 7.4 (A) 02/29/2020      Objective    BP 121/75 (BP Location: Left Arm, Patient Position: Sitting, Cuff Size: Large)   Pulse 91   Temp (!) 97.3 F (36.3 C) (Other (Comment))   Resp 16   Ht _0  (1.702 m)   Wt 202 lb (91.6 kg)   SpO2 97%   BMI 31.64 kg/m  BP Readings from Last 3 Encounters:  02/29/20 121/75  02/03/20 131/71  11/22/19 (!) 160/80   Wt Readings from Last 3 Encounters:  02/29/20 202 lb (91.6 kg)  02/03/20 203 lb (92.1 kg)  11/22/19 207 lb (93.9 kg)      Physical Exam Vitals reviewed.  Constitutional:      Appearance: Normal appearance.  HENT:     Head: Normocephalic.     Nose: Nose normal.     Mouth/Throat:     Mouth: Mucous membranes are moist.  Eyes:     General: No scleral icterus. Cardiovascular:     Rate and Rhythm: Normal rate and regular rhythm.     Pulses: Normal pulses.     Heart sounds: Normal heart sounds.  Pulmonary:     Effort: Pulmonary effort is normal.     Breath sounds: Normal  breath sounds.  Abdominal:     Palpations: Abdomen is soft.  Musculoskeletal:        General: No swelling.  Skin:    General: Skin is warm and dry.  Neurological:     General: No focal deficit present.     Mental Status: He is alert. Mental status is at baseline.  Psychiatric:        Mood and Affect: Mood normal.        Behavior: Behavior normal.        Thought Content: Thought content normal.        Judgment: Judgment normal.       Results for orders placed or performed in visit on 02/29/20  POCT glycosylated hemoglobin (Hb A1C)  Result Value Ref Range   Hemoglobin A1C 7.4 (A) 4.0 - 5.6 %   Est. average glucose Bld gHb Est-mCnc 166     Assessment & Plan     1. Type 2 diabetes mellitus with hyperglycemia, without long-term current use of insulin (HCC) A1c 7.4 today.  No changes in care. - CBC w/Diff/Platelet - Comprehensive Metabolic Panel (CMET) - Lipid panel - TSH - POCT glycosylated hemoglobin (Hb A1C)  2. Essential hypertension  - CBC w/Diff/Platelet - Comprehensive Metabolic Panel (CMET) - Lipid panel - TSH  3. Other hyperlipidemia  - CBC w/Diff/Platelet - Comprehensive Metabolic Panel (CMET) - Lipid panel - TSH  4. Prostate cancer screening  - PSA  5. Class 1 obesity with serious comorbidity and body mass index (BMI) of 32.0 to 32.9 in adult, unspecified obesity type  6.  Grieving The death of a 36-year-old grandson.   No follow-ups on file.         Letroy Vazguez Cranford Mon, MD  [facility] [phone] (phone) [phone] (fax)  [place]

## 2020-02-29 ENCOUNTER — Encounter: Payer: Self-pay | Admitting: Family Medicine

## 2020-02-29 ENCOUNTER — Other Ambulatory Visit: Payer: Self-pay

## 2020-02-29 ENCOUNTER — Ambulatory Visit (INDEPENDENT_AMBULATORY_CARE_PROVIDER_SITE_OTHER): Payer: Medicare HMO | Admitting: Family Medicine

## 2020-02-29 VITALS — BP 121/75 | HR 91 | Temp 97.3°F | Resp 16 | Ht 67.0 in | Wt 202.0 lb

## 2020-02-29 DIAGNOSIS — E1165 Type 2 diabetes mellitus with hyperglycemia: Secondary | ICD-10-CM | POA: Diagnosis not present

## 2020-02-29 DIAGNOSIS — I10 Essential (primary) hypertension: Secondary | ICD-10-CM | POA: Diagnosis not present

## 2020-02-29 DIAGNOSIS — Z6832 Body mass index (BMI) 32.0-32.9, adult: Secondary | ICD-10-CM | POA: Diagnosis not present

## 2020-02-29 DIAGNOSIS — E669 Obesity, unspecified: Secondary | ICD-10-CM

## 2020-02-29 DIAGNOSIS — Z125 Encounter for screening for malignant neoplasm of prostate: Secondary | ICD-10-CM

## 2020-02-29 DIAGNOSIS — E7849 Other hyperlipidemia: Secondary | ICD-10-CM | POA: Diagnosis not present

## 2020-02-29 LAB — POCT GLYCOSYLATED HEMOGLOBIN (HGB A1C)
Est. average glucose Bld gHb Est-mCnc: 166
Hemoglobin A1C: 7.4 % — AB (ref 4.0–5.6)

## 2020-02-29 NOTE — Patient Instructions (Signed)
INCREASE GLIPIZIDE TO 10 MG !!!

## 2020-03-01 LAB — COMPREHENSIVE METABOLIC PANEL
ALT: 29 IU/L (ref 0–44)
AST: 22 IU/L (ref 0–40)
Albumin/Globulin Ratio: 1.8 (ref 1.2–2.2)
Albumin: 4.7 g/dL (ref 3.8–4.8)
Alkaline Phosphatase: 48 IU/L (ref 48–121)
BUN/Creatinine Ratio: 17 (ref 10–24)
BUN: 23 mg/dL (ref 8–27)
Bilirubin Total: 0.4 mg/dL (ref 0.0–1.2)
CO2: 20 mmol/L (ref 20–29)
Calcium: 10.2 mg/dL (ref 8.6–10.2)
Chloride: 101 mmol/L (ref 96–106)
Creatinine, Ser: 1.32 mg/dL — ABNORMAL HIGH (ref 0.76–1.27)
GFR calc Af Amer: 65 mL/min/{1.73_m2} (ref >59)
GFR calc non Af Amer: 56 mL/min/{1.73_m2} — ABNORMAL LOW (ref >59)
Globulin, Total: 2.6 g/dL (ref 1.5–4.5)
Glucose: 139 mg/dL — ABNORMAL HIGH (ref 65–99)
Potassium: 4.7 mmol/L (ref 3.5–5.2)
Sodium: 140 mmol/L (ref 134–144)
Total Protein: 7.3 g/dL (ref 6.0–8.5)

## 2020-03-01 LAB — LIPID PANEL
Chol/HDL Ratio: 4.7 ratio (ref 0.0–5.0)
Cholesterol, Total: 128 mg/dL (ref 100–199)
HDL: 27 mg/dL — ABNORMAL LOW (ref >39)
LDL Chol Calc (NIH): 43 mg/dL (ref 0–99)
Triglycerides: 395 mg/dL — ABNORMAL HIGH (ref 0–149)
VLDL Cholesterol Cal: 58 mg/dL — ABNORMAL HIGH (ref 5–40)

## 2020-03-01 LAB — CBC WITH DIFFERENTIAL/PLATELET
Basophils Absolute: 0.1 10*3/uL (ref 0.0–0.2)
Basos: 1 %
EOS (ABSOLUTE): 0.2 10*3/uL (ref 0.0–0.4)
Eos: 2 %
Hematocrit: 43.7 % (ref 37.5–51.0)
Hemoglobin: 14.4 g/dL (ref 13.0–17.7)
Immature Grans (Abs): 0.1 10*3/uL (ref 0.0–0.1)
Immature Granulocytes: 1 %
Lymphocytes Absolute: 2.5 10*3/uL (ref 0.7–3.1)
Lymphs: 28 %
MCH: 27.9 pg (ref 26.6–33.0)
MCHC: 33 g/dL (ref 31.5–35.7)
MCV: 85 fL (ref 79–97)
Monocytes Absolute: 0.7 10*3/uL (ref 0.1–0.9)
Monocytes: 8 %
Neutrophils Absolute: 5.5 10*3/uL (ref 1.4–7.0)
Neutrophils: 60 %
Platelets: 274 10*3/uL (ref 150–450)
RBC: 5.17 x10E6/uL (ref 4.14–5.80)
RDW: 13.7 % (ref 11.6–15.4)
WBC: 9.1 10*3/uL (ref 3.4–10.8)

## 2020-03-01 LAB — TSH: TSH: 1.47 u[IU]/mL (ref 0.450–4.500)

## 2020-03-01 LAB — PSA: Prostate Specific Ag, Serum: 1.2 ng/mL (ref 0.0–4.0)

## 2020-03-16 ENCOUNTER — Ambulatory Visit: Payer: Self-pay | Admitting: Family Medicine

## 2020-06-06 ENCOUNTER — Other Ambulatory Visit: Payer: Self-pay | Admitting: Family Medicine

## 2020-06-06 DIAGNOSIS — E1169 Type 2 diabetes mellitus with other specified complication: Secondary | ICD-10-CM

## 2020-06-06 MED ORDER — GLIPIZIDE 10 MG PO TABS: ORAL_TABLET | ORAL | 3 refills | Status: DC

## 2020-06-06 NOTE — Telephone Encounter (Signed)
[facility] faxed refill request for the following medications:  glipiZIDE (GLUCOTROL) 10 MG tablet   Please advise.

## 2020-06-13 DIAGNOSIS — E119 Type 2 diabetes mellitus without complications: Secondary | ICD-10-CM | POA: Diagnosis not present

## 2020-06-13 LAB — HM DIABETES EYE EXAM

## 2020-06-15 ENCOUNTER — Encounter: Payer: Self-pay | Admitting: *Deleted

## 2020-07-13 ENCOUNTER — Encounter: Payer: Self-pay | Admitting: Family Medicine

## 2020-07-13 ENCOUNTER — Other Ambulatory Visit: Payer: Self-pay

## 2020-07-13 ENCOUNTER — Ambulatory Visit (INDEPENDENT_AMBULATORY_CARE_PROVIDER_SITE_OTHER): Payer: Medicare HMO | Admitting: Family Medicine

## 2020-07-13 DIAGNOSIS — I10 Essential (primary) hypertension: Secondary | ICD-10-CM | POA: Diagnosis not present

## 2020-07-13 DIAGNOSIS — E7849 Other hyperlipidemia: Secondary | ICD-10-CM | POA: Diagnosis not present

## 2020-07-13 DIAGNOSIS — Z23 Encounter for immunization: Secondary | ICD-10-CM | POA: Diagnosis not present

## 2020-07-13 DIAGNOSIS — E1165 Type 2 diabetes mellitus with hyperglycemia: Secondary | ICD-10-CM

## 2020-07-13 DIAGNOSIS — E669 Obesity, unspecified: Secondary | ICD-10-CM | POA: Diagnosis not present

## 2020-07-13 DIAGNOSIS — Z6832 Body mass index (BMI) 32.0-32.9, adult: Secondary | ICD-10-CM | POA: Diagnosis not present

## 2020-07-13 LAB — POCT UA - MICROALBUMIN: Microalbumin Ur, POC: 50 mg/L

## 2020-07-13 NOTE — Progress Notes (Signed)
I,[name],acting as a scribe for [name], MD.,have documented all relevant documentation on the behalf of [name], MD,as directed by  [name], MD while in the presence of [name], MD.   Established patient visit   Patient: Jeffrey [name]   DOB: June 05, 1956   64 y.o. Male  MRN: [id number] Visit Date: 07/13/2020  Today's healthcare provider: [name], MD   Jeffrey Complaint  Patient presents with  . Diabetes  . Follow-up  . Hypertension   Subjective    HPI  Patient retired in January 2021 Is doing fairly well.  He is working part-time about 3 days a week. He asked about Covid booster so we will do that today. Diabetes Mellitus Type II, follow-up  Lab Results  Component Value Date   HGBA1C 7.4 (A) 02/29/2020   HGBA1C 7.1 (A) 09/27/2019   HGBA1C 6.8 (A) 05/24/2019   Last seen for diabetes 4 months ago.  Management since then includes continuing the same treatment. He reports good compliance with treatment. He is not having side effects. none  Home blood sugar records: fasting range: 143/79  Episodes of hypoglycemia? No none   Current insulin regiment: n/a Most Recent Eye Exam: 06/13/2020  --------------------------------------------------------------------  Hypertension, follow-up  BP Readings from Last 3 Encounters:  02/29/20 121/75  02/03/20 131/71  11/22/19 (!) 160/80   Wt Readings from Last 3 Encounters:  02/29/20 202 lb (91.6 kg)  02/03/20 203 lb (92.1 kg)  11/22/19 207 lb (93.9 kg)     He was last seen for hypertension 4 months ago.  BP at that visit was 121/75. Management since that visit includes; on amlodipine and losartan. He reports good compliance with treatment. He is not having side effects. none He is not exercising. He is adherent to low salt diet.   Outside blood pressures are not checking.  He does not smoke.  Use of agents associated with hypertension: none.    --------------------------------------------------------------------       Medications: Outpatient Medications Prior to Visit  Medication Sig  . albuterol (PROVENTIL HFA;VENTOLIN HFA) 108 (90 Base) MCG/ACT inhaler Inhale 2 puffs into the lungs every 4 (four) hours as needed for wheezing or shortness of breath.  . ALLERGY RELIEF 10 MG tablet TAKE 1 TABLET BY MOUTH  DAILY  . amLODipine (NORVASC) 10 MG tablet Take 1 tablet (10 mg total) by mouth daily.  Marland Kitchen aspirin 81 MG tablet Take 81 mg by mouth daily.  . blood glucose meter kit and supplies Dispense based on patient and insurance preference. Use up to four times daily as directed. (FOR ICD-10 E10.9, E11.9).  . Dulaglutide (TRULICITY) 1.5 SF/6.8LE SOPN INJECT THE CONTENTS OF 1  PEN (1.5MG) SUBCUTANEOUSLY  WEEKLY AS DIRECTED  . fenofibrate 160 MG tablet Take 1 tablet (160 mg total) by mouth daily.  . fluticasone (FLONASE) 50 MCG/ACT nasal spray Place 2 sprays into both nostrils daily.  Marland Kitchen glipiZIDE (GLUCOTROL) 10 MG tablet Take one -half tablet by mouth daily before breakfast  . glucose blood (ONETOUCH VERIO) test strip Check sugar twice daily, DX E11.9  . losartan (COZAAR) 100 MG tablet Take 1 tablet (100 mg total) by mouth daily.  . metFORMIN (GLUCOPHAGE) 1000 MG tablet TAKE 1 TABLET BY MOUTH TWO  TIMES DAILY WITH MEALS  . Microlet Lancets MISC USE UP TO 4 TIMES DAILY AS  DIRECTED  . omega-3 acid ethyl esters (LOVAZA) 1 g capsule TAKE 1 CAPSULE BY MOUTH TWO TIMES DAILY  .  omeprazole (PRILOSEC) 20 MG capsule Take 1 capsule (20 mg total) by mouth daily.  . pravastatin (PRAVACHOL) 40 MG tablet TAKE 1 TABLET BY MOUTH  DAILY AT 6 PM.  . sulfacetamide (BLEPH-10) 10 % ophthalmic solution Place 2 drops into both eyes 4 (four) times daily.   No facility-administered medications prior to visit.    Review of Systems  Constitutional: Negative for appetite change, chills and fever.  Respiratory: Negative for chest tightness, shortness of breath  and wheezing.   Cardiovascular: Negative for chest pain and palpitations.  Gastrointestinal: Negative for abdominal pain, nausea and vomiting.    Last hemoglobin A1c Lab Results  Component Value Date   HGBA1C 7.3 (H) 07/13/2020      Objective    There were no vitals taken for this visit. BP Readings from Last 3 Encounters:  02/29/20 121/75  02/03/20 131/71  11/22/19 (!) 160/80   Wt Readings from Last 3 Encounters:  02/29/20 202 lb (91.6 kg)  02/03/20 203 lb (92.1 kg)  11/22/19 207 lb (93.9 kg)      Physical Exam Vitals reviewed.  Constitutional:      Appearance: Normal appearance.  HENT:     Head: Normocephalic.     Nose: Nose normal.     Mouth/Throat:     Mouth: Mucous membranes are moist.  Eyes:     General: No scleral icterus. Cardiovascular:     Rate and Rhythm: Normal rate and regular rhythm.     Pulses: Normal pulses.     Heart sounds: Normal heart sounds.  Pulmonary:     Effort: Pulmonary effort is normal.     Breath sounds: Normal breath sounds.  Abdominal:     Palpations: Abdomen is soft.  Musculoskeletal:        General: No swelling.  Skin:    General: Skin is warm and dry.  Neurological:     General: No focal deficit present.     Mental Status: He is alert. Mental status is at baseline.  Psychiatric:        Mood and Affect: Mood normal.        Behavior: Behavior normal.        Thought Content: Thought content normal.        Judgment: Judgment normal.       Results for orders placed or performed in visit on 07/13/20  POCT UA - Microalbumin  Result Value Ref Range   Microalbumin Ur, POC 50 mg/L    Assessment & Plan     1. Type 2 diabetes mellitus with hyperglycemia, without long-term current use of insulin (HCC) Patient on ARB with Cozaar - Hemoglobin A1c - POCT UA - Microalbumin--50  2. Essential hypertension   3. Encounter for immunization  - [employer] Vaccine  4. Other hyperlipidemia On pravastatin  5. Class 1  obesity with serious comorbidity and body mass index (BMI) of 32.0 to 32.9 in adult, unspecified obesity type Diabetes hypertension hyperlipidemia   Return in about 4 months (around 11/11/2020).         Keeyon Privitera Cranford Mon, MD  [facility] [phone] (phone) [phone] (fax)  [place]

## 2020-07-14 ENCOUNTER — Telehealth: Payer: Self-pay | Admitting: Family Medicine

## 2020-07-14 LAB — HEMOGLOBIN A1C
Est. average glucose Bld gHb Est-mCnc: 163 mg/dL
Hgb A1c MFr Bld: 7.3 % — ABNORMAL HIGH (ref 4.8–5.6)

## 2020-07-14 NOTE — Telephone Encounter (Signed)
Noted  

## 2020-07-14 NOTE — Telephone Encounter (Signed)
Patient is calling to report that he received his flu shot in October or November 2021 from [place]. Please advise if need further clarity. [phone]

## 2020-08-09 ENCOUNTER — Telehealth: Payer: Self-pay | Admitting: Family Medicine

## 2020-08-09 DIAGNOSIS — E1169 Type 2 diabetes mellitus with other specified complication: Secondary | ICD-10-CM

## 2020-08-09 MED ORDER — GLIPIZIDE 10 MG PO TABS: ORAL_TABLET | ORAL | 3 refills | Status: DC

## 2020-08-09 NOTE — Telephone Encounter (Signed)
[facility] faxed refill request for the following medications:  glipiZIDE (GLUCOTROL) 10 MG tablet   Please advise. Thanks, [employer]

## 2020-08-21 ENCOUNTER — Encounter: Payer: Self-pay | Admitting: Family Medicine

## 2020-08-21 DIAGNOSIS — E1165 Type 2 diabetes mellitus with hyperglycemia: Secondary | ICD-10-CM

## 2020-08-21 DIAGNOSIS — E1169 Type 2 diabetes mellitus with other specified complication: Secondary | ICD-10-CM

## 2020-08-21 DIAGNOSIS — E785 Hyperlipidemia, unspecified: Secondary | ICD-10-CM

## 2020-08-22 MED ORDER — OMEPRAZOLE 20 MG PO CPDR
20.0000 mg | DELAYED_RELEASE_CAPSULE | Freq: Every day | ORAL | 1 refills | Status: DC
Start: 2020-08-22 — End: 2021-02-26

## 2020-08-22 MED ORDER — LOSARTAN POTASSIUM 100 MG PO TABS
100.0000 mg | ORAL_TABLET | Freq: Every day | ORAL | 1 refills | Status: DC
Start: 2020-08-22 — End: 2021-02-26

## 2020-08-22 MED ORDER — PRAVASTATIN SODIUM 40 MG PO TABS: ORAL_TABLET | ORAL | 1 refills | Status: DC

## 2020-08-22 MED ORDER — GLIPIZIDE 10 MG PO TABS: ORAL_TABLET | ORAL | 0 refills | Status: DC

## 2020-08-22 MED ORDER — GLIPIZIDE 10 MG PO TABS: ORAL_TABLET | ORAL | 1 refills | Status: DC

## 2020-08-22 MED ORDER — METFORMIN HCL 1000 MG PO TABS: ORAL_TABLET | ORAL | 1 refills | Status: DC

## 2020-08-22 MED ORDER — AMLODIPINE BESYLATE 10 MG PO TABS
10.0000 mg | ORAL_TABLET | Freq: Every day | ORAL | 1 refills | Status: DC
Start: 2020-08-22 — End: 2021-02-26

## 2020-08-22 MED ORDER — TRULICITY 1.5 MG/0.5ML ~~LOC~~ SOAJ: SUBCUTANEOUS | 1 refills | Status: DC

## 2020-09-13 ENCOUNTER — Telehealth: Payer: Self-pay | Admitting: *Deleted

## 2020-09-13 ENCOUNTER — Other Ambulatory Visit: Payer: Self-pay | Admitting: Family Medicine

## 2020-09-13 DIAGNOSIS — E1169 Type 2 diabetes mellitus with other specified complication: Secondary | ICD-10-CM

## 2020-09-13 NOTE — Telephone Encounter (Signed)
Requested Prescriptions  Pending Prescriptions Disp Refills  . glipiZIDE (GLUCOTROL) 10 MG tablet [Pharmacy Med Name: GLIPIZIDE 10 MG TABLET] 45 tablet 0    Sig: TAKE 1/2 TABLET BY MOUTH DAILY BEFORE BREAKFAST     Endocrinology:  Diabetes - Sulfonylureas Passed - 09/13/2020 11:31 AM      Passed - HBA1C is between 0 and 7.9 and within 180 days    Hgb A1c MFr Bld  Date Value Ref Range Status  07/13/2020 7.3 (H) 4.8 - 5.6 % Final    Comment:             Prediabetes: 5.7 - 6.4          Diabetes: >6.4          Glycemic control for adults with diabetes: <7.0          Passed - Valid encounter within last 6 months    Recent Outpatient Visits          2 months ago Type 2 diabetes mellitus with hyperglycemia, without long-term current use of insulin [facility])   [facility] Jerrol Banana., MD   6 months ago Type 2 diabetes mellitus with hyperglycemia, without long-term current use of insulin [facility])   [facility] Jerrol Banana., MD   7 months ago Welcome to [employer] preventive visit   [facility] Jerrol Banana., MD   9 months ago Scrotal mass   [place], PA-C   11 months ago Type 2 diabetes mellitus with hyperglycemia, without long-term current use of insulin [facility])   [facility] Jerrol Banana., MD      Future Appointments            In 2 months Jerrol Banana., MD [facility]

## 2020-09-13 NOTE — Chronic Care Management (AMB) (Signed)
  Chronic Care Management   Note  09/13/2020 Name: Jeffrey [name] MRN: [id number] DOB: 22-Sep-1955  Jeffrey [name] is a 65 y.o. year old male who is a primary care patient of Jerrol Banana., MD. I reached out to [name] by phone today in response to a referral sent by Jeffrey [name] [facility] health plan.     Jeffrey [name] was given information about Chronic Care Management services today including:  1. CCM service includes personalized support from designated clinical staff supervised by his physician, including individualized plan of care and coordination with other care providers 2. 24/7 contact phone numbers for assistance for urgent and routine care needs. 3. Service will only be billed when office clinical staff spend 20 minutes or more in a month to coordinate care. 4. Only one practitioner may furnish and bill the service in a calendar month. 5. The patient may stop CCM services at any time (effective at the end of the month) by phone call to the office staff. 6. The patient will be responsible for cost sharing (co-pay) of up to 20% of the service fee (after annual deductible is met).  Patient agreed to services and verbal consent obtained.   Follow up plan: Telephone appointment with care management team member scheduled for:10/13/2020  [place] Management

## 2020-10-12 ENCOUNTER — Telehealth: Payer: Self-pay

## 2020-10-12 NOTE — Progress Notes (Signed)
Spoke to patient to confirmed patient telephone appointment on 10/12/2020 for CCM at 11:00 am with [name] the Clinical pharmacist.   [place] Pharmacist Assistant [phone]

## 2020-10-13 ENCOUNTER — Ambulatory Visit (INDEPENDENT_AMBULATORY_CARE_PROVIDER_SITE_OTHER): Payer: Medicare Other

## 2020-10-13 DIAGNOSIS — E1159 Type 2 diabetes mellitus with other circulatory complications: Secondary | ICD-10-CM

## 2020-10-13 DIAGNOSIS — I152 Hypertension secondary to endocrine disorders: Secondary | ICD-10-CM | POA: Diagnosis not present

## 2020-10-13 DIAGNOSIS — E1169 Type 2 diabetes mellitus with other specified complication: Secondary | ICD-10-CM | POA: Diagnosis not present

## 2020-10-13 DIAGNOSIS — E785 Hyperlipidemia, unspecified: Secondary | ICD-10-CM

## 2020-10-13 NOTE — Progress Notes (Signed)
Chronic Care Management Pharmacy Note  10/23/2020 Name:  Anuel Sitter MRN:  [id number] DOB:  11/14/1955  Subjective: Jakaleb Payer is an 65 y.o. year old male who is a primary patient of Jerrol Banana., MD.  The CCM team was consulted for assistance with disease management and care coordination needs.    Engaged with patient by telephone for initial visit in response to provider referral for pharmacy case management and/or care coordination services.   Consent to Services:  The patient was given the following information about Chronic Care Management services today, agreed to services, and gave verbal consent: 1. CCM service includes personalized support from designated clinical staff supervised by the primary care provider, including individualized plan of care and coordination with other care providers 2. 24/7 contact phone numbers for assistance for urgent and routine care needs. 3. Service will only be billed when office clinical staff spend 20 minutes or more in a month to coordinate care. 4. Only one practitioner may furnish and bill the service in a calendar month. 5.The patient may stop CCM services at any time (effective at the end of the month) by phone call to the office staff. 6. The patient will be responsible for cost sharing (co-pay) of up to 20% of the service fee (after annual deductible is met). Patient agreed to services and consent obtained.  Patient Care Team: Jerrol Banana., MD as PCP - General (Family Medicine) Germaine Pomfret, [facility] (Pharmacist)  Recent office visits: 07/13/20: Patient presented to Dr. Rosanna Randy for follow-up. A1c stable at 7.3%. No medication changes made.   Recent consult visits: None in previous 6 months  Hospital visits: None in previous 6 months  Objective:  Lab Results  Component Value Date   CREATININE 1.32 (H) 02/29/2020   BUN 23 02/29/2020   GFRNONAA 56 (L) 02/29/2020   GFRAA 65 02/29/2020   NA 140  02/29/2020   K 4.7 02/29/2020   CALCIUM 10.2 02/29/2020   CO2 20 02/29/2020    Lab Results  Component Value Date/Time   HGBA1C 7.3 (H) 07/13/2020 09:03 AM   HGBA1C 7.4 (A) 02/29/2020 10:33 AM   HGBA1C 7.1 (A) 09/27/2019 04:30 PM   HGBA1C 7.0 (H) 01/14/2019 03:04 PM   MICROALBUR 50 07/13/2020 08:58 AM   MICROALBUR 20 12/10/2016 04:56 PM    Last diabetic Eye exam:  Lab Results  Component Value Date/Time   HMDIABEYEEXA No Retinopathy 06/13/2020 12:00 AM    Last diabetic Foot exam: No results found for: HMDIABFOOTEX   Lab Results  Component Value Date   CHOL 128 02/29/2020   HDL 27 (L) 02/29/2020   LDLCALC 43 02/29/2020   TRIG 395 (H) 02/29/2020   CHOLHDL 4.7 02/29/2020    Hepatic Function Latest Ref Rng & Units 02/29/2020 01/14/2019 07/23/2018  Total Protein 6.0 - 8.5 g/dL 7.3 7.5 7.3  Albumin 3.8 - 4.8 g/dL 4.7 4.6 4.5  AST 0 - 40 IU/L 22 22 23   ALT 0 - 44 IU/L 29 24 26   Alk Phosphatase 48 - 121 IU/L 48 39 45  Total Bilirubin 0.0 - 1.2 mg/dL 0.4 0.4 0.4  Bilirubin, Direct 0.1 - 0.5 mg/dL - - -    Lab Results  Component Value Date/Time   TSH 1.470 02/29/2020 10:59 AM   TSH 1.900 01/14/2019 03:04 PM    CBC Latest Ref Rng & Units 02/29/2020 01/14/2019 07/23/2018  WBC 3.4 - 10.8 x10E3/uL 9.1 9.7 7.6  Hemoglobin 13.0 - 17.7 g/dL 14.4  15.0 14.7  Hematocrit 37.5 - 51.0 % 43.7 45.1 43.8  Platelets 150 - 450 x10E3/uL 274 274 286    No results found for: VD25OH  Clinical ASCVD: No  The ASCVD Risk score Mikey Bussing DC Jr., et al., 2013) failed to calculate for the following reasons:   The valid total cholesterol range is 130 to 320 mg/dL    Depression screen [facility] 2/9 07/13/2020 02/03/2020 01/14/2019  Decreased Interest 0 0 0  Down, Depressed, Hopeless 0 0 0  PHQ - 2 Score 0 0 0  Altered sleeping 0 0 -  Tired, decreased energy 1 1 -  Change in appetite 1 1 -  Feeling bad or failure about yourself  0 0 -  Trouble concentrating 0 0 -  Moving slowly or fidgety/restless 0 0 -   Suicidal thoughts 0 0 -  PHQ-9 Score 2 2 -  Difficult doing work/chores Somewhat difficult Not difficult at all -     Social History   Tobacco Use  Smoking Status Former Smoker  . Packs/day: 1.50  . Types: Cigarettes  . Quit date: 08/11/1984  . Years since quitting: 36.2  Smokeless Tobacco Never Used   BP Readings from Last 3 Encounters:  02/29/20 121/75  02/03/20 131/71  11/22/19 (!) 160/80   Pulse Readings from Last 3 Encounters:  02/29/20 91  02/03/20 70  11/22/19 78   Wt Readings from Last 3 Encounters:  02/29/20 202 lb (91.6 kg)  02/03/20 203 lb (92.1 kg)  11/22/19 207 lb (93.9 kg)    Assessment/Interventions: Review of patient past medical history, allergies, medications, health status, including review of consultants reports, laboratory and other test data, was performed as part of comprehensive evaluation and provision of chronic care management services.   SDOH:  (Social Determinants of Health) assessments and interventions performed: Yes SDOH Interventions   Flowsheet Row Most Recent Value  SDOH Interventions   Financial Strain Interventions Intervention Not Indicated      CCM Care Plan  Allergies  Allergen Reactions  . Codeine Hives    Medications Reviewed Today    Reviewed by Germaine Pomfret, [facility] (Pharmacist) on 10/13/20 at 1135  Med List Status: <None>  Medication Order Taking? Sig Documenting Provider Last Dose Status Informant  albuterol (PROVENTIL HFA;VENTOLIN HFA) 108 (90 Base) MCG/ACT inhaler [id number] Yes Inhale 2 puffs into the lungs every 4 (four) hours as needed for wheezing or shortness of breath. [provider] Taking Active Self  ALLERGY RELIEF 10 MG tablet [id number] Yes TAKE 1 TABLET BY MOUTH  DAILY Jerrol Banana., MD Taking Active   amLODipine (NORVASC) 10 MG tablet [id number] Yes Take 1 tablet (10 mg total) by mouth daily. Jerrol Banana., MD Taking Active   aspirin 81 MG tablet [id number] Yes Take 81 mg by  mouth daily. [provider] Taking Active Self  blood glucose meter kit and supplies [id number]  Dispense based on patient and insurance preference. Use up to four times daily as directed. (FOR ICD-10 E10.9, E11.9). Jerrol Banana., MD  Active   Dulaglutide (TRULICITY) 1.5 KG/4.0NU [place] Aid [id number] Yes INJECT THE CONTENTS OF 1  PEN (1.5MG) SUBCUTANEOUSLY  WEEKLY AS DIRECTED Jerrol Banana., MD Taking Active   fenofibrate 160 MG tablet [id number]  Take 1 tablet (160 mg total) by mouth daily. Jerrol Banana., MD  Active   fluticasone [facility]) 50 MCG/ACT nasal spray [id number] Yes Place 2 sprays into both nostrils daily. Jerrol Banana., MD  Taking Active   glipiZIDE (GLUCOTROL) 10 MG tablet [id number] Yes TAKE 1/2 TABLET BY MOUTH DAILY BEFORE BREAKFAST Jerrol Banana., MD Taking Active   glucose blood [facility] VERIO) test strip [id number]  Check sugar twice daily, DX E11.9 Jerrol Banana., MD  Active   losartan (COZAAR) 100 MG tablet [id number] Yes Take 1 tablet (100 mg total) by mouth daily. Jerrol Banana., MD Taking Active   metFORMIN (GLUCOPHAGE) 1000 MG tablet [id number] Yes TAKE 1 TABLET BY MOUTH TWO  TIMES DAILY WITH MEALS Jerrol Banana., MD Taking Active   Microlet Lancets [place] [id number]  USE UP TO 4 TIMES DAILY AS  DIRECTED Jerrol Banana., MD  Active   Omega-3 Fatty Acids (FISH OIL) 1000 MG CAPS [id number] Yes Take 2,000 mg by mouth in the morning and at bedtime. [provider] Taking Active   omeprazole (PRILOSEC) 20 MG capsule [id number] Yes Take 1 capsule (20 mg total) by mouth daily. Jerrol Banana., MD Taking Active   pravastatin (PRAVACHOL) 40 MG tablet [id number] Yes TAKE 1 TABLET BY MOUTH  DAILY AT 6 PM. Jerrol Banana., MD Taking Active   sulfacetamide (BLEPH-10) 10 % ophthalmic solution [id number] No Place 2 drops into both eyes 4 (four) times daily.  Patient not taking: Reported on 10/13/2020    Carmon Ginsberg, [place] Not Taking Active            Med Note Olevia Bowens Aug 30, 2019  3:04 PM)            Patient Active Problem List   Diagnosis Date Noted  . Sinusitis 09/08/2015  . Hypertriglyceridemia 07/19/2015  . Pancreatitis 07/16/2015  . ED (erectile dysfunction) of organic origin 04/18/2015  . Hypertension 12/06/2014  . Seasonal allergies 12/06/2014  . Cholecystitis 12/06/2014  . Diverticulosis 12/06/2014  . GERD (gastroesophageal reflux disease) 12/06/2014  . Diabetes mellitus ([place]) 12/06/2014  . Plantar fasciitis 12/06/2014  . Actinic keratosis 12/06/2014  . Peyronie's disease 12/06/2014  . Erectile dysfunction 12/06/2014  . Obesity 12/06/2014  . Hyperlipidemia 12/06/2014  . Myalgia 12/06/2014    Immunization History  Administered Date(s) Administered  . Influenza Inj Mdck Quad Pf 05/27/2018  . Influenza Split 04/22/2012  . Influenza, High Dose Seasonal PF 05/12/2020  . Influenza,inj,Quad PF,6+ Mos 05/04/2013, 04/28/2014, 04/20/2015, 05/05/2017, 05/24/2019  . PFIZER(Purple Top)SARS-COV-2 Vaccination 09/25/2019, 10/16/2019, 07/13/2020  . Pneumococcal Polysaccharide-23 06/28/2010, 02/03/2020  . Td 01/14/2019  . Tdap 10/30/2007  . Zoster 03/12/2016    Conditions to be addressed/monitored:  Hypertension, Hyperlipidemia, Diabetes and GERD  Care Plan : General Pharmacy (Adult)  Updates made by Germaine Pomfret, RPH since 10/23/2020 12:00 AM    Problem: Hypertension, Hyperlipidemia, Diabetes and GERD   Priority: High    Long-Range Goal: Patient-Specific Goal   Start Date: 10/13/2020  Expected End Date: 04/15/2021  This Visit's Progress: On track  Priority: High  Note:   Current Barriers:  . Suboptimal therapeutic regimen for cholesterol  Pharmacist Clinical Goal(s):  Marland Kitchen Over the next 90 days, patient will achieve control of cholesterol as evidenced by LDL less than 70, triglycerides less than 150 . maintain control of diabetes as evidenced by A1c  less than 7.5%  through collaboration with PharmD and provider.   Interventions: . 1:1 collaboration with Jerrol Banana., MD regarding development and update of comprehensive plan of care as evidenced by provider attestation and co-signature . Inter-disciplinary care team collaboration (see longitudinal plan  of care) . Comprehensive medication review performed; medication list updated in electronic medical record  Hypertension (BP goal <130/80) -Controlled -Current treatment: . Amlodipine 10 mg daily  . Losartan 100 mg daily  -Medications previously tried: NA  -Current home readings: NA -Current dietary habits: Low sodium diet  -Current exercise habits: Patient is not following any specific dietary regimen -Denies hypotensive/hypertensive symptoms -Educated on Daily salt intake goal < 2300 mg; Exercise goal of 150 minutes per week; Importance of home blood pressure monitoring; -Counseled to monitor BP at home weekly, document, and provide log at future appointments -Recommended to continue current medication  Hyperlipidemia: (LDL goal < 70) -Not ideally controlled -Current treatment: . Fenofibrate 160 mg daily  . Fish Oil 1 g twice daily  . Pravastatin 40 mg nightly  -Medications previously tried: NA  -Educated on Importance of limiting foods high in cholesterol; -Recommended stopping OTC fish oil and starting Rx Lovaza 2g twice daily (with PAP if needed)  Diabetes (A1c goal <7.5%) -Diagnosed 1998 -Controlled -Current medications: Marland Kitchen Glipizide 10 mg 1/2 tablet daily  . Metformin 1000 mg twice daily  . Trulicity 1.5 mg weekly  -Medications previously tried: NA  -Current home glucose readings . fasting glucose: 180-200 . post prandial glucose: 91,100, 140 -Denies hypoglycemic/hyperglycemic symptoms -Current meal patterns:  . breakfast: NA  . lunch: NA  . dinner: NA . snacks: NA . drinks: NA . Late night snacking on cheese, crackers  -Educated  On Carbohydrate  counting and/or plate method.  - Morning elevations are likely due to late night snacking. Counseled patient to try and find healthier snacks or smaller portion sizes, limiting to 15 g carbohydrates per meal.  -Counseled to check feet daily and get yearly eye exams -Recommended to continue current medication  GERD (Goal: Prevent heartburn or reflux ) -Controlled -Current treatment  . Omeprazole 20 mg daily -Medications previously tried: NA  -Recommended to continue current medication   Patient Goals/Self-Care Activities . Over the next 90 days, patient will:  - check glucose twice daily, before breakfast and at bedtime, document, and provide at future appointments check blood pressure weekly, document, and provide at future appointments  Follow Up Plan: Telephone follow up appointment with care management team member scheduled for: 04/13/2021 at 10:00 AM      Medication Assistance: None required.  Patient affirms current coverage meets needs.  Patient's preferred pharmacy is:  [id number] [place] [address]Phone: [phone]: [phone] [facility] [address] [place]Phone: [phone]: [phone] [facility] [address] [place]Phone: [phone]: [phone] Uses pill box? Yes Pt endorses 100% compliance  We discussed: Current pharmacy is preferred with insurance plan and patient is satisfied with pharmacy services Patient decided to: Continue current medication management strategy  Care Plan and Follow Up Patient Decision:  Patient agrees to Care Plan and Follow-up.  Plan: Telephone follow up appointment with care management team member scheduled for:  04/13/2021 at 10:00 AM  [phone]

## 2020-10-23 NOTE — Patient Instructions (Signed)
Visit Information It was great speaking with you today!  Please let me know if you have any questions about our visit.  Goals Addressed            This Visit's Progress   . Monitor and Manage My Blood Sugar-Diabetes Type 2       Timeframe:  Long-Range Goal Priority:  High Start Date:   10/13/2020                          Expected End Date: 04/15/2021                      Follow Up Date 01/08/2021    - check blood sugar once daily before breakfast - check blood sugar if I feel it is too high or too low - enter blood sugar readings and medication or insulin into daily log    Why is this important?    Checking your blood sugar at home helps to keep it from getting very high or very low.   Writing the results in a diary or log helps the doctor know how to care for you.   Your blood sugar log should have the time, date and the results.   Also, write down the amount of insulin or other medicine that you take.   Other information, like what you ate, exercise done and how you were feeling, will also be helpful.     Notes:        Patient Care Plan: General Pharmacy (Adult)    Problem Identified: Hypertension, Hyperlipidemia, Diabetes and GERD   Priority: High    Long-Range Goal: Patient-Specific Goal   Start Date: 10/13/2020  Expected End Date: 04/15/2021  This Visit's Progress: On track  Priority: High  Note:   Current Barriers:  . Suboptimal therapeutic regimen for cholesterol  Pharmacist Clinical Goal(s):  Marland Kitchen Over the next 90 days, patient will achieve control of cholesterol as evidenced by LDL less than 70, triglycerides less than 150 . maintain control of diabetes as evidenced by A1c less than 7.5%  through collaboration with PharmD and provider.   Interventions: . 1:1 collaboration with Jerrol Banana., MD regarding development and update of comprehensive plan of care as evidenced by provider attestation and co-signature . Inter-disciplinary care team collaboration  (see longitudinal plan of care) . Comprehensive medication review performed; medication list updated in electronic medical record  Hypertension (BP goal <130/80) -Controlled -Current treatment: . Amlodipine 10 mg daily  . Losartan 100 mg daily  -Medications previously tried: NA  -Current home readings: NA -Current dietary habits: Low sodium diet  -Current exercise habits: Patient is not following any specific dietary regimen -Denies hypotensive/hypertensive symptoms -Educated on Daily salt intake goal < 2300 mg; Exercise goal of 150 minutes per week; Importance of home blood pressure monitoring; -Counseled to monitor BP at home weekly, document, and provide log at future appointments -Recommended to continue current medication  Hyperlipidemia: (LDL goal < 70) -Not ideally controlled -Current treatment: . Fenofibrate 160 mg daily  . Fish Oil 1 g twice daily  . Pravastatin 40 mg nightly  -Medications previously tried: NA  -Educated on Importance of limiting foods high in cholesterol; -Recommended stopping OTC fish oil and starting Rx Lovaza 2g twice daily (with PAP if needed)  Diabetes (A1c goal <7.5%) -Diagnosed 1998 -Controlled -Current medications: Marland Kitchen Glipizide 10 mg 1/2 tablet daily  . Metformin 1000 mg twice daily  .  Trulicity 1.5 mg weekly  -Medications previously tried: NA  -Current home glucose readings . fasting glucose: 180-200 . post prandial glucose: 91,100, 140 -Denies hypoglycemic/hyperglycemic symptoms -Current meal patterns:  . breakfast: NA  . lunch: NA  . dinner: NA . snacks: NA . drinks: NA . Late night snacking on cheese, crackers  -Educated  On Carbohydrate counting and/or plate method.  - Morning elevations are likely due to late night snacking. Counseled patient to try and find healthier snacks or smaller portion sizes, limiting to 15 g carbohydrates per meal.  -Counseled to check feet daily and get yearly eye exams -Recommended to continue  current medication  GERD (Goal: Prevent heartburn or reflux ) -Controlled -Current treatment  . Omeprazole 20 mg daily -Medications previously tried: NA  -Recommended to continue current medication   Patient Goals/Self-Care Activities . Over the next 90 days, patient will:  - check glucose twice daily, before breakfast and at bedtime, document, and provide at future appointments check blood pressure weekly, document, and provide at future appointments  Follow Up Plan: Telephone follow up appointment with care management team member scheduled for: 04/13/2021 at 10:00 AM      Mr. Kustra was given information about Chronic Care Management services today including:  1. CCM service includes personalized support from designated clinical staff supervised by his physician, including individualized plan of care and coordination with other care providers 2. 24/7 contact phone numbers for assistance for urgent and routine care needs. 3. Standard insurance, coinsurance, copays and deductibles apply for chronic care management only during months in which we provide at least 20 minutes of these services. Most insurances cover these services at 100%, however patients may be responsible for any copay, coinsurance and/or deductible if applicable. This service may help you avoid the need for more expensive face-to-face services. 4. Only one practitioner may furnish and bill the service in a calendar month. 5. The patient may stop CCM services at any time (effective at the end of the month) by phone call to the office staff.  Patient agreed to services and verbal consent obtained.   The patient verbalized understanding of instructions, educational materials, and care plan provided today and declined offer to receive copy of patient instructions, educational materials, and care plan.   Ashley [phone]

## 2020-11-16 ENCOUNTER — Ambulatory Visit: Payer: Self-pay | Admitting: Family Medicine

## 2020-12-07 ENCOUNTER — Ambulatory Visit: Payer: Self-pay | Admitting: Family Medicine

## 2020-12-13 NOTE — Progress Notes (Signed)
I,[name],acting as a scribe for [name], MD.,have documented all relevant documentation on the behalf of [name], MD,as directed by  [name], MD while in the presence of [name], MD.   Established patient visit   Patient: [name]   DOB: Aug 08, 1956   65 y.o. Male  MRN: [id number] Visit Date: 12/14/2020  Today's healthcare provider: [name], MD   Chief Complaint  Patient presents with  . Diabetes  . Hypertension  . Hyperlipidemia   Subjective    HPI  Overall patient feeling fairly well.  He is not exercising regularly.  He is not checking his blood pressure at home. Diabetes Mellitus Type II, follow-up  Lab Results  Component Value Date   HGBA1C 7.0 (A) 12/14/2020   HGBA1C 7.3 (H) 07/13/2020   HGBA1C 7.4 (A) 02/29/2020   Last seen for diabetes 4 months ago.  Management since then includes continuing the same treatment. He reports good compliance with treatment. He is not having side effects.   Home blood sugar records: fasting range: 160-200  Episodes of hypoglycemia? No    Current insulin regiment: none Most Recent Eye Exam: 06/13/2020  --------------------------------------------------------------------------------------------------- Hypertension, follow-up  BP Readings from Last 3 Encounters:  12/14/20 (!) 152/75  02/29/20 121/75  02/03/20 131/71   Wt Readings from Last 3 Encounters:  12/14/20 205 lb (93 kg)  02/29/20 202 lb (91.6 kg)  02/03/20 203 lb (92.1 kg)     He was last seen for hypertension 4 months ago.  BP at that visit was 121/75. Management since that visit includes no medication changes. He reports good compliance with treatment. He is not having side effects.  He is not exercising. He is not adherent to low salt diet.   Outside blood pressures are checked and average 150/ 83.  He does not smoke.  Use of agents associated with hypertension: none.    --------------------------------------------------------------------------------------------------- Lipid/Cholesterol, follow-up  Last Lipid Panel: Lab Results  Component Value Date   CHOL 128 02/29/2020   LDLCALC 43 02/29/2020   HDL 27 (L) 02/29/2020   TRIG 395 (H) 02/29/2020    He was last seen for this 4 months ago.  Management since that visit includes no medication changes. On pravastatin 19m.   He reports fair compliance with treatment. Patient is no longer taking Fenofibrate.  He is not having side effects.       Medications: Outpatient Medications Prior to Visit  Medication Sig  . albuterol (PROVENTIL HFA;VENTOLIN HFA) 108 (90 Base) MCG/ACT inhaler Inhale 2 puffs into the lungs every 4 (four) hours as needed for wheezing or shortness of breath.  . ALLERGY RELIEF 10 MG tablet TAKE 1 TABLET BY MOUTH  DAILY  . amLODipine (NORVASC) 10 MG tablet Take 1 tablet (10 mg total) by mouth daily.  .Marland Kitchenaspirin 81 MG tablet Take 81 mg by mouth daily.  . blood glucose meter kit and supplies Dispense based on patient and insurance preference. Use up to four times daily as directed. (FOR ICD-10 E10.9, E11.9).  . Dulaglutide (TRULICITY) 1.5 MCL/2.7NTSOPN INJECT THE CONTENTS OF 1  PEN (1.5MG) SUBCUTANEOUSLY  WEEKLY AS DIRECTED  . fluticasone (FLONASE) 50 MCG/ACT nasal spray Place 2 sprays into both nostrils daily.  .Marland KitchenglipiZIDE (GLUCOTROL) 10 MG tablet TAKE 1/2 TABLET BY MOUTH DAILY BEFORE BREAKFAST  . glucose blood (ONETOUCH VERIO) test strip Check sugar twice daily, DX E11.9  . losartan (COZAAR) 100 MG tablet Take 1  tablet (100 mg total) by mouth daily.  . metFORMIN (GLUCOPHAGE) 1000 MG tablet TAKE 1 TABLET BY MOUTH TWO  TIMES DAILY WITH MEALS  . Microlet Lancets MISC USE UP TO 4 TIMES DAILY AS  DIRECTED  . Omega-3 Fatty Acids (FISH OIL) 1000 MG CAPS Take 2,000 mg by mouth in the morning and at bedtime.  Marland Kitchen omeprazole (PRILOSEC) 20 MG capsule Take 1 capsule (20 mg total) by mouth daily.   . pravastatin (PRAVACHOL) 40 MG tablet TAKE 1 TABLET BY MOUTH  DAILY AT 6 PM.  . sulfacetamide (BLEPH-10) 10 % ophthalmic solution Place 2 drops into both eyes 4 (four) times daily.  . fenofibrate 160 MG tablet Take 1 tablet (160 mg total) by mouth daily.   No facility-administered medications prior to visit.    Review of Systems  Constitutional: Negative for activity change and fatigue.  Respiratory: Negative for shortness of breath.   Cardiovascular: Negative for chest pain, palpitations and leg swelling.  Endocrine: Negative for cold intolerance, heat intolerance, polydipsia, polyphagia and polyuria.  Musculoskeletal: Positive for myalgias (muscle cramps in arms, legs and chest).  Neurological: Negative for headaches.        Objective    BP (!) 152/75 (BP Location: Left Arm, Patient Position: Sitting, Cuff Size: Normal)   Pulse 67   Temp (!) 97.5 F (36.4 C) (Temporal)   Resp 16   Wt 205 lb (93 kg)   BMI 32.11 kg/m  BP Readings from Last 3 Encounters:  12/14/20 (!) 152/75  02/29/20 121/75  02/03/20 131/71   Wt Readings from Last 3 Encounters:  12/14/20 205 lb (93 kg)  02/29/20 202 lb (91.6 kg)  02/03/20 203 lb (92.1 kg)       Today's Vitals   12/14/20 1106 12/14/20 1111  BP: (!) 150/69 (!) 152/75  Pulse: 67   Resp: 16   Temp: (!) 97.5 F (36.4 C)   TempSrc: Temporal   Weight: 205 lb (93 kg)    Body mass index is 32.11 kg/m.  Physical Exam Vitals reviewed.  Constitutional:      Appearance: Normal appearance.  HENT:     Head: Normocephalic.     Nose: Nose normal.     Mouth/Throat:     Mouth: Mucous membranes are moist.  Eyes:     General: No scleral icterus. Cardiovascular:     Rate and Rhythm: Normal rate and regular rhythm.     Pulses: Normal pulses.     Heart sounds: Normal heart sounds.  Pulmonary:     Effort: Pulmonary effort is normal.     Breath sounds: Normal breath sounds.  Abdominal:     Palpations: Abdomen is soft.   Musculoskeletal:        General: No swelling.  Skin:    General: Skin is warm and dry.  Neurological:     General: No focal deficit present.     Mental Status: He is alert. Mental status is at baseline.  Psychiatric:        Mood and Affect: Mood normal.        Behavior: Behavior normal.        Thought Content: Thought content normal.        Judgment: Judgment normal.       Results for orders placed or performed in visit on 12/14/20  POCT glycosylated hemoglobin (Hb A1C)  Result Value Ref Range   Hemoglobin A1C 7.0 (A) 4.0 - 5.6 %   Est. average glucose Bld gHb Est-mCnc  154     Assessment & Plan     1. Type 2 diabetes mellitus with other specified complication, without long-term current use of insulin (HCC) A1c under good control with 7.0.  No changes in his diabetes care which includes glipizide, metformin - POCT glycosylated hemoglobin (Hb A1C)  2. Hyperlipidemia associated with type 2 diabetes mellitus ([place])   3. Gastroesophageal reflux disease, unspecified whether esophagitis present On omeprazole daily  4. Mixed hyperlipidemia On pravastatin and fenofibrate.  5. Class 1 obesity with serious comorbidity and body mass index (BMI) of 32.0 to 32.9 in adult, unspecified obesity type Diet and exercise stressed.  If patient can lose weight I think we can get him off the fenofibrate. 6.  Hypertension An [employer] or [employer] blood pressure cuff for home use  Return in about 4 months (around 04/16/2021).      I, [name], MD, have reviewed all documentation for this visit. The documentation on 12/18/20 for the exam, diagnosis, procedures, and orders are all accurate and complete.    Ebelin Dillehay Cranford Mon, MD  [facility] [phone] (phone) [phone] (fax)  [place]

## 2020-12-14 ENCOUNTER — Encounter: Payer: Self-pay | Admitting: Family Medicine

## 2020-12-14 ENCOUNTER — Telehealth: Payer: Self-pay

## 2020-12-14 ENCOUNTER — Other Ambulatory Visit: Payer: Self-pay

## 2020-12-14 ENCOUNTER — Ambulatory Visit (INDEPENDENT_AMBULATORY_CARE_PROVIDER_SITE_OTHER): Payer: Medicare Other | Admitting: Family Medicine

## 2020-12-14 VITALS — BP 152/75 | HR 67 | Temp 97.5°F | Resp 16 | Wt 205.0 lb

## 2020-12-14 DIAGNOSIS — E669 Obesity, unspecified: Secondary | ICD-10-CM

## 2020-12-14 DIAGNOSIS — K219 Gastro-esophageal reflux disease without esophagitis: Secondary | ICD-10-CM

## 2020-12-14 DIAGNOSIS — E785 Hyperlipidemia, unspecified: Secondary | ICD-10-CM

## 2020-12-14 DIAGNOSIS — E782 Mixed hyperlipidemia: Secondary | ICD-10-CM

## 2020-12-14 DIAGNOSIS — E1169 Type 2 diabetes mellitus with other specified complication: Secondary | ICD-10-CM

## 2020-12-14 DIAGNOSIS — Z6832 Body mass index (BMI) 32.0-32.9, adult: Secondary | ICD-10-CM

## 2020-12-14 DIAGNOSIS — E66811 Obesity, class 1: Secondary | ICD-10-CM

## 2020-12-14 LAB — POCT GLYCOSYLATED HEMOGLOBIN (HGB A1C)
Est. average glucose Bld gHb Est-mCnc: 154
Hemoglobin A1C: 7 % — AB (ref 4.0–5.6)

## 2020-12-14 MED ORDER — MICROLET LANCETS MISC: 3 refills | Status: DC

## 2020-12-14 MED ORDER — CONTOUR NEXT TEST VI STRP: ORAL_STRIP | 3 refills | Status: DC

## 2020-12-14 NOTE — Telephone Encounter (Signed)
RX sent off to [facility].   Thanks,   -Mickel Baas

## 2020-12-14 NOTE — Patient Instructions (Signed)
Get [employer] or [employer] blood pressure cuff. Check blood pressures at home and bring readings to your next follow up.

## 2020-12-14 NOTE — Telephone Encounter (Signed)
Copied from [place] [id number]. Topic: General - Other >> Dec 14, 2020  3:40 PM Pawlus, Brayton Layman A wrote: Reason for CRM: Pt called to let Dr Rosanna Randy know that he uses the Contour Next glucose meter. Pt stated he will also need the lancets and test strips for his device.

## 2021-01-12 ENCOUNTER — Telehealth: Payer: Self-pay

## 2021-01-12 NOTE — Progress Notes (Signed)
Chronic Care Management Pharmacy Assistant   Name: [name]  MRN: [id number] DOB: Dec 29, 1955   Reason for Encounter:Diabetes Disease State Call.  Recent office visits:  12/14/2020 Dr.Gilbert MD(PCP)   Recent consult visits:  No recent [name] visits:  None in previous 6 months  Medications: Outpatient Encounter Medications as of 01/12/2021  Medication Sig  . albuterol (PROVENTIL HFA;VENTOLIN HFA) 108 (90 Base) MCG/ACT inhaler Inhale 2 puffs into the lungs every 4 (four) hours as needed for wheezing or shortness of breath.  . ALLERGY RELIEF 10 MG tablet TAKE 1 TABLET BY MOUTH  DAILY  . amLODipine (NORVASC) 10 MG tablet Take 1 tablet (10 mg total) by mouth daily.  Marland Kitchen aspirin 81 MG tablet Take 81 mg by mouth daily.  . blood glucose meter kit and supplies Dispense based on patient and insurance preference. Use up to four times daily as directed. (FOR ICD-10 E10.9, E11.9).  . Dulaglutide (TRULICITY) 1.5 EZ/6.6QH SOPN INJECT THE CONTENTS OF 1  PEN (1.5MG) SUBCUTANEOUSLY  WEEKLY AS DIRECTED  . fenofibrate 160 MG tablet Take 1 tablet (160 mg total) by mouth daily.  . fluticasone (FLONASE) 50 MCG/ACT nasal spray Place 2 sprays into both nostrils daily.  Marland Kitchen glipiZIDE (GLUCOTROL) 10 MG tablet TAKE 1/2 TABLET BY MOUTH DAILY BEFORE BREAKFAST  . glucose blood (CONTOUR NEXT TEST) test strip Use up to four times daily as directed.  DX E11.9  . losartan (COZAAR) 100 MG tablet Take 1 tablet (100 mg total) by mouth daily.  . metFORMIN (GLUCOPHAGE) 1000 MG tablet TAKE 1 TABLET BY MOUTH TWO  TIMES DAILY WITH MEALS  . Microlet Lancets MISC USE UP TO 4 TIMES DAILY AS  DIRECTED  . Omega-3 Fatty Acids (FISH OIL) 1000 MG CAPS Take 2,000 mg by mouth in the morning and at bedtime.  Marland Kitchen omeprazole (PRILOSEC) 20 MG capsule Take 1 capsule (20 mg total) by mouth daily.  . pravastatin (PRAVACHOL) 40 MG tablet TAKE 1 TABLET BY MOUTH  DAILY AT 6 PM.  . sulfacetamide (BLEPH-10) 10 %  ophthalmic solution Place 2 drops into both eyes 4 (four) times daily.   No facility-administered encounter medications on file as of 01/12/2021.   Star Rating Drugs: Trulicity 1.5 mg last filled on 11/24/2020 for 84 day supply at [name]. Glipizide 10 mg last filled on 11/24/2020 for 90 day supply at [name]. Losartan 100 mg last filled on 11/24/2020 for 90 day supply at [facility]. Metformin 1000 mg last filled on 11/24/2020 for 90 day supply at [name]. Pravastatin 40 mg last filled on 11/24/2020 for 90 day supply at [name].  Recent Relevant Labs: Lab Results  Component Value Date/Time   HGBA1C 7.0 (A) 12/14/2020 11:18 AM   HGBA1C 7.3 (H) 07/13/2020 09:03 AM   HGBA1C 7.4 (A) 02/29/2020 10:33 AM   HGBA1C 7.0 (H) 01/14/2019 03:04 PM   MICROALBUR 50 07/13/2020 08:58 AM   MICROALBUR 20 12/10/2016 04:56 PM    Kidney Function Lab Results  Component Value Date/Time   CREATININE 1.32 (H) 02/29/2020 10:59 AM   CREATININE 1.50 (H) 01/14/2019 03:04 PM   GFRNONAA 56 (L) 02/29/2020 10:59 AM   GFRAA 65 02/29/2020 10:59 AM    . Current antihyperglycemic regimen:   Glipizide 10 mg 1/2 tablet daily   Metformin 1000 mg twice daily   Trulicity 1.5 mg weekly  . What recent interventions/DTPs have been made to improve glycemic control:  o None ID . Have there been  any recent hospitalizations or ED visits since last visit with CPP? No . Patient denies hypoglycemic symptoms, including Pale, Sweaty, Shaky, Hungry, Nervous/irritable and Vision changes . Patient reports hyperglycemic symptoms, including excessive thirst . How often are you checking your blood sugar? once daily . What are your blood sugars ranging?  o Fasting:  - Patient states his blood sugar was 175 in the past two mornings. o Before meals: N/A o After meals: N/A o Bedtime: N/A . During the week, how often does your blood glucose drop below 70? Never . Are you checking your feet  daily/regularly?   Patient denies any numbness, pain or tinging sensations in his feet.  Adherence Review: Is the patient currently on a STATIN medication? Yes Is the patient currently on ACE/ARB medication? Yes Does the patient have >5 day gap between last estimated fill dates? No  [name] Clinical Production designer, theatre/television/film [phone]

## 2021-02-25 ENCOUNTER — Other Ambulatory Visit: Payer: Self-pay | Admitting: Family Medicine

## 2021-02-25 DIAGNOSIS — E1165 Type 2 diabetes mellitus with hyperglycemia: Secondary | ICD-10-CM

## 2021-02-25 DIAGNOSIS — E785 Hyperlipidemia, unspecified: Secondary | ICD-10-CM

## 2021-02-25 DIAGNOSIS — E1169 Type 2 diabetes mellitus with other specified complication: Secondary | ICD-10-CM

## 2021-04-12 ENCOUNTER — Telehealth: Payer: Self-pay

## 2021-04-12 NOTE — Progress Notes (Signed)
    Chronic Care Management Pharmacy Assistant   Name: Heather Zeron  MRN: [id number] DOB: April 05, 1956  Patient called to be reminded of his appointment with [name], CPP on 04/13/2021 '@1000'$  via telephone.    Patient aware of appointment date, time, and type of appointment (either telephone or in person). Patient aware to have/bring all medications, supplements, blood pressure and/or blood sugar logs to visit.  Questions: Are there any concerns you would like to discuss during your office visit? No  Are you having any problems obtaining your medications? No  Star Rating Drug: Pravastatin 40 mg last filled on 02/26/2021 for a 90-Day supply with [facility] Trulicity 1.5 mg ast filled on 02/26/2021 for a 84-Day supply with [facility] Metformin 1000 mg ast filled on 02/26/2021 for a 90-Day supply with [facility] Glipizide 10 mg ast filled on 02/25/2021 for a 90-Day supply with [facility]  Any gaps in medications fill history? No  Care Gaps: Foot Exam  Zoster Vaccines Colonoscopy (last completed 10/03/2015) COVID-19 Vaccine Booster 4 PNA (last completed 02/03/2020) Influenza Vaccine (last completed 05/12/2020)  Lynann Bologna, CPA/CMA Clinical Pharmacist Assistant Phone: [phone]

## 2021-04-13 ENCOUNTER — Ambulatory Visit (INDEPENDENT_AMBULATORY_CARE_PROVIDER_SITE_OTHER): Payer: Medicare Other

## 2021-04-13 ENCOUNTER — Other Ambulatory Visit: Payer: Self-pay | Admitting: Family Medicine

## 2021-04-13 DIAGNOSIS — I152 Hypertension secondary to endocrine disorders: Secondary | ICD-10-CM | POA: Diagnosis not present

## 2021-04-13 DIAGNOSIS — E1159 Type 2 diabetes mellitus with other circulatory complications: Secondary | ICD-10-CM | POA: Diagnosis not present

## 2021-04-13 DIAGNOSIS — E1169 Type 2 diabetes mellitus with other specified complication: Secondary | ICD-10-CM | POA: Diagnosis not present

## 2021-04-13 NOTE — Patient Instructions (Signed)
Visit Information It was great speaking with you today!  Please let me know if you have any questions about our visit.   Goals Addressed             This Visit's Progress    Monitor and Manage My Blood Sugar-Diabetes Type 2   On track    Timeframe:  Long-Range Goal Priority:  High Start Date:   10/13/2020                          Expected End Date: 04/15/2022                      Follow Up Date 06/10/2021    - check blood sugar once daily before breakfast - check blood sugar if I feel it is too high or too low - enter blood sugar readings and medication or insulin into daily log    Why is this important?   Checking your blood sugar at home helps to keep it from getting very high or very low.  Writing the results in a diary or log helps the doctor know how to care for you.  Your blood sugar log should have the time, date and the results.  Also, write down the amount of insulin or other medicine that you take.  Other information, like what you ate, exercise done and how you were feeling, will also be helpful.     Notes:         Patient Care Plan: General Pharmacy (Adult)     Problem Identified: Hypertension, Hyperlipidemia, Diabetes and GERD   Priority: High     Long-Range Goal: Patient-Specific Goal   Start Date: 10/13/2020  Expected End Date: 04/13/2022  This Visit's Progress: On track  Recent Progress: On track  Priority: High  Note:   Current Barriers:  Suboptimal therapeutic regimen for cholesterol  Pharmacist Clinical Goal(s):  Over the next 90 days, patient will achieve control of cholesterol as evidenced by LDL less than 70, triglycerides less than 150 maintain control of diabetes as evidenced by A1c less than 7.5%  through collaboration with PharmD and provider.   Interventions: 1:1 collaboration with Jerrol Banana., MD regarding development and update of comprehensive plan of care as evidenced by provider attestation and  co-signature Inter-disciplinary care team collaboration (see longitudinal plan of care) Comprehensive medication review performed; medication list updated in electronic medical record  Hypertension (BP goal <130/80) -Controlled -Current treatment: Amlodipine 10 mg daily  Losartan 100 mg daily  -Medications previously tried: NA  -Current home readings: 140-150s/71-80s  -Denies hypotensive/hypertensive symptoms -Counseled to monitor BP at home twice weekly, document, and provide log at future appointments -Recommended to continue current medication  Hyperlipidemia: (LDL goal < 70) -Not ideally controlled -Current treatment: Fish Oil 1 g twice daily  Pravastatin 40 mg nightly  -Medications previously tried: NA  -Educated on Importance of limiting foods high in cholesterol;  Diabetes (A1c goal <7.5%) -Diagnosed 1998 -Controlled -Current medications: Glipizide 10 mg 1/2 tablet daily  Metformin 1000 mg twice daily  Trulicity 1.5 mg weekly  -Medications previously tried: NA  -Current home glucose readings fasting glucose: 160-220  post prandial glucose: 91,100, 140 -Denies hypoglycemic/hyperglycemic symptoms -Current meal patterns: Continues to snack late at night on Cheese + Crackers OR PB&J sandwiches -Increase Trulicity to 3 mg weekly. Start PAP for Trulicity  -STOP Glipizide once PAP approved for higher Trulicity dose   GERD (Goal: Prevent  heartburn or reflux ) -Controlled -Current treatment  Omeprazole 20 mg daily -Medications previously tried: NA  -Recommended to continue current medication  Patient Goals/Self-Care Activities Over the next 90 days, patient will:  - check glucose twice daily, before breakfast and at bedtime, document, and provide at future appointments check blood pressure twice weekly, document, and provide at future appointments  Follow Up Plan: Face to Face appointment with care management team member scheduled for: 06/22/2021 at 10:00 AM       Patient agreed to services and verbal consent obtained.   Patient verbalizes understanding of instructions provided today and agrees to view in [place].   [name], PharmD, [facility] [place], [place] [phone]

## 2021-04-13 NOTE — Telephone Encounter (Signed)
Valid encounter . No future visit at this time

## 2021-04-13 NOTE — Progress Notes (Signed)
Chronic Care Management Pharmacy Note  04/13/2021 Name:  Jeffrey [name] MRN:  [id number] DOB:  1956/04/29  Summary: Patient presents for CCM follow-up. Home blood pressures have been elevated. He is concerned about the cost of his Trulicity  Recommendations/Changes made from today's visit: Increase Trulicity to 3 mg weekly. Start PAP for Trulicity  STOP Glipizide once PAP approved for higher Trulicity dose   Plan: CPP follow-up in 2 months   Subjective: Jeffrey [name] is an 65 y.o. year old male who is a primary patient of Jerrol Banana., MD.  The CCM team was consulted for assistance with disease management and care coordination needs.    Engaged with patient by telephone for follow up visit in response to provider referral for pharmacy case management and/or care coordination services.   Consent to Services:  The patient was given information about Chronic Care Management services, agreed to services, and gave verbal consent prior to initiation of services.  Please see initial visit note for detailed documentation.   Patient Care Team: Jerrol Banana., MD as PCP - General (Family Medicine) Germaine Pomfret, [facility] (Pharmacist)  Recent office visits: 12/14/20: Patient presented to Dr. Rosanna Randy for follow-up. A1c improved to 7.0%. BP 152/75.   Recent consult visits: None in previous 6 months  Hospital visits: None in previous 6 months  Objective:  Lab Results  Component Value Date   CREATININE 1.32 (H) 02/29/2020   BUN 23 02/29/2020   GFRNONAA 56 (L) 02/29/2020   GFRAA 65 02/29/2020   NA 140 02/29/2020   K 4.7 02/29/2020   CALCIUM 10.2 02/29/2020   CO2 20 02/29/2020    Lab Results  Component Value Date/Time   HGBA1C 7.0 (A) 12/14/2020 11:18 AM   HGBA1C 7.3 (H) 07/13/2020 09:03 AM   HGBA1C 7.4 (A) 02/29/2020 10:33 AM   HGBA1C 7.0 (H) 01/14/2019 03:04 PM   MICROALBUR 50 07/13/2020 08:58 AM   MICROALBUR 20 12/10/2016 04:56 PM    Last  diabetic Eye exam:  Lab Results  Component Value Date/Time   HMDIABEYEEXA No Retinopathy 06/13/2020 12:00 AM    Last diabetic Foot exam: No results found for: HMDIABFOOTEX   Lab Results  Component Value Date   CHOL 128 02/29/2020   HDL 27 (L) 02/29/2020   LDLCALC 43 02/29/2020   TRIG 395 (H) 02/29/2020   CHOLHDL 4.7 02/29/2020    Hepatic Function Latest Ref Rng & Units 02/29/2020 01/14/2019 07/23/2018  Total Protein 6.0 - 8.5 g/dL 7.3 7.5 7.3  Albumin 3.8 - 4.8 g/dL 4.7 4.6 4.5  AST 0 - 40 IU/L 22 22 23   ALT 0 - 44 IU/L 29 24 26   Alk Phosphatase 48 - 121 IU/L 48 39 45  Total Bilirubin 0.0 - 1.2 mg/dL 0.4 0.4 0.4  Bilirubin, Direct 0.1 - 0.5 mg/dL - - -    Lab Results  Component Value Date/Time   TSH 1.470 02/29/2020 10:59 AM   TSH 1.900 01/14/2019 03:04 PM    CBC Latest Ref Rng & Units 02/29/2020 01/14/2019 07/23/2018  WBC 3.4 - 10.8 x10E3/uL 9.1 9.7 7.6  Hemoglobin 13.0 - 17.7 g/dL 14.4 15.0 14.7  Hematocrit 37.5 - 51.0 % 43.7 45.1 43.8  Platelets 150 - 450 x10E3/uL 274 274 286    No results found for: VD25OH  Clinical ASCVD: No  The ASCVD Risk score Mikey Bussing DC Jr., et al., 2013) failed to calculate for the following reasons:   The valid total cholesterol range is 130 to  320 mg/dL    Depression screen [facility] 2/9 07/13/2020 02/03/2020 01/14/2019  Decreased Interest 0 0 0  Down, Depressed, Hopeless 0 0 0  PHQ - 2 Score 0 0 0  Altered sleeping 0 0 -  Tired, decreased energy 1 1 -  Change in appetite 1 1 -  Feeling bad or failure about yourself  0 0 -  Trouble concentrating 0 0 -  Moving slowly or fidgety/restless 0 0 -  Suicidal thoughts 0 0 -  PHQ-9 Score 2 2 -  Difficult doing work/chores Somewhat difficult Not difficult at all -     Social History   Tobacco Use  Smoking Status Former   Packs/day: 1.50   Types: Cigarettes   Quit date: 08/11/1984   Years since quitting: 36.6  Smokeless Tobacco Never   BP Readings from Last 3 Encounters:  12/14/20 (!) 152/75   02/29/20 121/75  02/03/20 131/71   Pulse Readings from Last 3 Encounters:  12/14/20 67  02/29/20 91  02/03/20 70   Wt Readings from Last 3 Encounters:  12/14/20 205 lb (93 kg)  02/29/20 202 lb (91.6 kg)  02/03/20 203 lb (92.1 kg)    Assessment/Interventions: Review of patient past medical history, allergies, medications, health status, including review of consultants reports, laboratory and other test data, was performed as part of comprehensive evaluation and provision of chronic care management services.   SDOH:  (Social Determinants of Health) assessments and interventions performed: Yes SDOH Interventions    Flowsheet Row Most Recent Value  SDOH Interventions   Financial Strain Interventions Other (Comment)  [PAP]        CCM Care Plan  Allergies  Allergen Reactions   Codeine Hives    Medications Reviewed Today     Reviewed by Randal Buba, CMA (Certified Medical Assistant) on 12/14/20 at 1105  Med List Status: <None>   Medication Order Taking? Sig Documenting Provider Last Dose Status Informant  albuterol (PROVENTIL HFA;VENTOLIN HFA) 108 (90 Base) MCG/ACT inhaler [id number] Yes Inhale 2 puffs into the lungs every 4 (four) hours as needed for wheezing or shortness of breath. [provider] Taking Active Self  ALLERGY RELIEF 10 MG tablet [id number] Yes TAKE 1 TABLET BY MOUTH  DAILY Jerrol Banana., MD Taking Active   amLODipine (NORVASC) 10 MG tablet [id number] Yes Take 1 tablet (10 mg total) by mouth daily. Jerrol Banana., MD Taking Active   aspirin 81 MG tablet [id number] Yes Take 81 mg by mouth daily. [provider] Taking Active Self  blood glucose meter kit and supplies [id number] Yes Dispense based on patient and insurance preference. Use up to four times daily as directed. (FOR ICD-10 E10.9, E11.9). Jerrol Banana., MD Taking Active   Dulaglutide (TRULICITY) 1.5 MI/6.8EH [place] [facility] [id number] Yes INJECT THE CONTENTS OF 1  PEN  (1.5MG) SUBCUTANEOUSLY  WEEKLY AS DIRECTED Jerrol Banana., MD Taking Active   fenofibrate 160 MG tablet [id number]  Take 1 tablet (160 mg total) by mouth daily. Jerrol Banana., MD  Active   fluticasone [facility]) 50 MCG/ACT nasal spray [id number] Yes Place 2 sprays into both nostrils daily. Jerrol Banana., MD Taking Active   glipiZIDE (GLUCOTROL) 10 MG tablet [id number] Yes TAKE 1/2 TABLET BY MOUTH DAILY BEFORE BREAKFAST Jerrol Banana., MD Taking Active   glucose blood [facility] VERIO) test strip [id number] Yes Check sugar twice daily, DX E11.9 Jerrol Banana., MD Taking Active   losartan (COZAAR) 100  MG tablet [id number] Yes Take 1 tablet (100 mg total) by mouth daily. Jerrol Banana., MD Taking Active   metFORMIN (GLUCOPHAGE) 1000 MG tablet [id number] Yes TAKE 1 TABLET BY MOUTH TWO  TIMES DAILY WITH MEALS Jerrol Banana., MD Taking Active   Microlet Lancets [place] [id number] Yes USE UP TO 4 TIMES DAILY AS  DIRECTED Jerrol Banana., MD Taking Active   Omega-3 Fatty Acids (FISH OIL) 1000 MG CAPS [id number] Yes Take 2,000 mg by mouth in the morning and at bedtime. [provider] Taking Active   omeprazole (PRILOSEC) 20 MG capsule [id number] Yes Take 1 capsule (20 mg total) by mouth daily. Jerrol Banana., MD Taking Active   pravastatin (PRAVACHOL) 40 MG tablet [id number] Yes TAKE 1 TABLET BY MOUTH  DAILY AT 6 PM. Jerrol Banana., MD Taking Active   sulfacetamide (BLEPH-10) 10 % ophthalmic solution [id number] Yes Place 2 drops into both eyes 4 (four) times daily. Carmon Ginsberg, PA Taking Active            Med Note Olevia Bowens Aug 30, 2019  3:04 PM)              Patient Active Problem List   Diagnosis Date Noted   Sinusitis 09/08/2015   Hypertriglyceridemia 07/19/2015   Pancreatitis 07/16/2015   ED (erectile dysfunction) of organic origin 04/18/2015   Hypertension 12/06/2014   Seasonal allergies 12/06/2014    Cholecystitis 12/06/2014   Diverticulosis 12/06/2014   GERD (gastroesophageal reflux disease) 12/06/2014   Diabetes mellitus ([place]) 12/06/2014   Plantar fasciitis 12/06/2014   Actinic keratosis 12/06/2014   Peyronie's disease 12/06/2014   Erectile dysfunction 12/06/2014   Obesity 12/06/2014   Hyperlipidemia 12/06/2014   Myalgia 12/06/2014    Immunization History  Administered Date(s) Administered   Influenza Inj Mdck Quad Pf 05/27/2018   Influenza Split 04/22/2012   Influenza, High Dose Seasonal PF 05/12/2020   Influenza,inj,Quad PF,6+ Mos 05/04/2013, 04/28/2014, 04/20/2015, 05/05/2017, 05/24/2019   PFIZER(Purple Top)SARS-COV-2 Vaccination 09/25/2019, 10/16/2019, 07/13/2020   Pneumococcal Polysaccharide-23 06/28/2010, 02/03/2020   Td 01/14/2019   Tdap 10/30/2007   Zoster, Live 03/12/2016    Conditions to be addressed/monitored:  Hypertension, Hyperlipidemia, Diabetes and GERD  Care Plan : General Pharmacy (Adult)  Updates made by Germaine Pomfret, [place] since 04/13/2021 12:00 AM     Problem: Hypertension, Hyperlipidemia, Diabetes and GERD   Priority: High     Long-Range Goal: Patient-Specific Goal   Start Date: 10/13/2020  Expected End Date: 04/13/2022  This Visit's Progress: On track  Recent Progress: On track  Priority: High  Note:   Current Barriers:  Suboptimal therapeutic regimen for cholesterol  Pharmacist Clinical Goal(s):  Over the next 90 days, patient will achieve control of cholesterol as evidenced by LDL less than 70, triglycerides less than 150 maintain control of diabetes as evidenced by A1c less than 7.5%  through collaboration with PharmD and provider.   Interventions: 1:1 collaboration with Jerrol Banana., MD regarding development and update of comprehensive plan of care as evidenced by provider attestation and co-signature Inter-disciplinary care team collaboration (see longitudinal plan of care) Comprehensive medication review performed;  medication list updated in electronic medical record  Hypertension (BP goal <130/80) -Controlled -Current treatment: Amlodipine 10 mg daily  Losartan 100 mg daily  -Medications previously tried: NA  -Current home readings: 140-150s/71-80s  -Denies hypotensive/hypertensive symptoms -Counseled to monitor BP at home twice weekly, document, and provide log at future appointments -  Recommended to continue current medication  Hyperlipidemia: (LDL goal < 70) -Not ideally controlled -Current treatment: Fish Oil 1 g twice daily  Pravastatin 40 mg nightly  -Medications previously tried: NA  -Educated on Importance of limiting foods high in cholesterol;  Diabetes (A1c goal <7.5%) -Diagnosed 1998 -Controlled -Current medications: Glipizide 10 mg 1/2 tablet daily  Metformin 1000 mg twice daily  Trulicity 1.5 mg weekly  -Medications previously tried: NA  -Current home glucose readings fasting glucose: 160-220  post prandial glucose: 91,100, 140 -Denies hypoglycemic/hyperglycemic symptoms -Current meal patterns: Continues to snack late at night on Cheese + Crackers OR PB&J sandwiches -Increase Trulicity to 3 mg weekly. Start PAP for Trulicity  -STOP Glipizide once PAP approved for higher Trulicity dose   GERD (Goal: Prevent heartburn or reflux ) -Controlled -Current treatment  Omeprazole 20 mg daily -Medications previously tried: NA  -Recommended to continue current medication  Patient Goals/Self-Care Activities Over the next 90 days, patient will:  - check glucose twice daily, before breakfast and at bedtime, document, and provide at future appointments check blood pressure twice weekly, document, and provide at future appointments  Follow Up Plan: Face to Face appointment with care management team member scheduled for: 06/22/2021 at 10:00 AM       Medication Assistance: Application for Trulicity  medication assistance program. in process.  Anticipated assistance start date  TBD.  See plan of care for additional detail.  Patient's preferred pharmacy is:  [id number] [place], [id number]: [phone]: [phone] [facility] [id number]: [phone]: [phone] Uses pill box? Yes Pt endorses 100% compliance  We discussed: Current pharmacy is preferred with insurance plan and patient is satisfied with pharmacy services Patient decided to: Continue current medication management strategy  Care Plan and Follow Up Patient Decision:  Patient agrees to Care Plan and Follow-up.  Plan: Face to Face appointment with care management team member scheduled for: 06/22/2021 at 10:00 AM  [id number]

## 2021-04-18 ENCOUNTER — Telehealth: Payer: Self-pay

## 2021-04-18 NOTE — Progress Notes (Signed)
    Chronic Care Management Pharmacy Assistant   Name: Trayden Brandy  MRN: [id number] DOB: [birth year]  Patient Assistance Application for Trulicity  I received a task from [name], CPP requesting that I start the application for patient assistance on the medication Trulicity.   Spoke with the patient and he requested that the application be mailed to his home. I informed him that once he receives the application in the mail he will need to complete his part of the application and return it to the office for [name], CPP to fax over to the [facility] for processing. The application will need to be faxed to [phone] and the phone number that can be called to check the status of the application will be [phone]. Patient verbalized understanding and was provided my phone number of [phone] if he has any questions.  Application emailed to [name], CPP for mailing to patient's home.  Medications: Outpatient Encounter Medications as of 04/18/2021  Medication Sig   amLODipine (NORVASC) 10 MG tablet TAKE 1 TABLET DAILY   CONTOUR NEXT TEST test strip CHECK BLOOD SUGAR UP TO FOUR TIMES A DAY AS DIRECTED   Dulaglutide (TRULICITY) 1.5 WL/2.9VF SOPN INJECT THE CONTENTS OF 1 PEN (1.5 MG) UNDER THE SKIN WEEKLY AS DIRECTED   losartan (COZAAR) 100 MG tablet TAKE 1 TABLET DAILY   metFORMIN (GLUCOPHAGE) 1000 MG tablet TAKE 1 TABLET TWICE A DAY WITH MEALS   pravastatin (PRAVACHOL) 40 MG tablet TAKE 1 TABLET DAILY AT 6 P.M.   albuterol (PROVENTIL HFA;VENTOLIN HFA) 108 (90 Base) MCG/ACT inhaler Inhale 2 puffs into the lungs every 4 (four) hours as needed for wheezing or shortness of breath.   ALLERGY RELIEF 10 MG tablet TAKE 1 TABLET BY MOUTH  DAILY   aspirin 81 MG tablet Take 81 mg by mouth daily.   blood glucose meter kit and supplies Dispense based on patient and insurance preference. Use up to four times daily as directed. (FOR ICD-10 E10.9, E11.9).   fluticasone  (FLONASE) 50 MCG/ACT nasal spray Place 2 sprays into both nostrils daily.   glipiZIDE (GLUCOTROL) 10 MG tablet TAKE 1/2 TABLET BY MOUTH DAILY BEFORE BREAKFAST   Microlet Lancets MISC USE UP TO 4 TIMES DAILY AS  DIRECTED   Omega-3 Fatty Acids (FISH OIL) 1000 MG CAPS Take 2,000 mg by mouth in the morning and at bedtime.   omeprazole (PRILOSEC) 20 MG capsule TAKE 1 CAPSULE DAILY   sulfacetamide (BLEPH-10) 10 % ophthalmic solution Place 2 drops into both eyes 4 (four) times daily.   No facility-administered encounter medications on file as of 04/18/2021.   Lynann Bologna, CPA/CMA Clinical Pharmacist Assistant Phone: [phone]

## 2021-05-07 ENCOUNTER — Other Ambulatory Visit: Payer: Self-pay

## 2021-05-07 ENCOUNTER — Encounter: Payer: Self-pay | Admitting: Family Medicine

## 2021-05-07 ENCOUNTER — Ambulatory Visit (INDEPENDENT_AMBULATORY_CARE_PROVIDER_SITE_OTHER): Payer: Medicare Other | Admitting: Family Medicine

## 2021-05-07 VITALS — BP 130/78 | HR 72 | Temp 98.6°F | Resp 16 | Ht 67.0 in | Wt 204.0 lb

## 2021-05-07 DIAGNOSIS — Z23 Encounter for immunization: Secondary | ICD-10-CM | POA: Diagnosis not present

## 2021-05-07 DIAGNOSIS — Z0000 Encounter for general adult medical examination without abnormal findings: Secondary | ICD-10-CM | POA: Diagnosis not present

## 2021-05-07 DIAGNOSIS — Z1211 Encounter for screening for malignant neoplasm of colon: Secondary | ICD-10-CM

## 2021-05-07 DIAGNOSIS — I10 Essential (primary) hypertension: Secondary | ICD-10-CM | POA: Diagnosis not present

## 2021-05-07 DIAGNOSIS — E1169 Type 2 diabetes mellitus with other specified complication: Secondary | ICD-10-CM

## 2021-05-07 DIAGNOSIS — K219 Gastro-esophageal reflux disease without esophagitis: Secondary | ICD-10-CM

## 2021-05-07 DIAGNOSIS — E782 Mixed hyperlipidemia: Secondary | ICD-10-CM

## 2021-05-07 NOTE — Progress Notes (Signed)
I,[name],acting as a scribe for [name], MD.,have documented all relevant documentation on the behalf of [name], MD,as directed by  [name], MD while in the presence of [name], MD.  Annual Wellness Visit     Patient: Jeffrey [name], Male    DOB: 05-12-1956, 65 y.o.   MRN: [id number] Visit Date: 05/07/2021  Today's Provider: [name], MD   Chief Complaint  Patient presents with   Medicare [place] is a 65 y.o. male who presents today for his Annual Wellness Visit.Annual Physical. He reports consuming a general diet. The patient does not participate in regular exercise at present. He generally feels well. He reports sleeping well. He does not have additional problems to discuss today.  He is still mourning the death of his grandson who died about a year ago now from leukemia.  His son just had a kidney transplant is doing well. HPI    Medications: Outpatient Medications Prior to Visit  Medication Sig   albuterol (PROVENTIL HFA;VENTOLIN HFA) 108 (90 Base) MCG/ACT inhaler Inhale 2 puffs into the lungs every 4 (four) hours as needed for wheezing or shortness of breath.   ALLERGY RELIEF 10 MG tablet TAKE 1 TABLET BY MOUTH  DAILY   amLODipine (NORVASC) 10 MG tablet TAKE 1 TABLET DAILY   aspirin 81 MG tablet Take 81 mg by mouth daily.   blood glucose meter kit and supplies Dispense based on patient and insurance preference. Use up to four times daily as directed. (FOR ICD-10 E10.9, E11.9).   CONTOUR NEXT TEST test strip CHECK BLOOD SUGAR UP TO FOUR TIMES A DAY AS DIRECTED   Dulaglutide (TRULICITY) 1.5 ZJ/6.9CV SOPN INJECT THE CONTENTS OF 1 PEN (1.5 MG) UNDER THE SKIN WEEKLY AS DIRECTED   fluticasone (FLONASE) 50 MCG/ACT nasal spray Place 2 sprays into both nostrils daily.   glipiZIDE (GLUCOTROL) 10 MG tablet TAKE 1/2 TABLET BY MOUTH DAILY BEFORE BREAKFAST   losartan (COZAAR) 100 MG  tablet TAKE 1 TABLET DAILY   metFORMIN (GLUCOPHAGE) 1000 MG tablet TAKE 1 TABLET TWICE A DAY WITH MEALS   Microlet Lancets MISC USE UP TO 4 TIMES DAILY AS  DIRECTED   Omega-3 Fatty Acids (FISH OIL) 1000 MG CAPS Take 2,000 mg by mouth in the morning and at bedtime.   omeprazole (PRILOSEC) 20 MG capsule TAKE 1 CAPSULE DAILY   pravastatin (PRAVACHOL) 40 MG tablet TAKE 1 TABLET DAILY AT 6 P.M.   sulfacetamide (BLEPH-10) 10 % ophthalmic solution Place 2 drops into both eyes 4 (four) times daily.   No facility-administered medications prior to visit.    Allergies  Allergen Reactions   Codeine Hives    Patient Care Team: Jerrol Banana., MD as PCP - General (Family Medicine) Germaine Pomfret, [facility] (Pharmacist)  Review of Systems  HENT:  Positive for rhinorrhea and tinnitus.   Cardiovascular:  Positive for palpitations.  Musculoskeletal:  Positive for arthralgias.  Allergic/Immunologic: Positive for environmental allergies.  All other systems reviewed and are negative.       Objective    Vitals: BP 130/78 (BP Location: Right Arm, Patient Position: Sitting, Cuff Size: Large)   Pulse 72   Temp 98.6 F (37 C) (Oral)   Resp 16   Ht 5' 7"  (1.702 m)   Wt 204 lb (92.5 kg)   SpO2 97%   BMI 31.95 kg/m  BP Readings from Last  3 Encounters:  05/07/21 130/78  12/14/20 (!) 152/75  02/29/20 121/75   Wt Readings from Last 3 Encounters:  05/07/21 204 lb (92.5 kg)  12/14/20 205 lb (93 kg)  02/29/20 202 lb (91.6 kg)      Physical Exam Vitals and nursing note reviewed. Exam conducted with a chaperone present.  Constitutional:      Appearance: Normal appearance. He is normal weight.  HENT:     Right Ear: Tympanic membrane normal.     Left Ear: Tympanic membrane normal.     Nose: Nose normal.     Mouth/Throat:     Mouth: Mucous membranes are moist.  Cardiovascular:     Rate and Rhythm: Normal rate and regular rhythm.     Pulses: Normal pulses.     Heart sounds: Normal  heart sounds.  Pulmonary:     Effort: Pulmonary effort is normal.     Breath sounds: Normal breath sounds.  Abdominal:     General: Bowel sounds are normal.     Palpations: Abdomen is soft.  Musculoskeletal:     Cervical back: Normal range of motion and neck supple.     Right lower leg: No edema.     Left lower leg: No edema.  Skin:    General: Skin is warm.  Neurological:     General: No focal deficit present.     Mental Status: He is alert.  Psychiatric:        Mood and Affect: Mood normal.        Behavior: Behavior normal.        Thought Content: Thought content normal.        Judgment: Judgment normal.     Most recent functional status assessment: In your present state of health, do you have any difficulty performing the following activities: 05/07/2021  Hearing? N  Vision? N  Difficulty concentrating or making decisions? N  Walking or climbing stairs? Y  Dressing or bathing? N  Doing errands, shopping? N  Some recent data might be hidden   Most recent fall risk assessment: Fall Risk  05/07/2021  Falls in the past year? 0  Number falls in past yr: 0  Injury with Fall? 0  Risk for fall due to : No Fall Risks  Follow up Falls evaluation completed    Most recent depression screenings: PHQ 2/9 Scores 05/07/2021 07/13/2020  PHQ - 2 Score 0 0  PHQ- 9 Score 2 2   Most recent cognitive screening: No flowsheet data found. Most recent Audit-C alcohol use screening Alcohol Use Disorder Test (AUDIT) 05/07/2021  1. How often do you have a drink containing alcohol? 0  2. How many drinks containing alcohol do you have on a typical day when you are drinking? 0  3. How often do you have six or more drinks on one occasion? 0  AUDIT-C Score 0  Alcohol Brief Interventions/Follow-up -   A score of 3 or more in women, and 4 or more in men indicates increased risk for alcohol abuse, EXCEPT if all of the points are from question 1   No results found for any visits on 05/07/21.   Assessment & Plan     Annual wellness visit done today including the all of the following: Reviewed patient's Family Medical History Reviewed and updated list of patient's medical providers Assessment of cognitive impairment was done Assessed patient's functional ability Established a written schedule for health screening [place] Completed and Reviewed  Exercise Activities  and Dietary recommendations  Goals      Monitor and Manage My Blood Sugar-Diabetes Type 2     Timeframe:  Long-Range Goal Priority:  High Start Date:   10/13/2020                          Expected End Date: 04/15/2022                      Follow Up Date 06/10/2021    - check blood sugar once daily before breakfast - check blood sugar if I feel it is too high or too low - enter blood sugar readings and medication or insulin into daily log    Why is this important?   Checking your blood sugar at home helps to keep it from getting very high or very low.  Writing the results in a diary or log helps the doctor know how to care for you.  Your blood sugar log should have the time, date and the results.  Also, write down the amount of insulin or other medicine that you take.  Other information, like what you ate, exercise done and how you were feeling, will also be helpful.     Notes:         Immunization History  Administered Date(s) Administered   Influenza Inj Mdck Quad Pf 05/27/2018   Influenza Split 04/22/2012   Influenza, High Dose Seasonal PF 05/12/2020   Influenza,inj,Quad PF,6+ Mos 05/04/2013, 04/28/2014, 04/20/2015, 05/05/2017, 05/24/2019   PFIZER(Purple Top)SARS-COV-2 Vaccination 09/25/2019, 10/16/2019, 07/13/2020   Pneumococcal Polysaccharide-23 06/28/2010, 02/03/2020   Td 01/14/2019   Tdap 10/30/2007   Zoster, Live 03/12/2016    Health Maintenance  Topic Date Due   FOOT EXAM  Never done   Zoster Vaccines- Shingrix (1 of 2) Never done   COLONOSCOPY (Pts 45-74yr  Insurance coverage will need to be confirmed)  10/02/2020   COVID-19 Vaccine (4 - Booster for Pfizer series) 10/05/2020   INFLUENZA VACCINE  03/12/2021   OPHTHALMOLOGY EXAM  06/13/2021   HEMOGLOBIN A1C  06/16/2021   TETANUS/TDAP  01/13/2029   Hepatitis C Screening  Completed   HPV VACCINES  Aged Out     Discussed health benefits of physical activity, and encouraged him to engage in regular exercise appropriate for his age and condition.   1. Medicare annual wellness visit, subsequent  - Lipid panel - TSH - CBC w/Diff/Platelet - Comprehensive Metabolic Panel (CMET) - Hemoglobin A1c  2. Annual physical exam   3. Type 2 diabetes mellitus with other specified complication, without long-term current use of insulin (HCC)  - Lipid panel - TSH - CBC w/Diff/Platelet - Comprehensive Metabolic Panel (CMET) - Hemoglobin A1c  4. Essential hypertension  - Lipid panel - TSH - CBC w/Diff/Platelet - Comprehensive Metabolic Panel (CMET) - Hemoglobin A1c  5. Mixed hyperlipidemia  - Lipid panel - TSH - CBC w/Diff/Platelet - Comprehensive Metabolic Panel (CMET) - Hemoglobin A1c  6. Gastroesophageal reflux disease, unspecified whether esophagitis present  - Lipid panel - TSH - CBC w/Diff/Platelet - Comprehensive Metabolic Panel (CMET) - Hemoglobin A1c  7. Need for pneumococcal vaccination  - Pneumococcal conjugate vaccine 20-valent (Prevnar 20)  8. Need for influenza vaccination  - Flu Vaccine QUAD High Dose(Fluad)  9. Screening for colon cancer Time for follow-up colonoscopy. - Ambulatory referral to Gastroenterology     Return in about 5 months (around 10/07/2021).     I, RWilhemena Durie MD, have  reviewed all documentation for this visit. The documentation on 05/10/21 for the exam, diagnosis, procedures, and orders are all accurate and complete.    Maleny Candy Cranford Mon, MD  [facility] [phone] (phone) [phone] (fax)  [place]

## 2021-05-08 LAB — CBC WITH DIFFERENTIAL/PLATELET
Basophils Absolute: 0.1 10*3/uL (ref 0.0–0.2)
Basos: 1 %
EOS (ABSOLUTE): 0.2 10*3/uL (ref 0.0–0.4)
Eos: 2 %
Hematocrit: 45.3 % (ref 37.5–51.0)
Hemoglobin: 15 g/dL (ref 13.0–17.7)
Immature Grans (Abs): 0.1 10*3/uL (ref 0.0–0.1)
Immature Granulocytes: 1 %
Lymphocytes Absolute: 2.7 10*3/uL (ref 0.7–3.1)
Lymphs: 36 %
MCH: 27.8 pg (ref 26.6–33.0)
MCHC: 33.1 g/dL (ref 31.5–35.7)
MCV: 84 fL (ref 79–97)
Monocytes Absolute: 0.6 10*3/uL (ref 0.1–0.9)
Monocytes: 9 %
Neutrophils Absolute: 3.9 10*3/uL (ref 1.4–7.0)
Neutrophils: 51 %
Platelets: 236 10*3/uL (ref 150–450)
RBC: 5.39 x10E6/uL (ref 4.14–5.80)
RDW: 14.1 % (ref 11.6–15.4)
WBC: 7.5 10*3/uL (ref 3.4–10.8)

## 2021-05-08 LAB — LIPID PANEL
Chol/HDL Ratio: 5.4 ratio — ABNORMAL HIGH (ref 0.0–5.0)
Cholesterol, Total: 146 mg/dL (ref 100–199)
HDL: 27 mg/dL — ABNORMAL LOW (ref >39)
LDL Chol Calc (NIH): 45 mg/dL (ref 0–99)
Triglycerides: 512 mg/dL — ABNORMAL HIGH (ref 0–149)
VLDL Cholesterol Cal: 74 mg/dL — ABNORMAL HIGH (ref 5–40)

## 2021-05-08 LAB — COMPREHENSIVE METABOLIC PANEL
ALT: 24 IU/L (ref 0–44)
AST: 19 IU/L (ref 0–40)
Albumin/Globulin Ratio: 1.6 (ref 1.2–2.2)
Albumin: 4.3 g/dL (ref 3.8–4.8)
Alkaline Phosphatase: 59 IU/L (ref 44–121)
BUN/Creatinine Ratio: 14 (ref 10–24)
BUN: 15 mg/dL (ref 8–27)
Bilirubin Total: 0.3 mg/dL (ref 0.0–1.2)
CO2: 23 mmol/L (ref 20–29)
Calcium: 9.6 mg/dL (ref 8.6–10.2)
Chloride: 102 mmol/L (ref 96–106)
Creatinine, Ser: 1.07 mg/dL (ref 0.76–1.27)
Globulin, Total: 2.7 g/dL (ref 1.5–4.5)
Glucose: 205 mg/dL — ABNORMAL HIGH (ref 70–99)
Potassium: 4.6 mmol/L (ref 3.5–5.2)
Sodium: 139 mmol/L (ref 134–144)
Total Protein: 7 g/dL (ref 6.0–8.5)
eGFR: 77 mL/min/{1.73_m2} (ref >59)

## 2021-05-08 LAB — HEMOGLOBIN A1C
Est. average glucose Bld gHb Est-mCnc: 192 mg/dL
Hgb A1c MFr Bld: 8.3 % — ABNORMAL HIGH (ref 4.8–5.6)

## 2021-05-08 LAB — TSH: TSH: 1.66 u[IU]/mL (ref 0.450–4.500)

## 2021-05-09 ENCOUNTER — Telehealth: Payer: Self-pay

## 2021-05-09 NOTE — Progress Notes (Signed)
    Chronic Care Management Pharmacy Assistant   Name: Jeffrey [name]  MRN: [id number] DOB: 1955-09-28  Reason for Encounter: Medication Review/General Adherence Call.   Recent office visits:  05/07/2021 Dr.Gilbert MD (PCP) Medical annual Wellness completed, Ambulatory referral to Gastroenterology  Recent consult visits:  No recent [facility] visits:  None in previous 6 months  Medications: Outpatient Encounter Medications as of 05/09/2021  Medication Sig   albuterol (PROVENTIL HFA;VENTOLIN HFA) 108 (90 Base) MCG/ACT inhaler Inhale 2 puffs into the lungs every 4 (four) hours as needed for wheezing or shortness of breath.   ALLERGY RELIEF 10 MG tablet TAKE 1 TABLET BY MOUTH  DAILY   amLODipine (NORVASC) 10 MG tablet TAKE 1 TABLET DAILY   aspirin 81 MG tablet Take 81 mg by mouth daily.   blood glucose meter kit and supplies Dispense based on patient and insurance preference. Use up to four times daily as directed. (FOR ICD-10 E10.9, E11.9).   CONTOUR NEXT TEST test strip CHECK BLOOD SUGAR UP TO FOUR TIMES A DAY AS DIRECTED   Dulaglutide (TRULICITY) 1.5 OZ/3.6UY SOPN INJECT THE CONTENTS OF 1 PEN (1.5 MG) UNDER THE SKIN WEEKLY AS DIRECTED   fluticasone (FLONASE) 50 MCG/ACT nasal spray Place 2 sprays into both nostrils daily.   glipiZIDE (GLUCOTROL) 10 MG tablet TAKE 1/2 TABLET BY MOUTH DAILY BEFORE BREAKFAST   losartan (COZAAR) 100 MG tablet TAKE 1 TABLET DAILY   metFORMIN (GLUCOPHAGE) 1000 MG tablet TAKE 1 TABLET TWICE A DAY WITH MEALS   Microlet Lancets MISC USE UP TO 4 TIMES DAILY AS  DIRECTED   Omega-3 Fatty Acids (FISH OIL) 1000 MG CAPS Take 2,000 mg by mouth in the morning and at bedtime.   omeprazole (PRILOSEC) 20 MG capsule TAKE 1 CAPSULE DAILY   pravastatin (PRAVACHOL) 40 MG tablet TAKE 1 TABLET DAILY AT 6 P.M.   sulfacetamide (BLEPH-10) 10 % ophthalmic solution Place 2 drops into both eyes 4 (four) times daily.   No facility-administered encounter  medications on file as of 05/09/2021.    Care Gaps: Foot Exam  Zoster Vaccines Colonoscopy (last completed 10/03/2015) COVID-19 Vaccine Booster 4  Star Rating Drug: Pravastatin 40 mg last filled on 02/26/2021 for a 90-Day supply with [facility] Trulicity 1.5 mg last filled on 02/26/2021 for a 84-Day supply with [facility] Metformin 1000 mg last filled on 02/26/2021 for a 90-Day supply with [facility] Glipizide 10 mg last filled on 02/25/2021 for a 90-Day supply with [facility] Losartan 100 mg  last filled on 02/26/2021 for a 90-Day supply with [facility] Medication Fill Gaps: None  Called patient and discussed medication adherence  with patient, no issues at this time with current medication.   Patient denies ED visit since his last CPP follow up.  Patient denies any side effects with his medication. Patient denies any problems with his current pharmacy .    Per Clinical Pharmacist, please reschedule patient office visit with me on 06/22/2021.   Patient reschedule his office appointment to 07/03/2021 at 10:00 am with the clinical pharmacist.  [name] Clinical Pharmacist Assistant [phone]

## 2021-05-21 ENCOUNTER — Telehealth: Payer: Self-pay

## 2021-05-21 NOTE — Progress Notes (Signed)
Chronic Care Management Pharmacy Assistant   Name: Jeffrey [name]  MRN: [id number] DOB: 1956-01-30  Reason for Encounter: Diabetic Disease State Call/Check Status of PAP (Trulicity)  Reached out to Jeffrey [name] to check the application status of the patients PAP, and the representative was unable to find any application on file for this patient. I sent a message to Jeffrey [name], CPP to see if he remembers the patient returning the application back to the providers office.   10/17 Spoke with Jeffrey [name], CPP about the patient's application, and he informed me that he faxed it to Jeffrey [name] last week. After speaking with Jeffrey [name] on 10/10 and 10/17 they have not received the application. I sent a message to the CPP requesting that he re-fax the application today if possible.  10/19 Reached out to Jeffrey [name] to check the application status for patient's Trulicity. Spoke with a representative and her name was Optician, dispensing. She informed me that the patient has been enrolled in the program as of 05/29/2021, and his eligibility is good until 08/11/2022. Patient medication is being processed and he is on automatic shipment. The pharmacy will reach out to the patient directly regarding shipment.   I spoke to the patient, and provided him with the update of his application, and to inform him someone from Jeffrey [name] should be contacting him soon regarding his first shipment. I encouraged patient that if he has not heard anything by the beginning of next week to give me a call so I can check status again. Patient verbalized understanding to all.     Recent office visits:  None ID  Recent consult visits:  None ID  Hospital visits:  None in previous 6 months  Medications: Outpatient Encounter Medications as of 05/21/2021  Medication Sig   albuterol (PROVENTIL HFA;VENTOLIN HFA) 108 (90 Base) MCG/ACT inhaler Inhale 2 puffs into the lungs every 4 (four) hours as needed for  wheezing or shortness of breath.   ALLERGY RELIEF 10 MG tablet TAKE 1 TABLET BY MOUTH  DAILY   amLODipine (NORVASC) 10 MG tablet TAKE 1 TABLET DAILY   aspirin 81 MG tablet Take 81 mg by mouth daily.   blood glucose meter kit and supplies Dispense based on patient and insurance preference. Use up to four times daily as directed. (FOR ICD-10 E10.9, E11.9).   CONTOUR NEXT TEST test strip CHECK BLOOD SUGAR UP TO FOUR TIMES A DAY AS DIRECTED   Dulaglutide (TRULICITY) 1.5 RW/4.3XV SOPN INJECT THE CONTENTS OF 1 PEN (1.5 MG) UNDER THE SKIN WEEKLY AS DIRECTED   fluticasone (FLONASE) 50 MCG/ACT nasal spray Place 2 sprays into both nostrils daily.   glipiZIDE (GLUCOTROL) 10 MG tablet TAKE 1/2 TABLET BY MOUTH DAILY BEFORE BREAKFAST   losartan (COZAAR) 100 MG tablet TAKE 1 TABLET DAILY   metFORMIN (GLUCOPHAGE) 1000 MG tablet TAKE 1 TABLET TWICE A DAY WITH MEALS   Microlet Lancets MISC USE UP TO 4 TIMES DAILY AS  DIRECTED   Omega-3 Fatty Acids (FISH OIL) 1000 MG CAPS Take 2,000 mg by mouth in the morning and at bedtime.   omeprazole (PRILOSEC) 20 MG capsule TAKE 1 CAPSULE DAILY   pravastatin (PRAVACHOL) 40 MG tablet TAKE 1 TABLET DAILY AT 6 P.M.   sulfacetamide (BLEPH-10) 10 % ophthalmic solution Place 2 drops into both eyes 4 (four) times daily.   No facility-administered encounter medications on file as of 05/21/2021.   Care Gaps: Diabetic Foot Exam Zoster Vaccines Colonoscopy (Last completed  10/03/2015) [employer] only covers every 10 years unless patient is high risk COVID-19 Vaccine Booster 4  Star Rating Drugs: Pravastatin 40 mg  last filled on 02/26/2021 for an 84-Day supply with [facility] Trulicity 1.5 mg last filled on 02/26/2021 for an 84-Day supply with [facility] Metformin 1000 mg  last filled on 02/26/2021 for an 84-Day supply with [facility] Glipizide 10 mg last filled on 04/13/2021 for a 90-Day supply with [facility]  Recent Relevant Labs: Lab Results  Component  Value Date/Time   HGBA1C 8.3 (H) 05/07/2021 10:37 AM   HGBA1C 7.0 (A) 12/14/2020 11:18 AM   HGBA1C 7.3 (H) 07/13/2020 09:03 AM   MICROALBUR 50 07/13/2020 08:58 AM   MICROALBUR 20 12/10/2016 04:56 PM    Kidney Function Lab Results  Component Value Date/Time   CREATININE 1.07 05/07/2021 10:37 AM   CREATININE 1.32 (H) 02/29/2020 10:59 AM   GFRNONAA 56 (L) 02/29/2020 10:59 AM   GFRAA 65 02/29/2020 10:59 AM    Current antihyperglycemic regimen:  Glipizide 10 mg 1/2 tablet daily  Metformin 1000 mg twice daily  Trulicity 1.5 mg weekly   What recent interventions/DTPs have been made to improve glycemic control:  None ID  Have there been any recent hospitalizations or ED visits since last visit with CPP? No  Patient denies hypoglycemic symptoms, including Pale, Sweaty, Shaky, Hungry, Nervous/irritable, and Vision changes  Patient denies hyperglycemic symptoms, including blurry vision, excessive thirst, fatigue, polyuria, and weakness  How often are you checking your blood sugar? once daily  What are your blood sugars ranging?  Fasting: between 200-230 patient states that by the end of the day the number has dropped, but he never has it drop below 100   During the week, how often does your blood glucose drop below 70? Never  Are you checking your feet daily/regularly? Patient reports no issues currently with his feet.  Adherence Review: Is the patient currently on a STATIN medication? Yes Is the patient currently on ACE/ARB medication? No Does the patient have >5 day gap between last estimated fill dates? No  Spoke with the patient, and informed him that I am still in the process of working on his Trulicity application, and as of right now according to Jeffrey [name] they still have not received the application. I informed the patient that I sent a message to Jeffrey [name] - [facility] requesting he refax it today if possible. Patient reports that he is okay for now on the medication, and he is going to  order his refill today as he is almost out at home. I informed him as soon as I have an update I will give him a call. Patient verbalized understanding.   Patient has a scheduled visit with Jeffrey [name], CPP on 07/03/2021 @ [place], CPA/CMA Clinical Production designer, theatre/television/film Phone: [phone]

## 2021-06-05 ENCOUNTER — Telehealth: Payer: Self-pay

## 2021-06-05 NOTE — Telephone Encounter (Signed)
Copied from [place] [phone]. Topic: General - Other >> Jun 05, 2021  2:45 PM Jeffrey [name] A wrote: Reason for CRM: The patient has returned a missed call from the practice  Please contact again when possible

## 2021-06-13 DIAGNOSIS — S46912A Strain of unspecified muscle, fascia and tendon at shoulder and upper arm level, left arm, initial encounter: Secondary | ICD-10-CM | POA: Diagnosis not present

## 2021-06-13 DIAGNOSIS — R52 Pain, unspecified: Secondary | ICD-10-CM | POA: Insufficient documentation

## 2021-06-13 DIAGNOSIS — M25512 Pain in left shoulder: Secondary | ICD-10-CM | POA: Diagnosis not present

## 2021-06-13 DIAGNOSIS — M7542 Impingement syndrome of left shoulder: Secondary | ICD-10-CM | POA: Insufficient documentation

## 2021-06-14 ENCOUNTER — Telehealth: Payer: Self-pay

## 2021-06-14 NOTE — Progress Notes (Signed)
Per Clinical pharmacist, please reschedule patient appointment to a different time on 06/23/2021.  Patient reschedule office to 07/03/2021 at 11:00 am.  [place] Pharmacist Assistant [phone]

## 2021-06-22 ENCOUNTER — Ambulatory Visit: Payer: Medicare Other

## 2021-07-02 ENCOUNTER — Other Ambulatory Visit: Payer: Self-pay | Admitting: Family Medicine

## 2021-07-02 ENCOUNTER — Telehealth: Payer: Self-pay

## 2021-07-02 DIAGNOSIS — E1169 Type 2 diabetes mellitus with other specified complication: Secondary | ICD-10-CM

## 2021-07-02 DIAGNOSIS — E119 Type 2 diabetes mellitus without complications: Secondary | ICD-10-CM | POA: Diagnosis not present

## 2021-07-02 LAB — HM DIABETES EYE EXAM

## 2021-07-02 NOTE — Progress Notes (Signed)
    Chronic Care Management Pharmacy Assistant   Name: Jeffrey [name]  MRN: [id number] DOB: [birth year]  Patient called to be reminded of his face to face appointment with [name], CPP on 11/22 @ 1100  Patient aware of appointment date, time, and type of appointment (either telephone or in person). Patient aware to have/bring all medications, supplements, blood pressure and/or blood sugar logs to visit.  Questions: Are there any concerns you would like to discuss during your office visit? Patient was coughing so I didn't get a chance to ask  Are you having any problems obtaining your medications? Not sure I wasn't able to ask patient was coughing sounded sick  Star Rating Drug: Pravastatin 40 mg last filled on 03/01/2021 for a 90-Day supply with [facility]- 3 Refills left Trulicity 1.5 mg last filled on 03/02/2021 for a 84-Day supply with [facility]- 3 Refills left Metformin 1000 mg last filled on 03/01/2021 for a 90-Day supply with [facility]- 0 Refills Losartan 100 mg last filled on 03/02/2021 for a 90-Day supply with [facility]- 1 Refill left Glipizide 10 mg last filled on 04/13/2021 for a 90-Day supply with [facility]  Any gaps in medications fill history? Yes, according to [facility] the above dates were the last time the patient filled the medication with them.   Care Gaps: Diabetic Foot Exam COVID-19 Booster 4 Colonoscopy- (Typically insurance will only allow every 10 years unless patient is high risk) last completed 09/29/2015 Diabetic Eye Exam (Last completed 06/13/2020)   Lynann Bologna, CPA/CMA Clinical Pharmacist Assistant Phone: [phone]

## 2021-07-03 ENCOUNTER — Ambulatory Visit: Payer: Medicare Other

## 2021-07-03 ENCOUNTER — Ambulatory Visit (INDEPENDENT_AMBULATORY_CARE_PROVIDER_SITE_OTHER): Payer: Medicare Other

## 2021-07-03 VITALS — BP 132/67 | HR 71

## 2021-07-03 DIAGNOSIS — E1165 Type 2 diabetes mellitus with hyperglycemia: Secondary | ICD-10-CM

## 2021-07-03 DIAGNOSIS — I10 Essential (primary) hypertension: Secondary | ICD-10-CM

## 2021-07-03 DIAGNOSIS — E781 Pure hyperglyceridemia: Secondary | ICD-10-CM

## 2021-07-03 MED ORDER — OMEGA-3-ACID ETHYL ESTERS 1 G PO CAPS: 1.0000 g | ORAL_CAPSULE | Freq: Two times a day (BID) | ORAL | 1 refills | Status: DC

## 2021-07-03 NOTE — Patient Instructions (Addendum)
Visit Information It was great speaking with you today!  Please let me know if you have any questions about our visit.   Goals Addressed             This Visit's Progress    Monitor and Manage My Blood Sugar-Diabetes Type 2   On track    Timeframe:  Long-Range Goal Priority:  High Start Date:   10/13/2020                          Expected End Date: 04/15/2022                      Follow Up within 60 days    - check blood sugar once daily before breakfast - check blood sugar if I feel it is too high or too low - enter blood sugar readings and medication or insulin into daily log    Why is this important?   Checking your blood sugar at home helps to keep it from getting very high or very low.  Writing the results in a diary or log helps the doctor know how to care for you.  Your blood sugar log should have the time, date and the results.  Also, write down the amount of insulin or other medicine that you take.  Other information, like what you ate, exercise done and how you were feeling, will also be helpful.     Notes:         Patient Care Plan: General Pharmacy (Adult)     Problem Identified: Hypertension, Hyperlipidemia, Diabetes and GERD   Priority: High     Long-Range Goal: Patient-Specific Goal   Start Date: 10/13/2020  Expected End Date: 04/13/2022  This Visit's Progress: On track  Recent Progress: On track  Priority: High  Note:   Current Barriers:  Suboptimal therapeutic regimen for cholesterol  Pharmacist Clinical Goal(s):  Over the next 90 days, patient will achieve control of cholesterol as evidenced by LDL less than 70, triglycerides less than 150 maintain control of diabetes as evidenced by A1c less than 7.5%  through collaboration with PharmD and provider.   Interventions: 1:1 collaboration with Jerrol Banana., MD regarding development and update of comprehensive plan of care as evidenced by provider attestation and co-signature Inter-disciplinary  care team collaboration (see longitudinal plan of care) Comprehensive medication review performed; medication list updated in electronic medical record  Hypertension (BP goal <130/80) -Controlled -Current treatment: Amlodipine 10 mg daily  Losartan 100 mg daily  -Medications previously tried: NA  -Current home readings: 150s-160s/70-80s   -Denies hypotensive/hypertensive symptoms -Counseled to monitor BP at home twice weekly, document, and provide log at future appointments. Counseled on proper monitoring technique. -Recommended to continue current medication  Hyperlipidemia: (LDL goal < 70) -Uncontrolled -Current treatment:  Pravastatin 40 mg nightly  -Medications previously tried: NA  -START Lovaza 1g twice daily   Diabetes (A1c goal <7.5%) -Diagnosed 1998 -Uncontrolled -Current medications: Glipizide 10 mg 1/2 tablet daily  Metformin 1000 mg twice daily  Trulicity 1.5 mg weekly  -Medications previously tried: NA  -Reports some GI upset with metformin, but tolerable. Did not want to switch to ER formulation today.  -Current home glucose readings fasting glucose: 180-210s  -Denies hypoglycemic/hyperglycemic symptoms -Current meal patterns: Continues to snack late at night on Cheese + Crackers OR PB&J sandwiches -Patient prefers to continue on same dose of Trulicity at this time.   GERD (Goal:  Prevent heartburn or reflux ) -Controlled -Current treatment  Omeprazole 20 mg daily -Medications previously tried: NA  -Recommended to continue current medication  Patient Goals/Self-Care Activities Over the next 90 days, patient will:  - check glucose twice daily, before breakfast and at bedtime, document, and provide at future appointments check blood pressure twice weekly, document, and provide at future appointments  Follow Up Plan: Telephone follow up appointment with care management team member scheduled for:  08/14/2021 at 11:00 AM      Patient agreed to services and  verbal consent obtained.   Print copy of patient instructions, educational materials, and care plan provided in person.  [name], PharmD, Para March, CPP  Clinical Pharmacist Practitioner  [facility] [phone]

## 2021-07-03 NOTE — Progress Notes (Signed)
Chronic Care Management Pharmacy Note  07/03/2021 Name:  Jeffrey [name] MRN:  [id number] DOB:  05/15/1956  Summary: Patient presents for CCM follow-up. Home blood pressures have been elevated but well controlled in clinic today. Discussed dietary and exercise changes in depth.  Recommendations/Changes made from today's visit: START Lovaza 1g twice daily   Plan: CPP follow-up in 6 weeks   Subjective: Jeffrey [name] is an 65 y.o. year old male who is a primary patient of Jerrol Banana., MD.  The CCM team was consulted for assistance with disease management and care coordination needs.    Engaged with patient by telephone for follow up visit in response to provider referral for pharmacy case management and/or care coordination services.   Consent to Services:  The patient was given information about Chronic Care Management services, agreed to services, and gave verbal consent prior to initiation of services.  Please see initial visit note for detailed documentation.   Patient Care Team: Jerrol Banana., MD as PCP - General (Family Medicine) Germaine Pomfret, [facility] (Pharmacist)  Recent office visits: 05/07/21: Patient presented to Dr. Rosanna Randy for follow-up. A1c worsened to 8.3%.  12/14/20: Patient presented to Dr. Rosanna Randy for follow-up. A1c improved to 7.0%. BP 152/75.   Recent consult visits: None in previous 6 months  Hospital visits: None in previous 6 months  Objective:  Lab Results  Component Value Date   CREATININE 1.07 05/07/2021   BUN 15 05/07/2021   GFRNONAA 56 (L) 02/29/2020   GFRAA 65 02/29/2020   NA 139 05/07/2021   K 4.6 05/07/2021   CALCIUM 9.6 05/07/2021   CO2 23 05/07/2021    Lab Results  Component Value Date/Time   HGBA1C 8.3 (H) 05/07/2021 10:37 AM   HGBA1C 7.0 (A) 12/14/2020 11:18 AM   HGBA1C 7.3 (H) 07/13/2020 09:03 AM   MICROALBUR 50 07/13/2020 08:58 AM   MICROALBUR 20 12/10/2016 04:56 PM    Last diabetic Eye exam:   Lab Results  Component Value Date/Time   HMDIABEYEEXA No Retinopathy 06/13/2020 12:00 AM    Last diabetic Foot exam: No results found for: HMDIABFOOTEX   Lab Results  Component Value Date   CHOL 146 05/07/2021   HDL 27 (L) 05/07/2021   LDLCALC 45 05/07/2021   TRIG 512 (H) 05/07/2021   CHOLHDL 5.4 (H) 05/07/2021    Hepatic Function Latest Ref Rng & Units 05/07/2021 02/29/2020 01/14/2019  Total Protein 6.0 - 8.5 g/dL 7.0 7.3 7.5  Albumin 3.8 - 4.8 g/dL 4.3 4.7 4.6  AST 0 - 40 IU/L _0 ALT 0 - 44 IU/L _1 Alk Phosphatase 44 - 121 IU/L 59 48 39  Total Bilirubin 0.0 - 1.2 mg/dL 0.3 0.4 0.4  Bilirubin, Direct 0.1 - 0.5 mg/dL - - -    Lab Results  Component Value Date/Time   TSH 1.660 05/07/2021 10:37 AM   TSH 1.470 02/29/2020 10:59 AM    CBC Latest Ref Rng & Units 05/07/2021 02/29/2020 01/14/2019  WBC 3.4 - 10.8 x10E3/uL 7.5 9.1 9.7  Hemoglobin 13.0 - 17.7 g/dL 15.0 14.4 15.0  Hematocrit 37.5 - 51.0 % 45.3 43.7 45.1  Platelets 150 - 450 x10E3/uL 236 274 274    No results found for: VD25OH  Clinical ASCVD: No  The 10-year ASCVD risk score (Arnett DK, et al., 2019) is: 33.3%   Values used to calculate the score:     Age: 76 years     Sex: Male  Is Non-Hispanic African American: No     Diabetic: Yes     Tobacco smoker: No     Systolic Blood Pressure: 132 mmHg     Is BP treated: Yes     HDL Cholesterol: 27 mg/dL     Total Cholesterol: 146 mg/dL    Depression screen PHQ 2/9 05/07/2021 07/13/2020 02/03/2020  Decreased Interest 0 0 0  Down, Depressed, Hopeless 0 0 0  PHQ - 2 Score 0 0 0  Altered sleeping 0 0 0  Tired, decreased energy 0 1 1  Change in appetite 2 1 1  Feeling bad or failure about yourself  0 0 0  Trouble concentrating 0 0 0  Moving slowly or fidgety/restless 0 0 0  Suicidal thoughts 0 0 0  PHQ-9 Score 2 2 2  Difficult doing work/chores Not difficult at all Somewhat difficult Not difficult at all     Social History   Tobacco Use  Smoking  Status Former   Packs/day: 1.50   Types: Cigarettes   Quit date: 08/11/1984   Years since quitting: 36.9  Smokeless Tobacco Never   BP Readings from Last 3 Encounters:  07/03/21 132/67  05/07/21 130/78  12/14/20 (!) 152/75   Pulse Readings from Last 3 Encounters:  07/03/21 71  05/07/21 72  12/14/20 67   Wt Readings from Last 3 Encounters:  05/07/21 204 lb (92.5 kg)  12/14/20 205 lb (93 kg)  02/29/20 202 lb (91.6 kg)    Assessment/Interventions: Review of patient past medical history, allergies, medications, health status, including review of consultants reports, laboratory and other test data, was performed as part of comprehensive evaluation and provision of chronic care management services.   SDOH:  (Social Determinants of Health) assessments and interventions performed: Yes SDOH Interventions    Flowsheet Row Most Recent Value  SDOH Interventions   Financial Strain Interventions Intervention Not Indicated         CCM Care Plan  Allergies  Allergen Reactions   Codeine Hives    Medications Reviewed Today     Reviewed by Miller, April M, CMA (Certified Medical Assistant) on 05/07/21 at 0952  Med List Status: <None>   Medication Order Taking? Sig Documenting Provider Last Dose Status Informant  albuterol (PROVENTIL HFA;VENTOLIN HFA) 108 (90 Base) MCG/ACT inhaler [id number] Yes Inhale 2 puffs into the lungs every 4 (four) hours as needed for wheezing or shortness of breath. [provider] Taking Active Self  ALLERGY RELIEF 10 MG tablet [id number] Yes TAKE 1 TABLET BY MOUTH  DAILY Gilbert, Richard L Jr., MD Taking Active   amLODipine (NORVASC) 10 MG tablet [id number] Yes TAKE 1 TABLET DAILY Gilbert, Richard L Jr., MD Taking Active   aspirin 81 MG tablet [id number] Yes Take 81 mg by mouth daily. [provider] Taking Active Self  blood glucose meter kit and supplies [id number] Yes Dispense based on patient and insurance preference. Use up to four times  daily as directed. (FOR ICD-10 E10.9, E11.9). Gilbert, Richard L Jr., MD Taking Active   CONTOUR NEXT TEST test strip [id number] Yes CHECK BLOOD SUGAR UP TO FOUR TIMES A DAY AS DIRECTED Gilbert, Richard L Jr., MD Taking Active   Dulaglutide (TRULICITY) 1.5 MG/0.5ML SOPN [id number] Yes INJECT THE CONTENTS OF 1 PEN (1.5 MG) UNDER THE SKIN WEEKLY AS DIRECTED Gilbert, Richard L Jr., MD Taking Active   fluticasone (FLONASE) 50 MCG/ACT nasal spray [id number] Yes Place 2 sprays into both nostrils daily. Gilbert, Richard L Jr., MD Taking Active     glipiZIDE (GLUCOTROL) 10 MG tablet [id number] Yes TAKE 1/2 TABLET BY MOUTH DAILY BEFORE BREAKFAST Jerrol Banana., MD Taking Active   losartan (COZAAR) 100 MG tablet [id number] Yes TAKE 1 TABLET DAILY Jerrol Banana., MD Taking Active   metFORMIN (GLUCOPHAGE) 1000 MG tablet [id number] Yes TAKE 1 TABLET TWICE A DAY WITH MEALS Jerrol Banana., MD Taking Active   Microlet Lancets Ashley [id number] Yes USE UP TO 4 TIMES DAILY AS  DIRECTED Jerrol Banana., MD Taking Active   Omega-3 Fatty Acids (FISH OIL) 1000 MG CAPS [id number] Yes Take 2,000 mg by mouth in the morning and at bedtime. [provider] Taking Active   omeprazole (PRILOSEC) 20 MG capsule [id number] Yes TAKE 1 CAPSULE DAILY Jerrol Banana., MD Taking Active   pravastatin (PRAVACHOL) 40 MG tablet [id number] Yes TAKE 1 TABLET DAILY AT 6 P.M. Jerrol Banana., MD Taking Active   sulfacetamide (BLEPH-10) 10 % ophthalmic solution [id number] Yes Place 2 drops into both eyes 4 (four) times daily. Carmon Ginsberg, PA Taking Active            Med Note Olevia Bowens Aug 30, 2019  3:04 PM)              Patient Active Problem List   Diagnosis Date Noted   Sinusitis 09/08/2015   Hypertriglyceridemia 07/19/2015   Pancreatitis 07/16/2015   ED (erectile dysfunction) of organic origin 04/18/2015   Hypertension 12/06/2014   Seasonal allergies 12/06/2014   Cholecystitis  12/06/2014   Diverticulosis 12/06/2014   GERD (gastroesophageal reflux disease) 12/06/2014   Diabetes mellitus ([place]) 12/06/2014   Plantar fasciitis 12/06/2014   Actinic keratosis 12/06/2014   Peyronie's disease 12/06/2014   Erectile dysfunction 12/06/2014   Obesity 12/06/2014   Hyperlipidemia 12/06/2014   Myalgia 12/06/2014    Immunization History  Administered Date(s) Administered   Fluad Quad(high Dose 65+) 05/07/2021   Influenza Inj Mdck Quad Pf 05/27/2018   Influenza Split 04/22/2012   Influenza, High Dose Seasonal PF 05/12/2020   Influenza,inj,Quad PF,6+ Mos 05/04/2013, 04/28/2014, 04/20/2015, 05/05/2017, 05/24/2019   PFIZER(Purple Top)SARS-COV-2 Vaccination 09/25/2019, 10/16/2019, 07/13/2020   PNEUMOCOCCAL CONJUGATE-20 05/07/2021   Pneumococcal Polysaccharide-23 06/28/2010, 02/03/2020   Td 01/14/2019   Tdap 10/30/2007   Zoster, Live 03/12/2016    Conditions to be addressed/monitored:  Hypertension, Hyperlipidemia, Diabetes and GERD  Care Plan : General Pharmacy (Adult)  Updates made by Germaine Pomfret, [place] since 07/03/2021 12:00 AM     Problem: Hypertension, Hyperlipidemia, Diabetes and GERD   Priority: High     Long-Range Goal: Patient-Specific Goal   Start Date: 10/13/2020  Expected End Date: 04/13/2022  This Visit's Progress: On track  Recent Progress: On track  Priority: High  Note:   Current Barriers:  Suboptimal therapeutic regimen for cholesterol  Pharmacist Clinical Goal(s):  Over the next 90 days, patient will achieve control of cholesterol as evidenced by LDL less than 70, triglycerides less than 150 maintain control of diabetes as evidenced by A1c less than 7.5%  through collaboration with PharmD and provider.   Interventions: 1:1 collaboration with Jerrol Banana., MD regarding development and update of comprehensive plan of care as evidenced by provider attestation and co-signature Inter-disciplinary care team collaboration (see  longitudinal plan of care) Comprehensive medication review performed; medication list updated in electronic medical record  Hypertension (BP goal <130/80) -Controlled -Current treatment: Amlodipine 10 mg daily  Losartan 100 mg daily  -Medications previously tried: NA  -  Current home readings: 150s-160s/70-80s   -Denies hypotensive/hypertensive symptoms -Counseled to monitor BP at home twice weekly, document, and provide log at future appointments. Counseled on proper monitoring technique. -Recommended to continue current medication  Hyperlipidemia: (LDL goal < 70) -Uncontrolled -Current treatment:  Pravastatin 40 mg nightly  -Medications previously tried: NA  -START Lovaza 1g twice daily   Diabetes (A1c goal <7.5%) -Diagnosed 1998 -Uncontrolled -Current medications: Glipizide 10 mg 1/2 tablet daily  Metformin 1000 mg twice daily  Trulicity 1.5 mg weekly  -Medications previously tried: NA  -Reports some GI upset with metformin, but tolerable. Did not want to switch to ER formulation today.  -Current home glucose readings fasting glucose: 180-210s  -Denies hypoglycemic/hyperglycemic symptoms -Current meal patterns: Continues to snack late at night on Cheese + Crackers OR PB&J sandwiches -Patient prefers to continue on same dose of Trulicity at this time.   GERD (Goal: Prevent heartburn or reflux ) -Controlled -Current treatment  Omeprazole 20 mg daily -Medications previously tried: NA  -Recommended to continue current medication  Patient Goals/Self-Care Activities Over the next 90 days, patient will:  - check glucose twice daily, before breakfast and at bedtime, document, and provide at future appointments check blood pressure twice weekly, document, and provide at future appointments  Follow Up Plan: Telephone follow up appointment with care management team member scheduled for:  08/14/2021 at 11:00 AM    Medication Assistance:  Trulicity obtained through [employer]  medication assistance program.  Enrollment ends Dec 2023  Patient's preferred pharmacy is:  [id number] [place], [place] [address]Phone: [phone]: [phone] [facility] [place]Phone: [phone]: [phone] Uses pill box? Yes Pt endorses 100% compliance  We discussed: Current pharmacy is preferred with insurance plan and patient is satisfied with pharmacy services Patient decided to: Continue current medication management strategy  Care Plan and Follow Up Patient Decision:  Patient agrees to Care Plan and Follow-up.  Plan: Telephone follow up appointment with care management team member scheduled for:  08/14/2021 at 11:00 AM  [phone]

## 2021-07-03 NOTE — Telephone Encounter (Signed)
Requested Prescriptions  Pending Prescriptions Disp Refills  . metFORMIN (GLUCOPHAGE) 1000 MG tablet [Pharmacy Med Name: METFORMIN HCL TABS 1000MG] 180 tablet 3    Sig: TAKE 1 TABLET TWICE A DAY WITH MEALS     Endocrinology:  Diabetes - Biguanides Failed - 07/02/2021  8:40 PM      Failed - HBA1C is between 0 and 7.9 and within 180 days    Hgb A1c MFr Bld  Date Value Ref Range Status  05/07/2021 8.3 (H) 4.8 - 5.6 % Final    Comment:             Prediabetes: 5.7 - 6.4          Diabetes: >6.4          Glycemic control for adults with diabetes: <7.0          Failed - Valid encounter within last 6 months    Recent Outpatient Visits          1 month ago Medicare annual wellness visit, subsequent   [facility] Gilbert, Richard L Jr., MD   6 months ago Type 2 diabetes mellitus with other specified complication, without long-term current use of insulin (HCC)   [facility] Gilbert, Richard L Jr., MD   11 months ago Type 2 diabetes mellitus with hyperglycemia, without long-term current use of insulin (HCC)   [facility] Gilbert, Richard L Jr., MD   1 year ago Type 2 diabetes mellitus with hyperglycemia, without long-term current use of insulin (HCC)   [facility] Gilbert, Richard L Jr., MD   1 year ago Welcome to Medicare preventive visit   [facility] Gilbert, Richard L Jr., MD      Future Appointments            In 3 months Gilbert, Richard L Jr., MD [facility]           Passed - Cr in normal range and within 360 days    Creatinine, Ser  Date Value Ref Range Status  05/07/2021 1.07 0.76 - 1.27 mg/dL Final         Passed - eGFR in normal range and within 360 days    GFR calc Af Amer  Date Value Ref Range Status  02/29/2020 65 >59 mL/min/1.73 Final    Comment:    **[facility] currently reports eGFR in compliance with the current**   recommendations of the National Kid[employer] Foundation.  [facility] will   update reporting as new guidelines are published from the NKF-ASN   Task force.    GFR calc non Af Amer  Date Value Ref Range Status  02/29/2020 56 (L) >59 mL/min/1.73 Final   eGFR  Date Value Ref Range Status  05/07/2021 77 >59 mL/min/1.73 Final           

## 2021-07-11 DIAGNOSIS — E781 Pure hyperglyceridemia: Secondary | ICD-10-CM | POA: Diagnosis not present

## 2021-07-11 DIAGNOSIS — Z87891 Personal history of nicotine dependence: Secondary | ICD-10-CM

## 2021-07-11 DIAGNOSIS — Z7985 Long-term (current) use of injectable non-insulin antidiabetic drugs: Secondary | ICD-10-CM

## 2021-07-11 DIAGNOSIS — I10 Essential (primary) hypertension: Secondary | ICD-10-CM

## 2021-07-11 DIAGNOSIS — E1165 Type 2 diabetes mellitus with hyperglycemia: Secondary | ICD-10-CM

## 2021-07-11 DIAGNOSIS — Z7984 Long term (current) use of oral hypoglycemic drugs: Secondary | ICD-10-CM

## 2021-08-14 ENCOUNTER — Telehealth: Payer: Self-pay

## 2021-08-14 ENCOUNTER — Telehealth: Payer: Medicare Other

## 2021-08-14 NOTE — Progress Notes (Deleted)
Chronic Care Management Pharmacy Note  08/14/2021 Name:  Jeffrey [name] MRN:  [id number] DOB:  10-30-1955  Summary: Patient presents for CCM follow-up. Home blood pressures have been elevated but well controlled in clinic today. Discussed dietary and exercise changes in depth.  Recommendations/Changes made from today's visit: START Lovaza 1g twice daily   Plan: CPP follow-up in 6 weeks   Subjective: Jeffrey [name] is an 65 y.o. year old male who is a primary patient of Jerrol Banana., MD.  The CCM team was consulted for assistance with disease management and care coordination needs.    Engaged with patient by telephone for follow up visit in response to provider referral for pharmacy case management and/or care coordination services.   Consent to Services:  The patient was given information about Chronic Care Management services, agreed to services, and gave verbal consent prior to initiation of services.  Please see initial visit note for detailed documentation.   Patient Care Team: Jerrol Banana., MD as PCP - General (Family Medicine) Germaine Pomfret, [facility] (Pharmacist)  Recent office visits: 05/07/21: Patient presented to Dr. Rosanna Randy for follow-up. A1c worsened to 8.3%.  12/14/20: Patient presented to Dr. Rosanna Randy for follow-up. A1c improved to 7.0%. BP 152/75.   Recent consult visits: None in previous 6 months  Hospital visits: None in previous 6 months  Objective:  Lab Results  Component Value Date   CREATININE 1.07 05/07/2021   BUN 15 05/07/2021   GFRNONAA 56 (L) 02/29/2020   GFRAA 65 02/29/2020   NA 139 05/07/2021   K 4.6 05/07/2021   CALCIUM 9.6 05/07/2021   CO2 23 05/07/2021    Lab Results  Component Value Date/Time   HGBA1C 8.3 (H) 05/07/2021 10:37 AM   HGBA1C 7.0 (A) 12/14/2020 11:18 AM   HGBA1C 7.3 (H) 07/13/2020 09:03 AM   MICROALBUR 50 07/13/2020 08:58 AM   MICROALBUR 20 12/10/2016 04:56 PM    Last diabetic Eye exam:   Lab Results  Component Value Date/Time   HMDIABEYEEXA No Retinopathy 06/13/2020 12:00 AM    Last diabetic Foot exam: No results found for: HMDIABFOOTEX   Lab Results  Component Value Date   CHOL 146 05/07/2021   HDL 27 (L) 05/07/2021   LDLCALC 45 05/07/2021   TRIG 512 (H) 05/07/2021   CHOLHDL 5.4 (H) 05/07/2021    Hepatic Function Latest Ref Rng & Units 05/07/2021 02/29/2020 01/14/2019  Total Protein 6.0 - 8.5 g/dL 7.0 7.3 7.5  Albumin 3.8 - 4.8 g/dL 4.3 4.7 4.6  AST 0 - 40 IU/L 19 22 22   ALT 0 - 44 IU/L 24 29 24   Alk Phosphatase 44 - 121 IU/L 59 48 39  Total Bilirubin 0.0 - 1.2 mg/dL 0.3 0.4 0.4  Bilirubin, Direct 0.1 - 0.5 mg/dL - - -    Lab Results  Component Value Date/Time   TSH 1.660 05/07/2021 10:37 AM   TSH 1.470 02/29/2020 10:59 AM    CBC Latest Ref Rng & Units 05/07/2021 02/29/2020 01/14/2019  WBC 3.4 - 10.8 x10E3/uL 7.5 9.1 9.7  Hemoglobin 13.0 - 17.7 g/dL 15.0 14.4 15.0  Hematocrit 37.5 - 51.0 % 45.3 43.7 45.1  Platelets 150 - 450 x10E3/uL 236 274 274    No results found for: VD25OH  Clinical ASCVD: No  The 10-year ASCVD risk score (Arnett DK, et al., 2019) is: 33.3%   Values used to calculate the score:     Age: 40 years     Sex: Male  Is Non-Hispanic African American: No     Diabetic: Yes     Tobacco smoker: No     Systolic Blood Pressure: 867 mmHg     Is BP treated: Yes     HDL Cholesterol: 27 mg/dL     Total Cholesterol: 146 mg/dL    Depression screen [facility] 2/9 05/07/2021 07/13/2020 02/03/2020  Decreased Interest 0 0 0  Down, Depressed, Hopeless 0 0 0  PHQ - 2 Score 0 0 0  Altered sleeping 0 0 0  Tired, decreased energy 0 1 1  Change in appetite 2 1 1   Feeling bad or failure about yourself  0 0 0  Trouble concentrating 0 0 0  Moving slowly or fidgety/restless 0 0 0  Suicidal thoughts 0 0 0  PHQ-9 Score 2 2 2   Difficult doing work/chores Not difficult at all Somewhat difficult Not difficult at all     Social History   Tobacco Use  Smoking  Status Former   Packs/day: 1.50   Types: Cigarettes   Quit date: 08/11/1984   Years since quitting: 37.0  Smokeless Tobacco Never   BP Readings from Last 3 Encounters:  07/03/21 132/67  05/07/21 130/78  12/14/20 (!) 152/75   Pulse Readings from Last 3 Encounters:  07/03/21 71  05/07/21 72  12/14/20 67   Wt Readings from Last 3 Encounters:  05/07/21 204 lb (92.5 kg)  12/14/20 205 lb (93 kg)  02/29/20 202 lb (91.6 kg)    Assessment/Interventions: Review of patient past medical history, allergies, medications, health status, including review of consultants reports, laboratory and other test data, was performed as part of comprehensive evaluation and provision of chronic care management services.   SDOH:  (Social Determinants of Health) assessments and interventions performed: Yes      CCM Care Plan  Allergies  Allergen Reactions   Codeine Hives    Medications Reviewed Today     Reviewed by Julieta Bellini, CMA (Certified Medical Assistant) on 05/07/21 at [phone]  Med List Status: <None>   Medication Order Taking? Sig Documenting Provider Last Dose Status Informant  albuterol (PROVENTIL HFA;VENTOLIN HFA) 108 (90 Base) MCG/ACT inhaler [id number] Yes Inhale 2 puffs into the lungs every 4 (four) hours as needed for wheezing or shortness of breath. [provider] Taking Active Self  ALLERGY RELIEF 10 MG tablet [id number] Yes TAKE 1 TABLET BY MOUTH  DAILY Jerrol Banana., MD Taking Active   amLODipine [facility]) 10 MG tablet [id number] Yes TAKE 1 TABLET DAILY Jerrol Banana., MD Taking Active   aspirin 81 MG tablet [id number] Yes Take 81 mg by mouth daily. [provider] Taking Active Self  blood glucose meter kit and supplies [id number] Yes Dispense based on patient and insurance preference. Use up to four times daily as directed. (FOR ICD-10 E10.9, E11.9). Jerrol Banana., MD Taking Active   CONTOUR NEXT TEST test strip [id number] Yes CHECK  BLOOD SUGAR UP TO FOUR TIMES A DAY AS DIRECTED Jerrol Banana., MD Taking Active   Dulaglutide (TRULICITY) 1.5 DQ/2.2WL [facility] [id number] Yes INJECT THE CONTENTS OF 1 PEN (1.5 MG) UNDER THE SKIN WEEKLY AS DIRECTED Jerrol Banana., MD Taking Active   fluticasone [facility]) 50 MCG/ACT nasal spray [id number] Yes Place 2 sprays into both nostrils daily. Jerrol Banana., MD Taking Active   glipiZIDE (GLUCOTROL) 10 MG tablet [id number] Yes TAKE 1/2 TABLET BY MOUTH DAILY BEFORE BREAKFAST Jerrol Banana., MD Taking Active  losartan (COZAAR) 100 MG tablet [id number] Yes TAKE 1 TABLET DAILY Jerrol Banana., MD Taking Active   metFORMIN (GLUCOPHAGE) 1000 MG tablet [id number] Yes TAKE 1 TABLET TWICE A DAY WITH MEALS Jerrol Banana., MD Taking Active   Microlet Lancets MISC [id number] Yes USE UP TO 4 TIMES DAILY AS  DIRECTED Jerrol Banana., MD Taking Active   Omega-3 Fatty Acids (FISH OIL) 1000 MG CAPS [id number] Yes Take 2,000 mg by mouth in the morning and at bedtime. [provider] Taking Active   omeprazole (PRILOSEC) 20 MG capsule [id number] Yes TAKE 1 CAPSULE DAILY Jerrol Banana., MD Taking Active   pravastatin (PRAVACHOL) 40 MG tablet [id number] Yes TAKE 1 TABLET DAILY AT 6 P.M. Jerrol Banana., MD Taking Active   sulfacetamide (BLEPH-10) 10 % ophthalmic solution [id number] Yes Place 2 drops into both eyes 4 (four) times daily. Carmon Ginsberg, PA Taking Active            Med Note Olevia Bowens Aug 30, 2019  3:04 PM)              Patient Active Problem List   Diagnosis Date Noted   Sinusitis 09/08/2015   Hypertriglyceridemia 07/19/2015   Pancreatitis 07/16/2015   ED (erectile dysfunction) of organic origin 04/18/2015   Hypertension 12/06/2014   Seasonal allergies 12/06/2014   Cholecystitis 12/06/2014   Diverticulosis 12/06/2014   GERD (gastroesophageal reflux disease) 12/06/2014   Diabetes mellitus ([place]) 12/06/2014    Plantar fasciitis 12/06/2014   Actinic keratosis 12/06/2014   Peyronie's disease 12/06/2014   Erectile dysfunction 12/06/2014   Obesity 12/06/2014   Hyperlipidemia 12/06/2014   Myalgia 12/06/2014    Immunization History  Administered Date(s) Administered   Fluad Quad(high Dose 65+) 05/07/2021   Influenza Inj Mdck Quad Pf 05/27/2018   Influenza Split 04/22/2012   Influenza, High Dose Seasonal PF 05/12/2020   Influenza,inj,Quad PF,6+ Mos 05/04/2013, 04/28/2014, 04/20/2015, 05/05/2017, 05/24/2019   PFIZER(Purple Top)SARS-COV-2 Vaccination 09/25/2019, 10/16/2019, 07/13/2020   PNEUMOCOCCAL CONJUGATE-20 05/07/2021   Pneumococcal Polysaccharide-23 06/28/2010, 02/03/2020   Td 01/14/2019   Tdap 10/30/2007   Zoster, Live 03/12/2016    Conditions to be addressed/monitored:  Hypertension, Hyperlipidemia, Diabetes and GERD  There are no care plans that you recently modified to display for this patient.   Medication Assistance:  Trulicity obtained through [employer] medication assistance program.  Enrollment ends Dec 2023  Compliance/Adherence/Medication fill history: Care Gaps: Foot Exam  Covid Booster  Colonoscopy  Ophthalmology   Star-Rating Drugs: Losartan 100 mg last filled: 02/26/21 for 90-DS Metformin 1000 mg last filled: 02/26/21 for 90-DS Pravastatin 40 mg last filled: 02/26/21 for 90-DS  Patient's preferred pharmacy is:  [id number] [place] [place]Phone: [phone]: [phone] [facility] [address] [place] [place]Phone: [phone]: [phone]  Uses pill box? Yes Pt endorses 100% compliance  We discussed: Current pharmacy is preferred with insurance plan and patient is satisfied with pharmacy services Patient decided to: Continue current medication management strategy  Care Plan and Follow Up Patient  Decision:  Patient agrees to Care Plan and Follow-up.  Plan: Telephone follow up appointment with care management team member scheduled for:  08/14/2021 at 11:00 AM  AJunius Argyle PharmD, BPara March CPP  Clinical Pharmacist Practitioner  [phone] Current Barriers:  Suboptimal  therapeutic regimen for cholesterol  Pharmacist Clinical Goal(s):  Over the next 90 days, patient will achieve control of cholesterol as evidenced by LDL less than 70, triglycerides less than 150 maintain control of diabetes as evidenced by A1c less than 7.5%  through collaboration with PharmD and provider.   Interventions: 1:1 collaboration with Jerrol Banana., MD regarding development and update of comprehensive plan of care as evidenced by provider attestation and co-signature Inter-disciplinary care team collaboration (see longitudinal plan of care) Comprehensive medication review performed; medication list updated in electronic medical record  Hypertension (BP goal <130/80) -Controlled -Current treatment: Amlodipine 10 mg daily  Losartan 100 mg daily  -Medications previously tried: NA  -Current home readings: 150s-160s/70-80s   -Denies hypotensive/hypertensive symptoms -Counseled to monitor BP at home twice weekly, document, and provide log at future appointments. Counseled on proper monitoring technique. -Recommended to continue current medication  Hyperlipidemia: (LDL goal < 70) -Uncontrolled -Current treatment:  Pravastatin 40 mg nightly  Lovaza 1g twice daily  -Medications previously tried: NA   Diabetes (A1c goal <7.5%) -Diagnosed 1998 -Uncontrolled -Current medications: Glipizide 10 mg 1/2 tablet daily  Metformin 1000 mg twice daily  Trulicity 1.5 mg weekly  -Medications previously tried: NA  -Reports some GI upset with metformin, but tolerable. Did not want to switch to ER formulation today.  -Current home glucose readings fasting glucose: 180-210s   -Denies hypoglycemic/hyperglycemic symptoms -Current meal patterns: Continues to snack late at night on Cheese + Crackers OR PB&J sandwiches -Patient prefers to continue on same dose of Trulicity at this time.   GERD (Goal: Prevent heartburn or reflux ) -Controlled -Current treatment  Omeprazole 20 mg daily -Medications previously tried: NA  -Recommended to continue current medication  Patient Goals/Self-Care Activities Over the next 90 days, patient will:  - check glucose twice daily, before breakfast and at bedtime, document, and provide at future appointments check blood pressure twice weekly, document, and provide at future appointments  Follow Up Plan: ***

## 2021-08-14 NOTE — Telephone Encounter (Signed)
Copied from [place] [phone]. Topic: General - Other >> Aug 14, 2021 12:20 PM Fields, Museum/gallery conservator R wrote: Reason for CRM: pt states that he had a missed call from alex, requesting a call back at [phone]

## 2021-08-28 DIAGNOSIS — Z86010 Personal history of colonic polyps: Secondary | ICD-10-CM | POA: Diagnosis not present

## 2021-08-28 DIAGNOSIS — K219 Gastro-esophageal reflux disease without esophagitis: Secondary | ICD-10-CM | POA: Diagnosis not present

## 2021-09-05 ENCOUNTER — Other Ambulatory Visit: Payer: Self-pay

## 2021-09-05 ENCOUNTER — Emergency Department
Admission: EM | Admit: 2021-09-05 | Discharge: 2021-09-05 | Disposition: A | Discharge status: Home / Self Care | Payer: Medicare Other | Attending: Emergency Medicine | Admitting: Emergency Medicine

## 2021-09-05 ENCOUNTER — Emergency Department: Payer: Medicare Other

## 2021-09-05 ENCOUNTER — Encounter: Payer: Self-pay | Admitting: Emergency Medicine

## 2021-09-05 DIAGNOSIS — I6522 Occlusion and stenosis of left carotid artery: Secondary | ICD-10-CM | POA: Diagnosis not present

## 2021-09-05 DIAGNOSIS — R0902 Hypoxemia: Secondary | ICD-10-CM | POA: Diagnosis not present

## 2021-09-05 DIAGNOSIS — E119 Type 2 diabetes mellitus without complications: Secondary | ICD-10-CM | POA: Diagnosis not present

## 2021-09-05 DIAGNOSIS — I10 Essential (primary) hypertension: Secondary | ICD-10-CM | POA: Diagnosis not present

## 2021-09-05 DIAGNOSIS — R42 Dizziness and giddiness: Secondary | ICD-10-CM

## 2021-09-05 DIAGNOSIS — R739 Hyperglycemia, unspecified: Secondary | ICD-10-CM

## 2021-09-05 LAB — CBC
HCT: 45.2 % (ref 39.0–52.0)
Hemoglobin: 15.5 g/dL (ref 13.0–17.0)
MCH: 28.4 pg (ref 26.0–34.0)
MCHC: 34.3 g/dL (ref 30.0–36.0)
MCV: 82.9 fL (ref 80.0–100.0)
Platelets: 252 10*3/uL (ref 150–400)
RBC: 5.45 MIL/uL (ref 4.22–5.81)
RDW: 13.2 % (ref 11.5–15.5)
WBC: 10.1 10*3/uL (ref 4.0–10.5)
nRBC: 0 % (ref 0.0–0.2)

## 2021-09-05 LAB — CBG MONITORING, ED: Glucose-Capillary: 186 mg/dL — ABNORMAL HIGH (ref 70–99)

## 2021-09-05 LAB — BASIC METABOLIC PANEL
Anion gap: 10 (ref 5–15)
BUN: 19 mg/dL (ref 8–23)
CO2: 23 mmol/L (ref 22–32)
Calcium: 9.3 mg/dL (ref 8.9–10.3)
Chloride: 102 mmol/L (ref 98–111)
Creatinine, Ser: 1.02 mg/dL (ref 0.61–1.24)
GFR, Estimated: >60 mL/min (ref >60)
Glucose, Bld: 199 mg/dL — ABNORMAL HIGH (ref 70–99)
Potassium: 4.3 mmol/L (ref 3.5–5.1)
Sodium: 135 mmol/L (ref 135–145)

## 2021-09-05 LAB — URINALYSIS, ROUTINE W REFLEX MICROSCOPIC
Bilirubin Urine: NEGATIVE
Glucose, UA: 100 mg/dL — AB
Ketones, ur: 15 mg/dL — AB
Leukocytes,Ua: NEGATIVE
Nitrite: NEGATIVE
Protein, ur: 100 mg/dL — AB
Specific Gravity, Urine: 1.02 (ref 1.005–1.030)
pH: 5 (ref 5.0–8.0)

## 2021-09-05 LAB — URINALYSIS, MICROSCOPIC (REFLEX)
Bacteria, UA: NONE SEEN
Squamous Epithelial / HPF: NONE SEEN (ref 0–5)
WBC, UA: NONE SEEN WBC/hpf (ref 0–5)

## 2021-09-05 LAB — TROPONIN I (HIGH SENSITIVITY): Troponin I (High Sensitivity): 5 ng/L (ref <18)

## 2021-09-05 IMAGING — MR MR MRA NECK WO/W CM
3 of 4 series · 31 of 48 positions shown · IV contrast (gadavist)
Comparison: Same-day noncontrast CT head

CLINICAL DATA: Sudden onset dizziness

EXAM:
MRI HEAD WITHOUT CONTRAST
MRA HEAD WITHOUT CONTRAST
MRA OF THE NECK WITHOUT AND WITH CONTRAST
TECHNIQUE: Multiplanar, multi-echo pulse sequences of the brain and surrounding
structures were acquired without intravenous contrast. Angiographic
images of the Circle of Willis were acquired using MRA technique
without intravenous contrast. Angiographic images of the neck were
acquired using MRA technique without and with intravenous contrast.
Carotid stenosis measurements (when applicable) are obtained
utilizing NASCET criteria, using the distal internal carotid
diameter as the denominator.
CONTRAST:  9mL GADAVIST GADOBUTROL 1 MMOL/ML IV SOLN

[Series 9: angio_fl3d_cor_pre_ttc=2.0s · coronal · B · 0.9mm · 0.85mm/px · 11 of 96 slices shown]
[im 1/96]
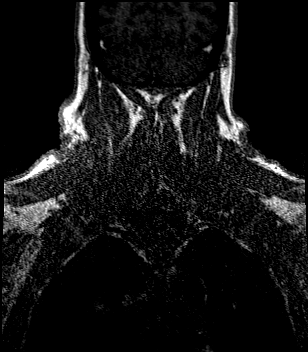
[im 10/96]
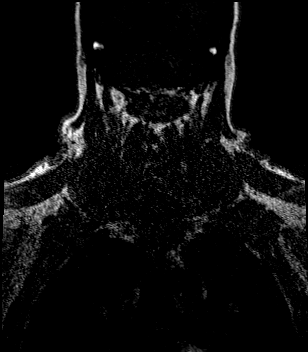
[im 20/96]
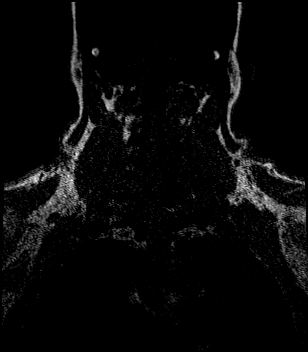
[im 29/96]
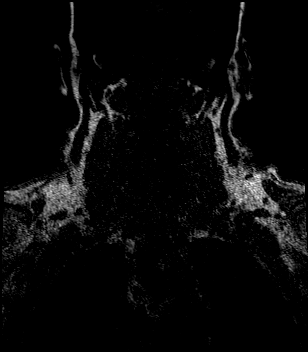
[im 39/96]
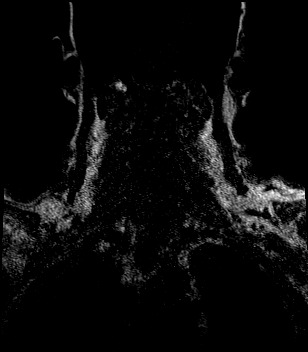
[im 48/96]
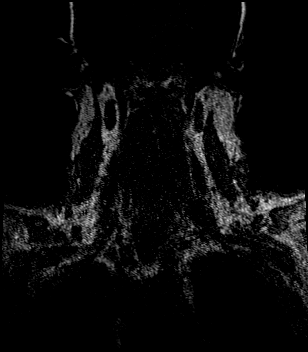
[im 58/96]
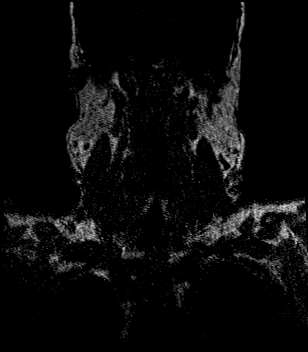
[im 67/96]
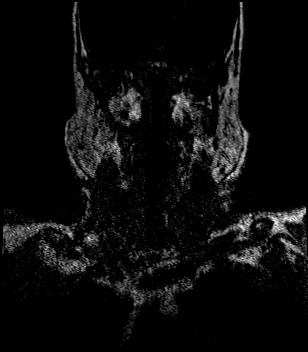
[im 77/96]
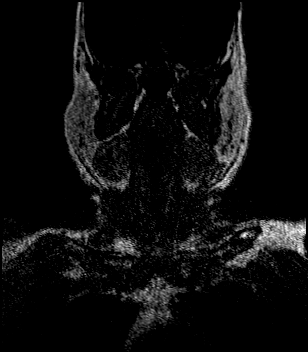
[im 86/96]
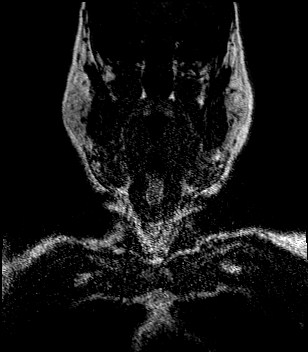
[im 96/96]
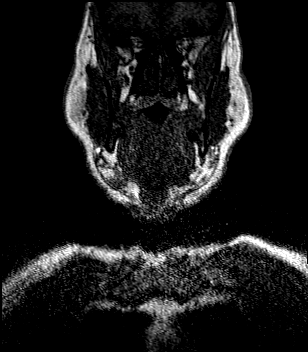

[Series 12: angio_fl3d_cor_post_ttc=2.0s_moco-adv · coronal · B · 0.9mm · 0.85mm/px · 10 of 92 slices shown]
[im 1/92]
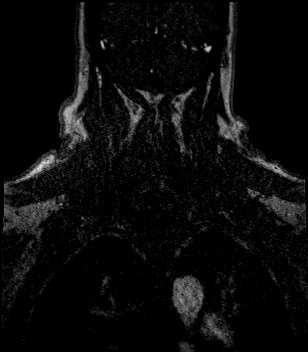
[im 11/92]
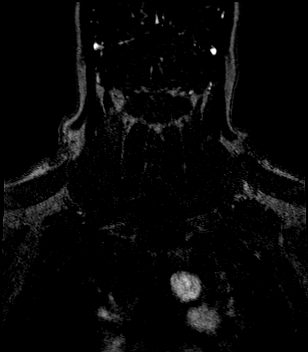
[im 21/92]
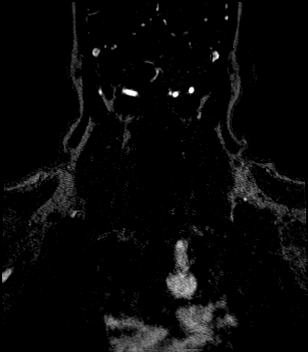
[im 31/92]
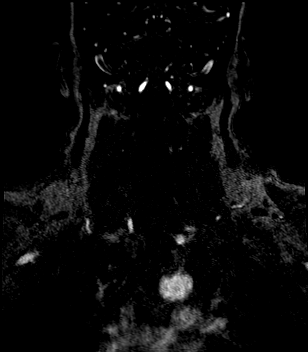
[im 41/92]
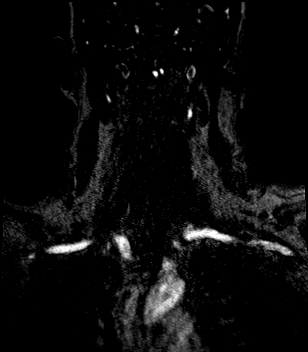
[im 51/92]
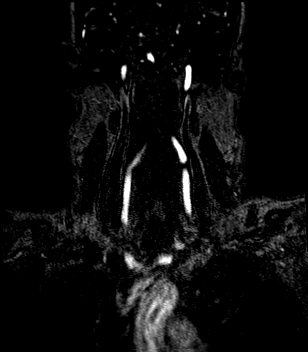
[im 61/92]
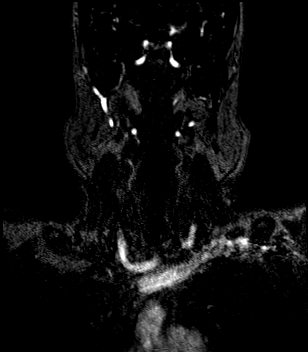
[im 71/92]
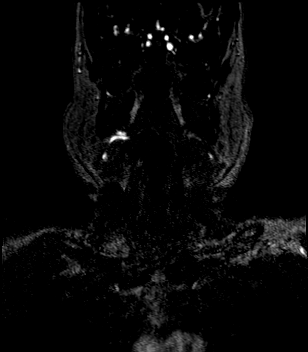
[im 81/92]
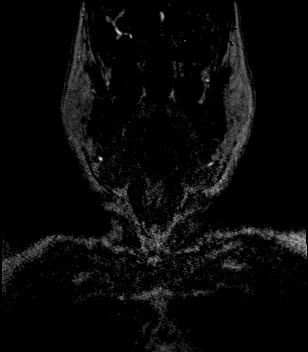
[im 92/92]
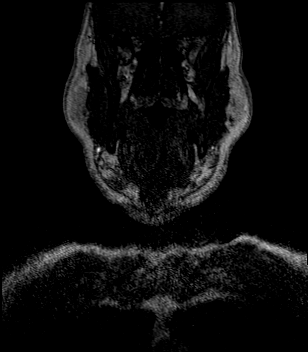

[Series 13: angio_fl3d_cor_post_ttc=2.0s_moco-adv_sub · coronal · B · 0.9mm · 0.85mm/px · 10 of 91 slices shown]
[im 1/91]
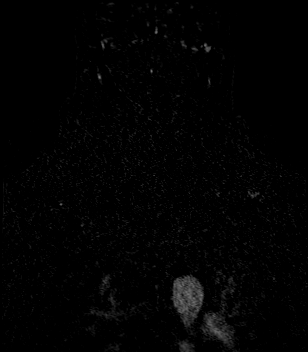
[im 11/91]
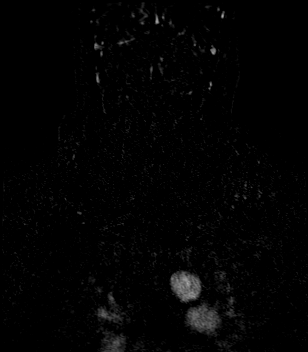
[im 21/91]
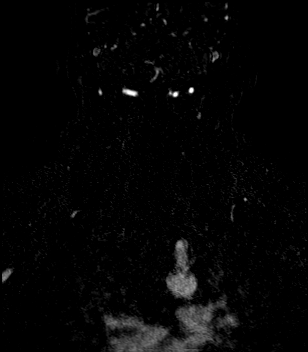
[im 31/91]
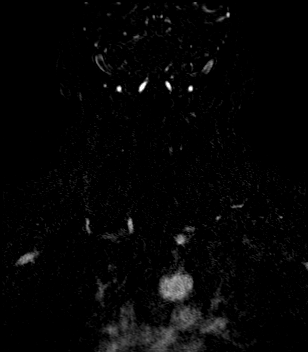
[im 41/91]
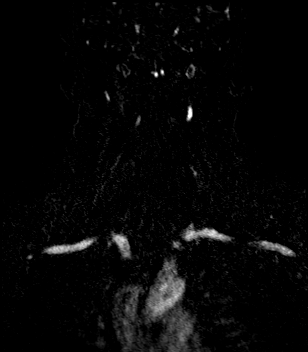
[im 51/91]
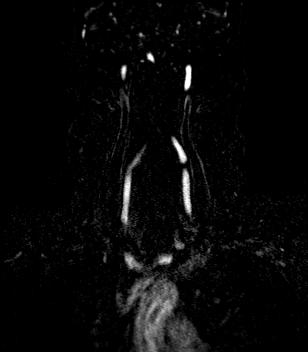
[im 61/91]
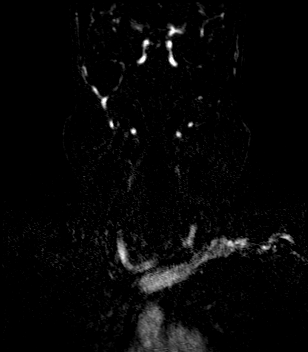
[im 71/91]
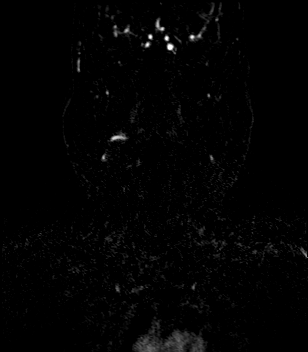
[im 81/91]
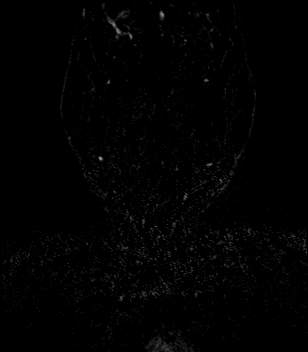
[im 91/91]
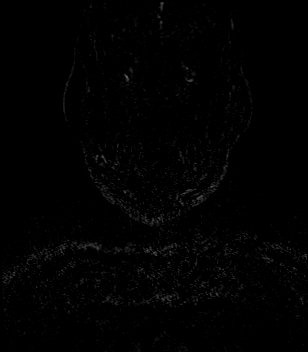

[31 of 48 positions shown; findings below may reference images not displayed]

FINDINGS: MRI HEAD FINDINGS

Brain: There is no evidence of acute intracranial hemorrhage,
extra-axial fluid collection, or acute infarct.

Background parenchymal volume is normal. The ventricles are normal
in size. There is minimal periventricular FLAIR signal abnormality
likely reflecting sequela of minimal chronic white matter
microangiopathy. The parenchyma is otherwise normal in appearance.

There is no mass lesion. There is no midline shift.

Vascular: The major flow voids are present. The vasculature is
assessed in full below.

Skull and upper cervical spine: Normal marrow signal.

Sinuses/Orbits: There is moderate mucosal thickening in the right
maxillary sinus. The globes and orbits are unremarkable.

Other: None.

MRA HEAD FINDINGS

The MRA images are mildly motion degraded.

Anterior circulation: The intracranial ICAs are patent with minimal
atherosclerotic irregularity. There is no hemodynamically
significant stenosis or occlusion.

The bilateral MCAs are patent

The bilateral ACAs are patent. The anterior communicating artery is
normal.

There is no aneurysm or AVM.

Posterior circulation: The bilateral V4 segments are patent. The
PICA is identified bilaterally. The basilar artery is patent.

The bilateral PCAs are patent.

There is no aneurysm or AVM.

Anatomic variants: None.

MRA NECK FINDINGS

Aortic arch: The aortic arch is normal in appearance. The origins of
the major branch vessels are patent. The subclavian arteries are
patent.

Right carotid system: The right common, internal, and external
carotid arteries are patent, without hemodynamically significant
stenosis, occlusion, dissection, or aneurysm.

Left carotid system: There is irregularity in the left carotid bulb
consistent with atherosclerotic disease resulting in up to
approximately 40% stenosis by NASCET criteria. Otherwise, the left
common, internal, and external carotid arteries are patent. There is
no evidence of dissection or aneurysm.

Vertebral arteries: The vertebral arteries are patent with antegrade
flow. There is no hemodynamically significant stenosis, occlusion,
dissection, or aneurysm.

Other: None.
IMPRESSION: 1. No acute intracranial pathology.
2. Atherosclerotic plaque at the left carotid bulb resulting in
approximately 40% stenosis. Otherwise, patent vasculature of the
head and neck.

## 2021-09-05 IMAGING — CT CT HEAD W/O CM
4 series · 17 of 47 positions shown, 19 images · non-contrast
Comparison: None.

CLINICAL DATA: Dizziness.  Neuro deficit, acute, stroke suspected



[Series 2: head wo · axial · 0.41mm/px · z∈[-122,-7]mm · 7 of 31 slices shown, 9 images]
[im 4/31  brain]
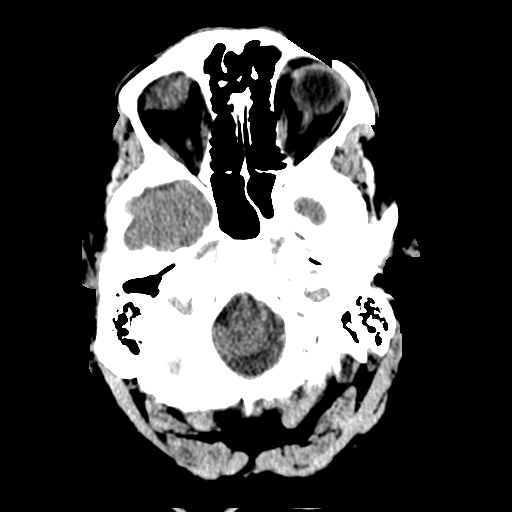
[im 4/31  bone]
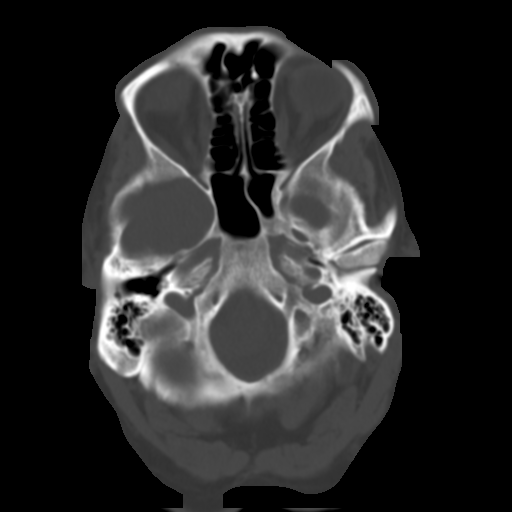
[im 8/31  brain]
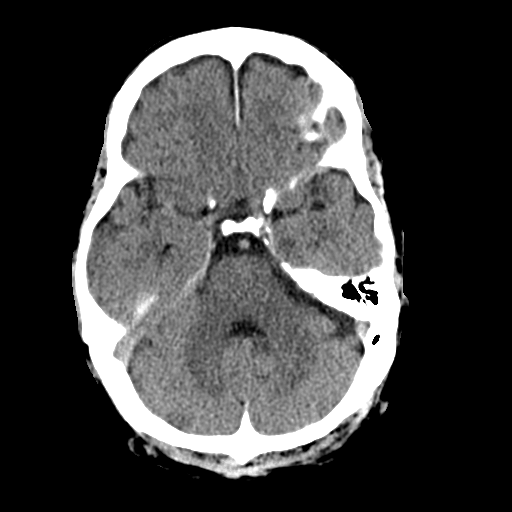
[im 12/31  brain]
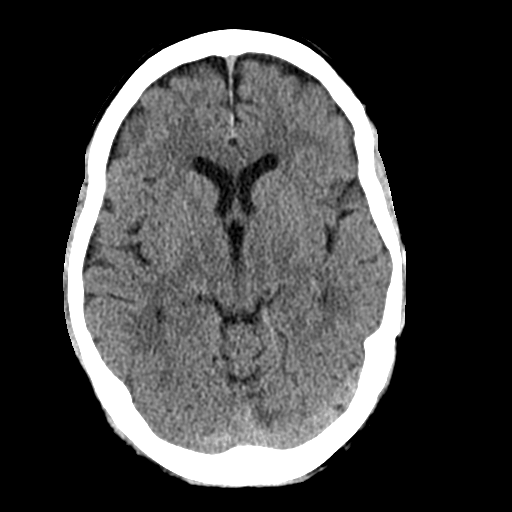
[im 16/31  brain]
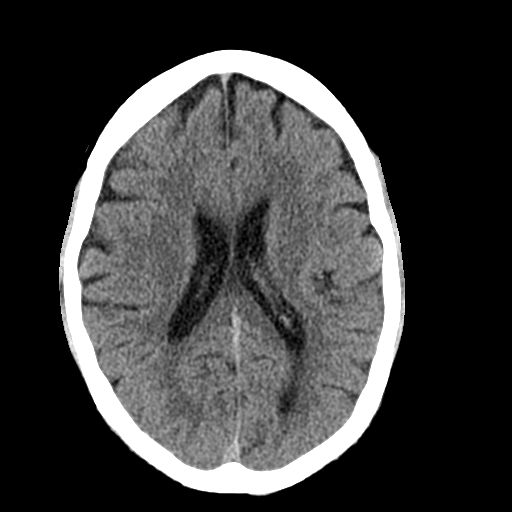
[im 19/31  brain]
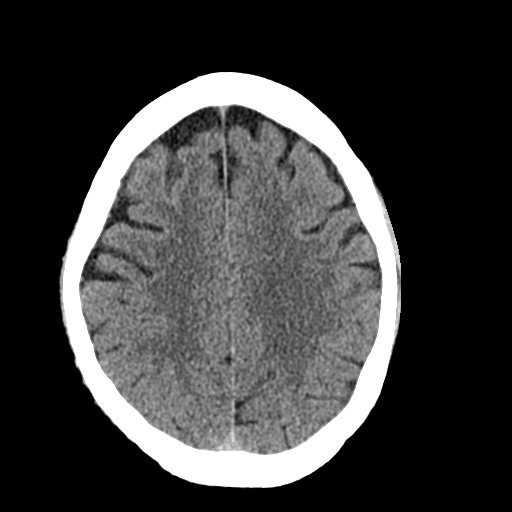
[im 19/31  bone]
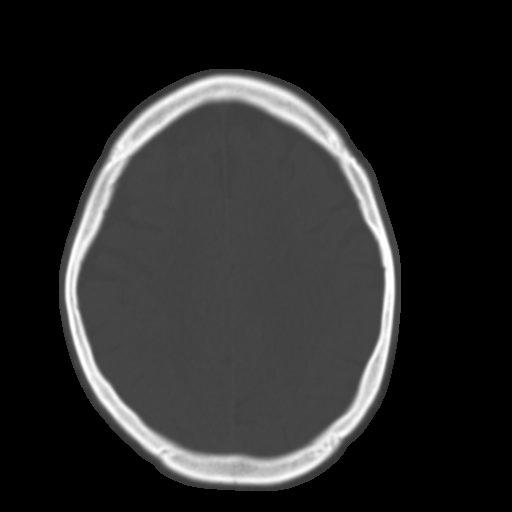
[im 23/31  brain]
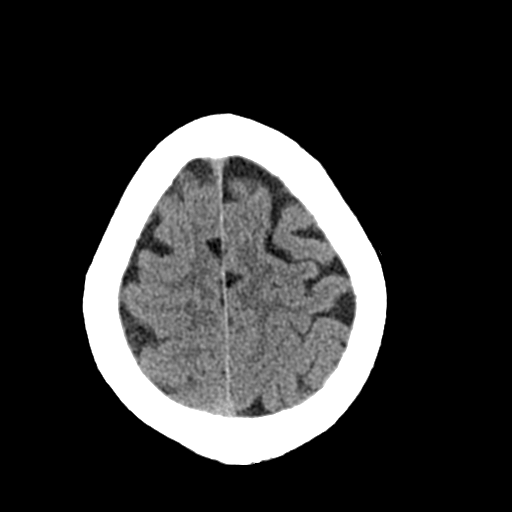
[im 27/31  brain]
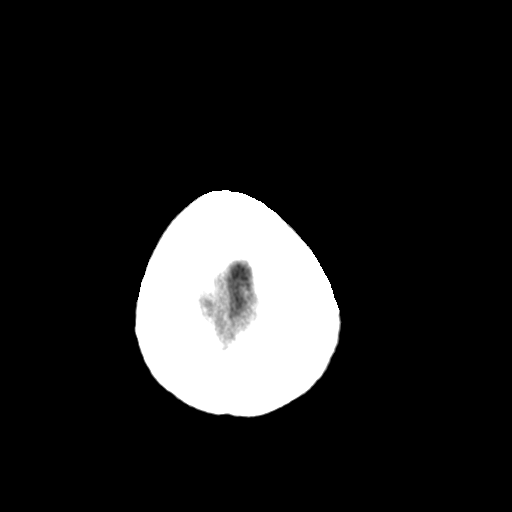

[Series 3: head bone · axial · 0.41mm/px · z∈[-123,-69]mm · 4 of 78 slices shown]
[im 8/78  bone]
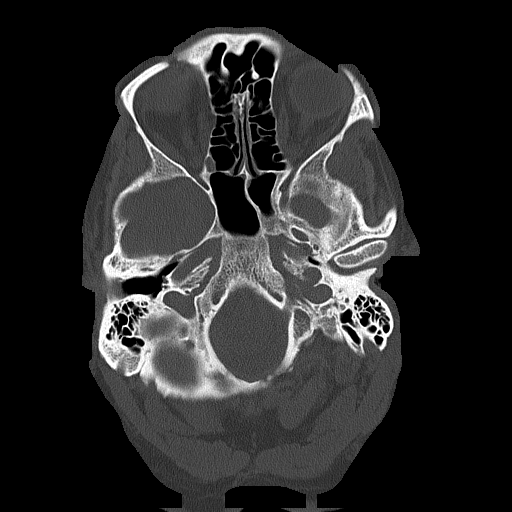
[im 16/78  bone]
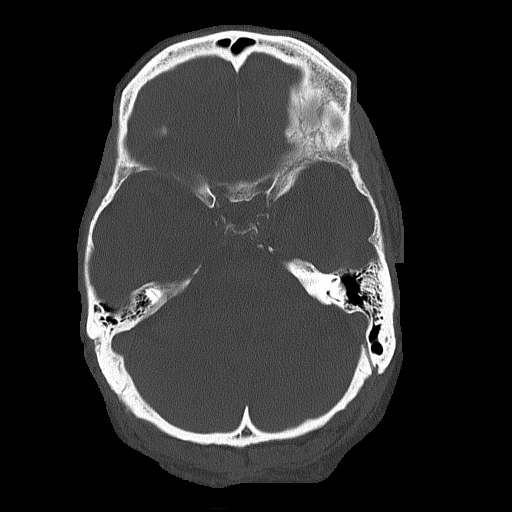
[im 24/78  bone]
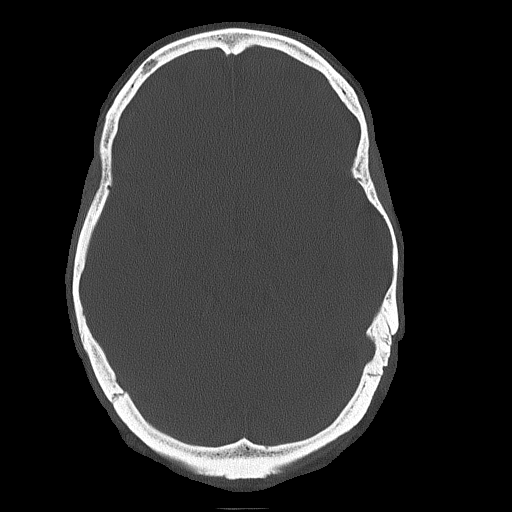
[im 35/78  bone]
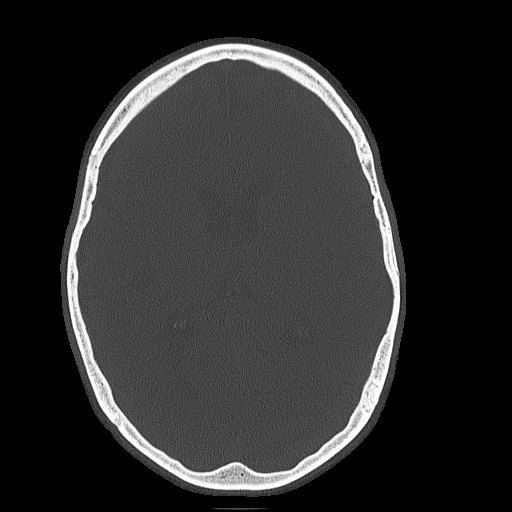

[Series 4: coronal soft tissue · coronal · 0.32mm/px · 3 of 68 slices shown]
[im 23/68  brain]
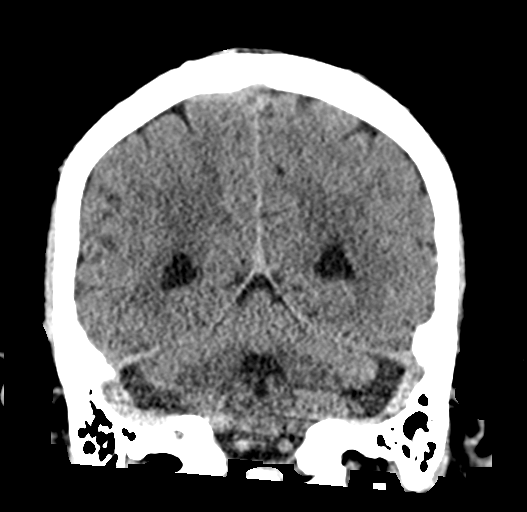
[im 30/68  brain]
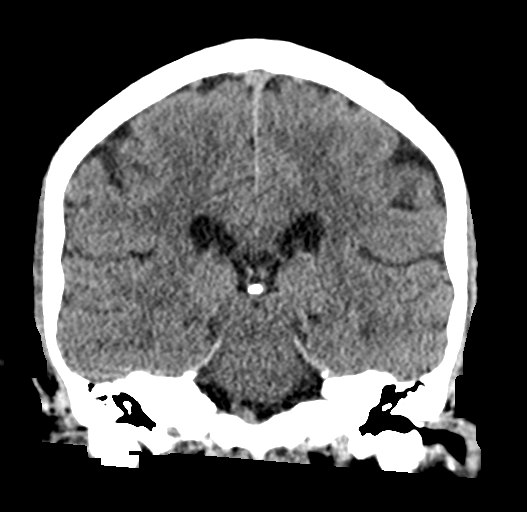
[im 38/68  brain]
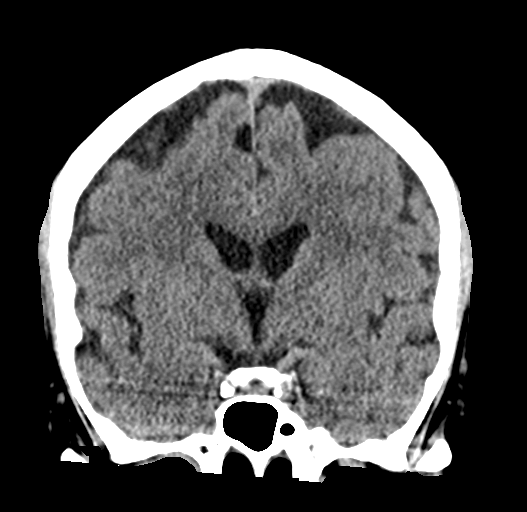

[Series 5: sagittal soft tissue · sagittal · 0.32mm/px · 3 of 57 slices shown]
[im 19/57  brain]
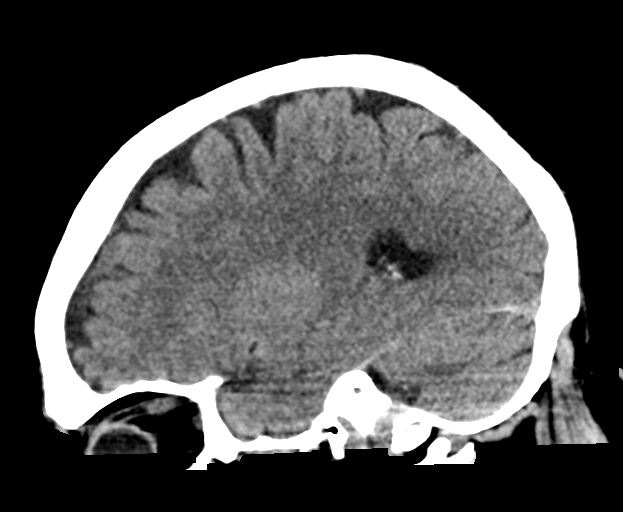
[im 29/57  brain]
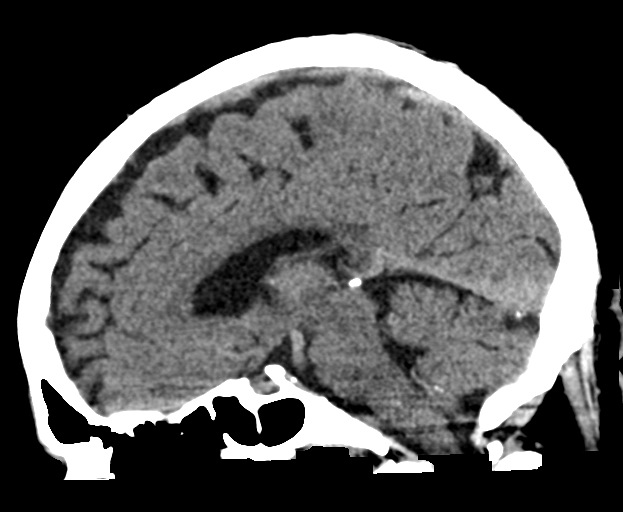
[im 38/57  brain]
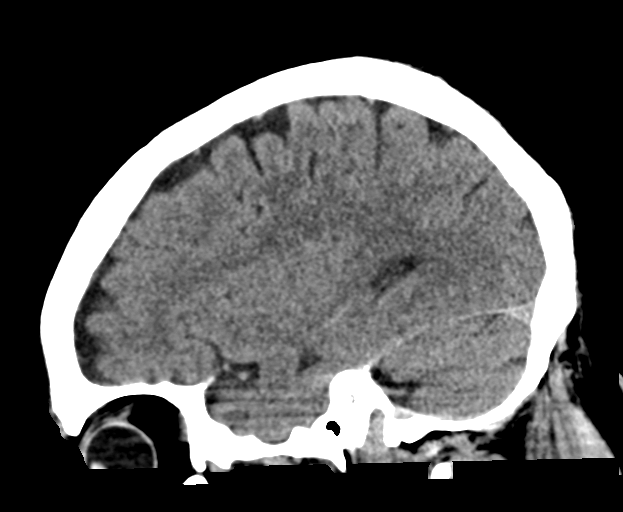

[17 of 47 positions shown; findings below may reference images not displayed]

FINDINGS: Brain: No evidence of acute infarction, hemorrhage, hydrocephalus,
extra-axial collection or mass lesion/mass effect. Mild age related
cerebral volume loss.

Vascular: Atherosclerotic calcifications involving the large vessels
of the skull base. No unexpected hyperdense vessel.

Skull: Normal. Negative for fracture or focal lesion.

Sinuses/Orbits: Mucosal thickening with air-fluid level in the right
maxillary sinus. Remaining visualized paranasal sinuses and mastoid
air cells are clear.

Other: None.
IMPRESSION: 1. No acute intracranial abnormality.
2. Right maxillary sinus disease.

## 2021-09-05 IMAGING — MR MR HEAD W/O CM
11 series · 48 of 48 positions shown · IV contrast (gadavist)
Comparison: Same-day noncontrast CT head

CLINICAL DATA: Sudden onset dizziness

EXAM:
MRI HEAD WITHOUT CONTRAST
MRA HEAD WITHOUT CONTRAST
MRA OF THE NECK WITHOUT AND WITH CONTRAST
TECHNIQUE: Multiplanar, multi-echo pulse sequences of the brain and surrounding
structures were acquired without intravenous contrast. Angiographic
images of the Circle of Willis were acquired using MRA technique
without intravenous contrast. Angiographic images of the neck were
acquired using MRA technique without and with intravenous contrast.
Carotid stenosis measurements (when applicable) are obtained
utilizing NASCET criteria, using the distal internal carotid
diameter as the denominator.
CONTRAST:  9mL GADAVIST GADOBUTROL 1 MMOL/ML IV SOLN

[Series 5: ax dwi_tracew · axial · B · 3.0mm · 0.71mm/px · z∈[-92,+73]mm · 5 of 56 slices shown]
[im 1/56]
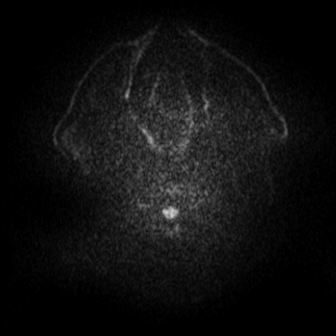
[im 14/56]
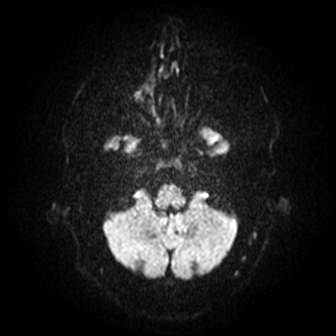
[im 28/56]
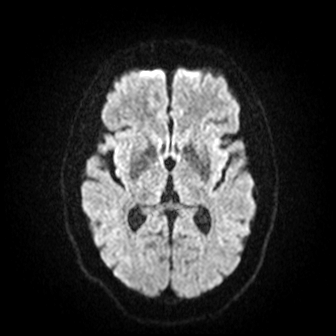
[im 42/56]
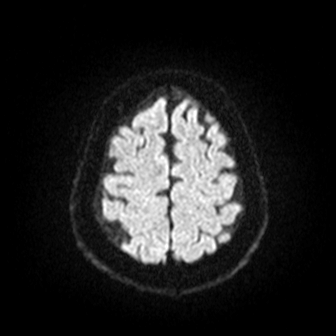
[im 56/56]
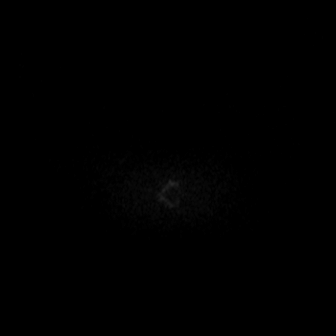

[Series 6: ax dwi_adc · axial · B · 3.0mm · 0.71mm/px · z∈[-92,+73]mm · 5 of 56 slices shown]
[im 1/56]
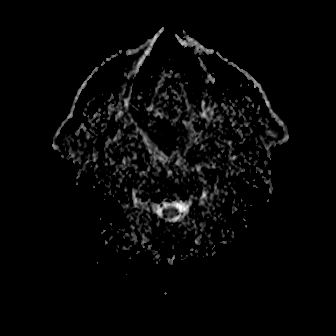
[im 14/56]
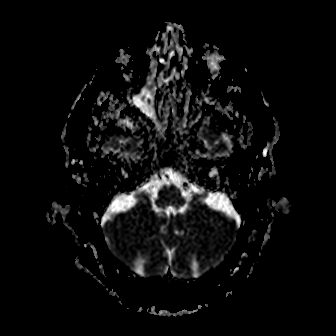
[im 28/56]
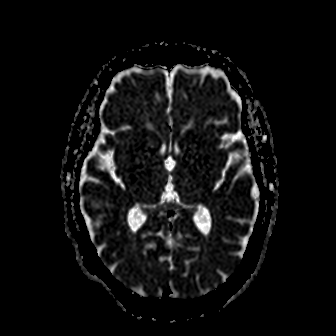
[im 42/56]
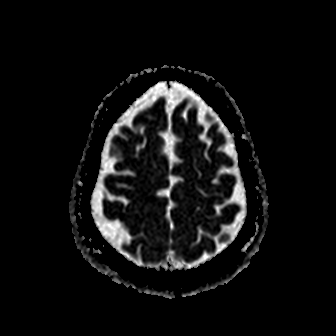
[im 56/56]
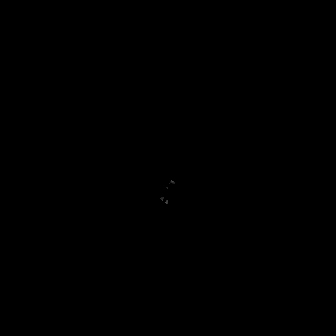

[Series 7: cor dwi_tracew · coronal · B · 5.0mm · 0.68mm/px · 3 of 40 slices shown]
[im 1/40]
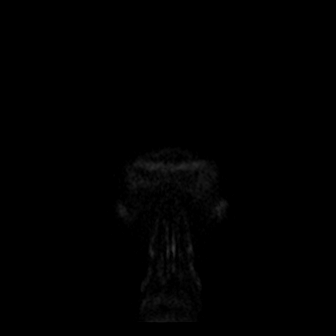
[im 20/40]
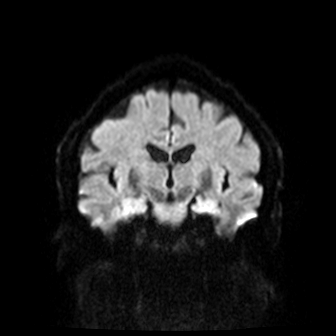
[im 40/40]
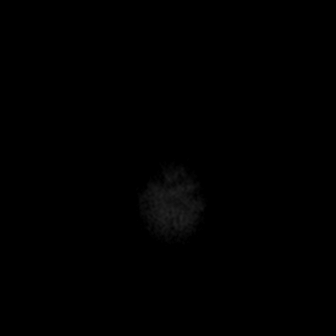

[Series 8: cor dwi_adc · coronal · B · 5.0mm · 0.68mm/px · 3 of 40 slices shown]
[im 1/40]
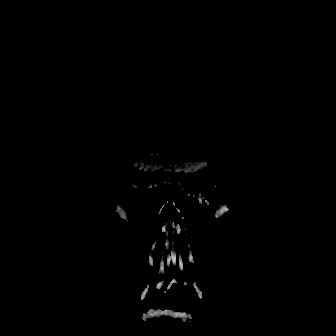
[im 20/40]
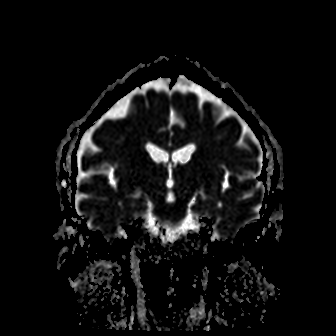
[im 40/40]
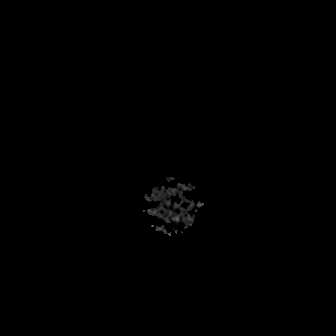

[Series 9: T1 · sagittal · B · 5.0mm · 0.47mm/px · 2 of 24 slices shown (1 of 2)]
[im 1/24]
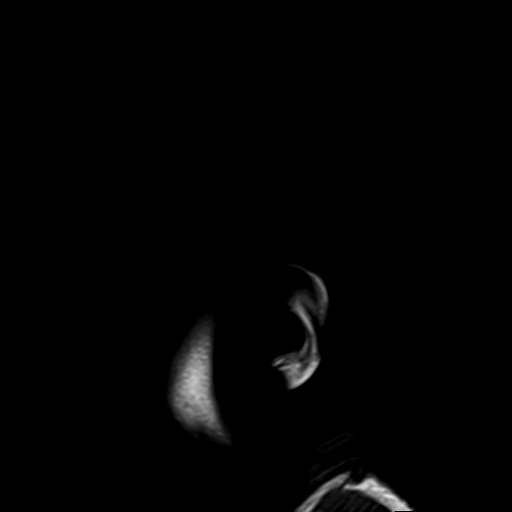
[im 24/24]
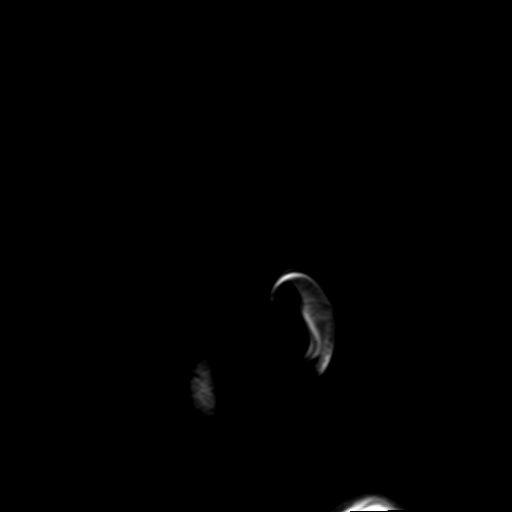

[Series 10: T2 · axial · B · 5.0mm · 0.86mm/px · z∈[-83,+73]mm · 2 of 27 slices shown (1 of 2)]
[im 1/27]
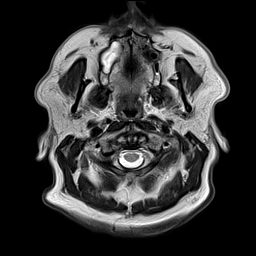
[im 27/27]
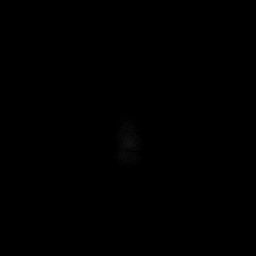

[Series 12: pha_images · axial · B · 3.0mm · 0.90mm/px · z∈[-82,+68]mm · 4 of 51 slices shown]
[im 1/51]
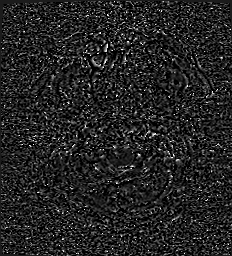
[im 17/51]
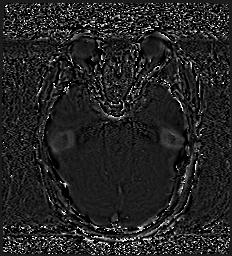
[im 34/51]
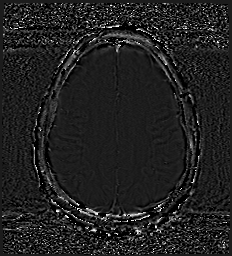
[im 51/51]
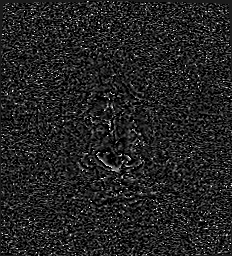

[Series 13: swi_images · axial · B · 3.0mm · 0.90mm/px · z∈[-82,+71]mm · 4 of 52 slices shown]
[im 1/52]
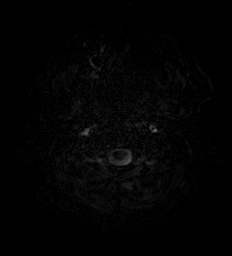
[im 18/52]
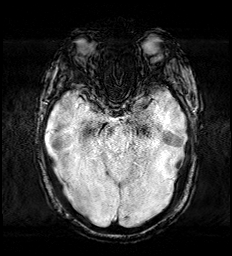
[im 35/52]
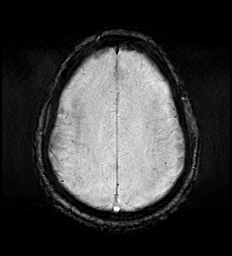
[im 52/52]
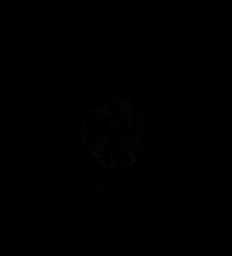

[Series 15: FLAIR · axial · B · 3.0mm · 0.69mm/px · z∈[-86,+73]mm · 4 of 54 slices shown]
[im 1/54]
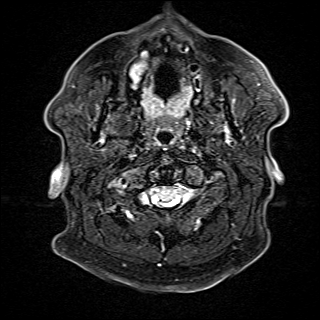
[im 18/54]
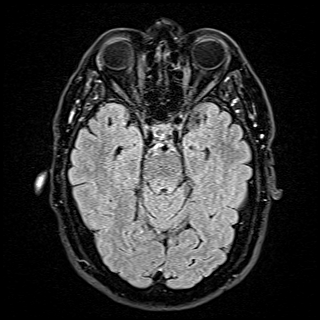
[im 36/54]
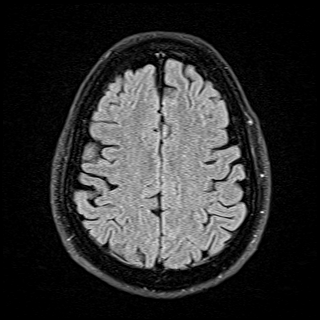
[im 54/54]
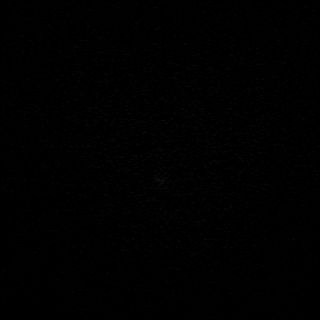

[Series 16: T1 · axial · B · 1.0mm · 0.98mm/px · z∈[-92,+83]mm · 14 of 174 slices shown (2 of 2)]
[im 1/174]
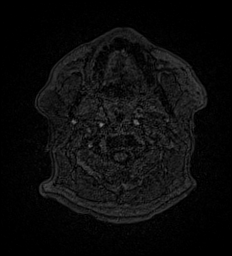
[im 14/174]
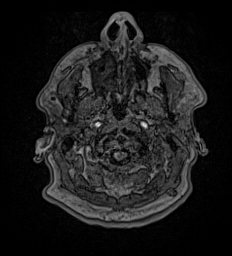
[im 27/174]
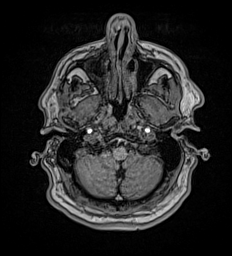
[im 40/174]
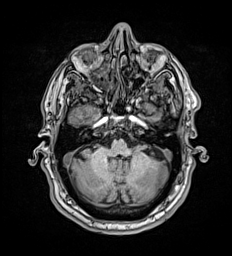
[im 54/174]
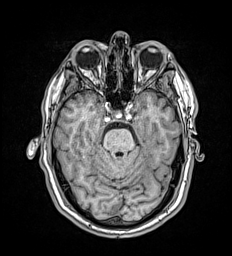
[im 67/174]
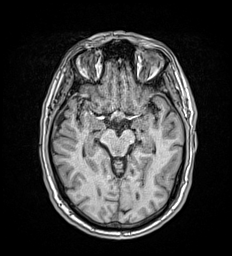
[im 80/174]
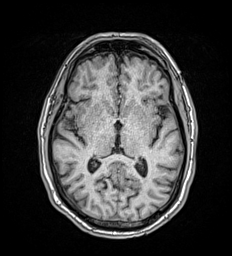
[im 94/174]
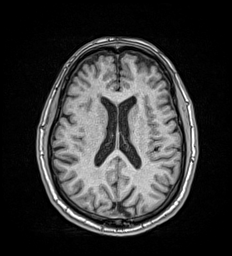
[im 107/174]
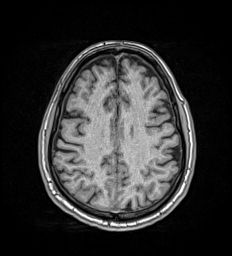
[im 120/174]
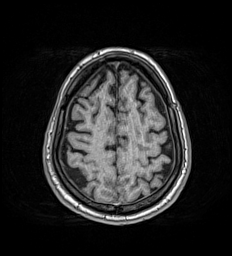
[im 134/174]
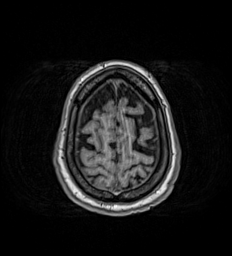
[im 147/174]
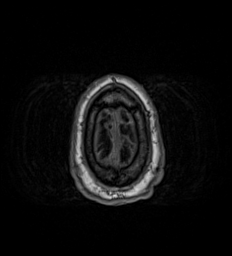
[im 160/174]
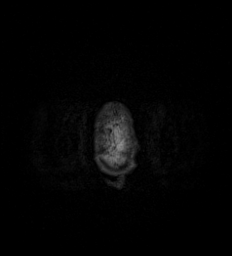
[im 174/174]
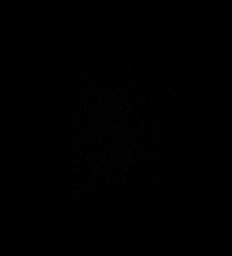

[Series 17: T2 · coronal · B · 5.0mm · 0.86mm/px · 2 of 30 slices shown (2 of 2)]
[im 1/30]
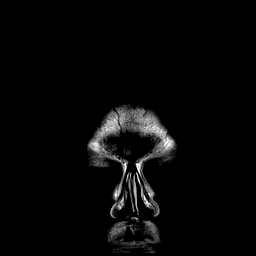
[im 30/30]
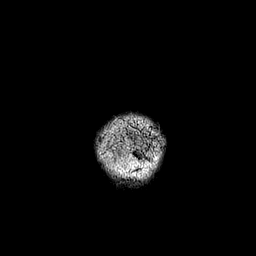

[48 of 48 positions shown; findings below may reference images not displayed]

FINDINGS: MRI HEAD FINDINGS

Brain: There is no evidence of acute intracranial hemorrhage,
extra-axial fluid collection, or acute infarct.

Background parenchymal volume is normal. The ventricles are normal
in size. There is minimal periventricular FLAIR signal abnormality
likely reflecting sequela of minimal chronic white matter
microangiopathy. The parenchyma is otherwise normal in appearance.

There is no mass lesion. There is no midline shift.

Vascular: The major flow voids are present. The vasculature is
assessed in full below.

Skull and upper cervical spine: Normal marrow signal.

Sinuses/Orbits: There is moderate mucosal thickening in the right
maxillary sinus. The globes and orbits are unremarkable.

Other: None.

MRA HEAD FINDINGS

The MRA images are mildly motion degraded.

Anterior circulation: The intracranial ICAs are patent with minimal
atherosclerotic irregularity. There is no hemodynamically
significant stenosis or occlusion.

The bilateral MCAs are patent

The bilateral ACAs are patent. The anterior communicating artery is
normal.

There is no aneurysm or AVM.

Posterior circulation: The bilateral V4 segments are patent. The
PICA is identified bilaterally. The basilar artery is patent.

The bilateral PCAs are patent.

There is no aneurysm or AVM.

Anatomic variants: None.

MRA NECK FINDINGS

Aortic arch: The aortic arch is normal in appearance. The origins of
the major branch vessels are patent. The subclavian arteries are
patent.

Right carotid system: The right common, internal, and external
carotid arteries are patent, without hemodynamically significant
stenosis, occlusion, dissection, or aneurysm.

Left carotid system: There is irregularity in the left carotid bulb
consistent with atherosclerotic disease resulting in up to
approximately 40% stenosis by NASCET criteria. Otherwise, the left
common, internal, and external carotid arteries are patent. There is
no evidence of dissection or aneurysm.

Vertebral arteries: The vertebral arteries are patent with antegrade
flow. There is no hemodynamically significant stenosis, occlusion,
dissection, or aneurysm.

Other: None.
IMPRESSION: 1. No acute intracranial pathology.
2. Atherosclerotic plaque at the left carotid bulb resulting in
approximately 40% stenosis. Otherwise, patent vasculature of the
head and neck.

## 2021-09-05 IMAGING — MR MR MRA HEAD W/O CM
1 series · 18 of 48 positions shown · IV contrast (gadavist)
Comparison: Same-day noncontrast CT head

CLINICAL DATA: Sudden onset dizziness

EXAM:
MRI HEAD WITHOUT CONTRAST
MRA HEAD WITHOUT CONTRAST
MRA OF THE NECK WITHOUT AND WITH CONTRAST
TECHNIQUE: Multiplanar, multi-echo pulse sequences of the brain and surrounding
structures were acquired without intravenous contrast. Angiographic
images of the Circle of Willis were acquired using MRA technique
without intravenous contrast. Angiographic images of the neck were
acquired using MRA technique without and with intravenous contrast.
Carotid stenosis measurements (when applicable) are obtained
utilizing NASCET criteria, using the distal internal carotid
diameter as the denominator.
CONTRAST:  9mL GADAVIST GADOBUTROL 1 MMOL/ML IV SOLN

[Series 1: TOF · axial · B · 0.5mm · 0.41mm/px · z∈[-87,+10]mm · 18 of 205 slices shown]
[im 1/205]
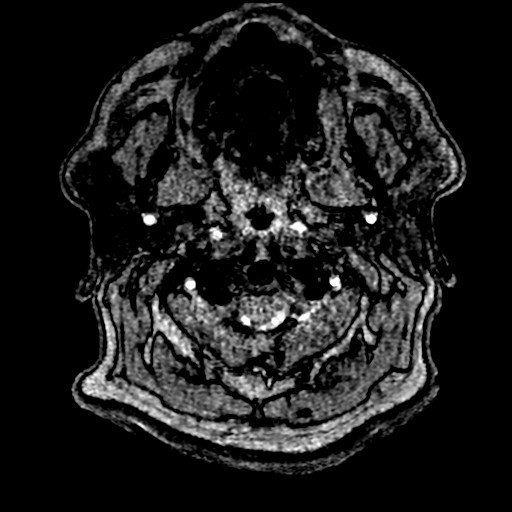
[im 5/205]
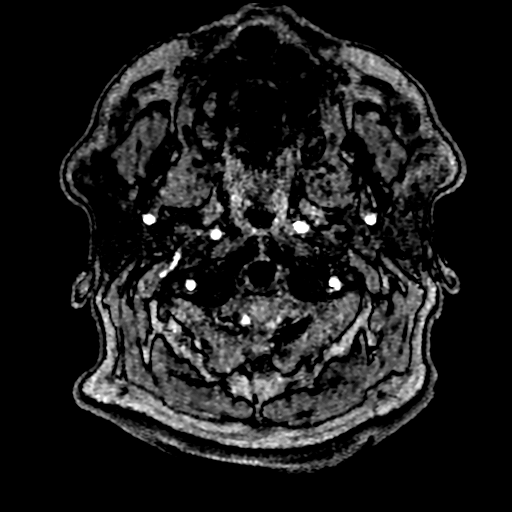
[im 9/205]
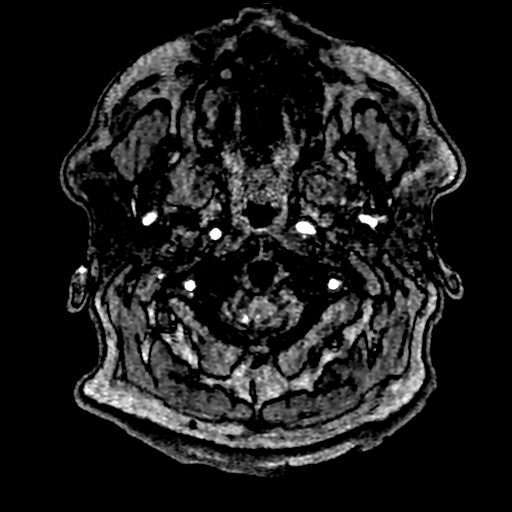
[im 14/205]
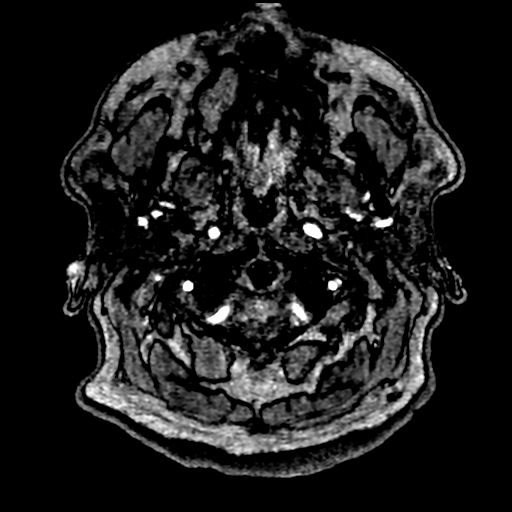
[im 18/205]
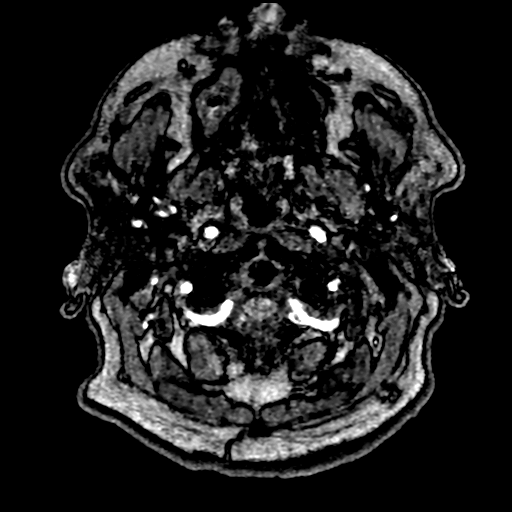
[im 22/205]
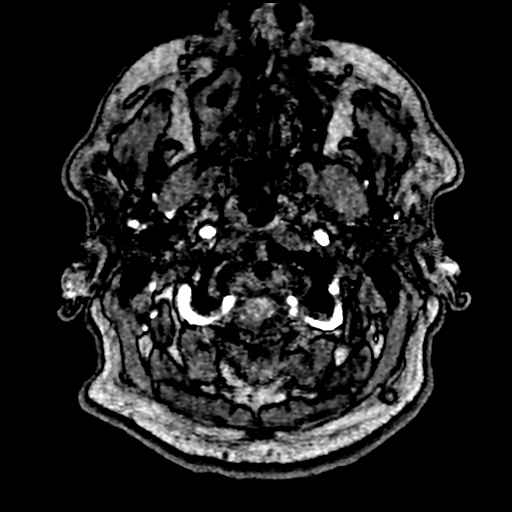
[im 27/205]
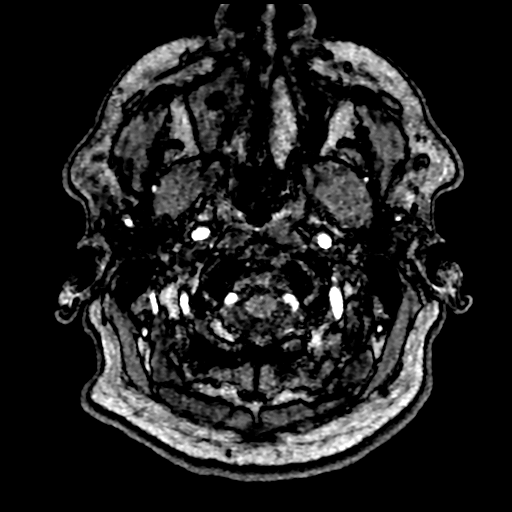
[im 31/205]
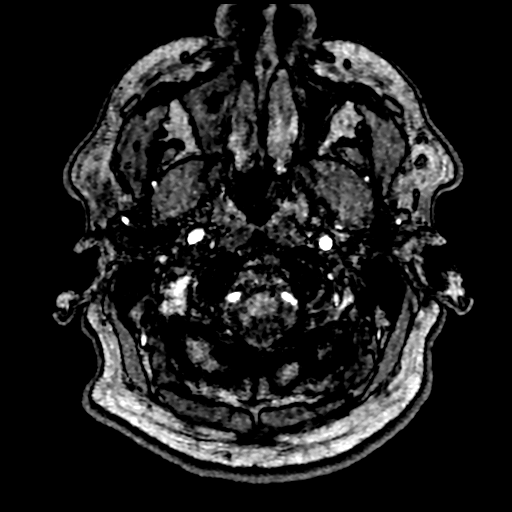
[im 35/205]
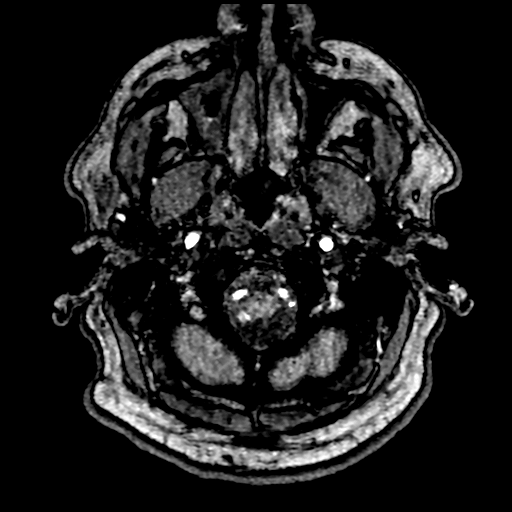
[im 40/205]
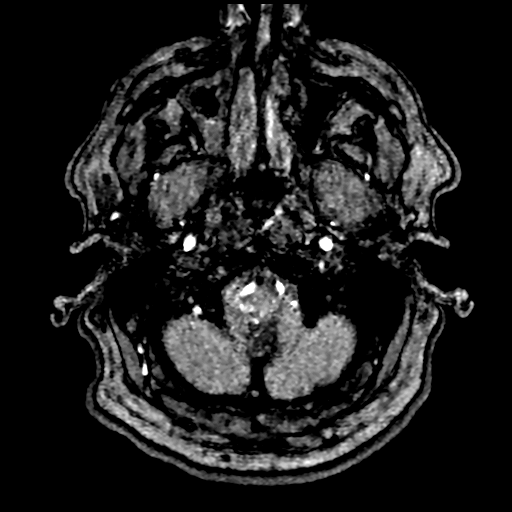
[im 66/205]
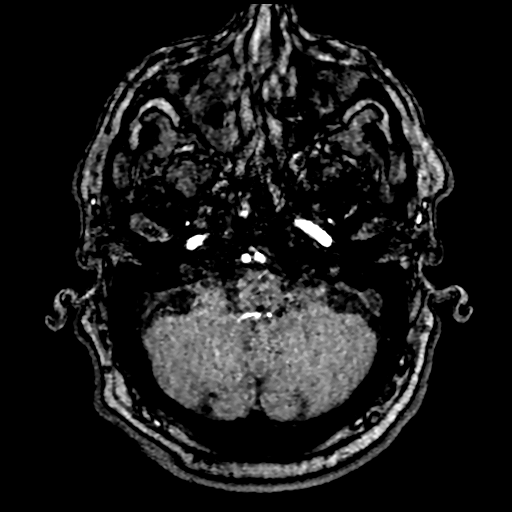
[im 92/205]
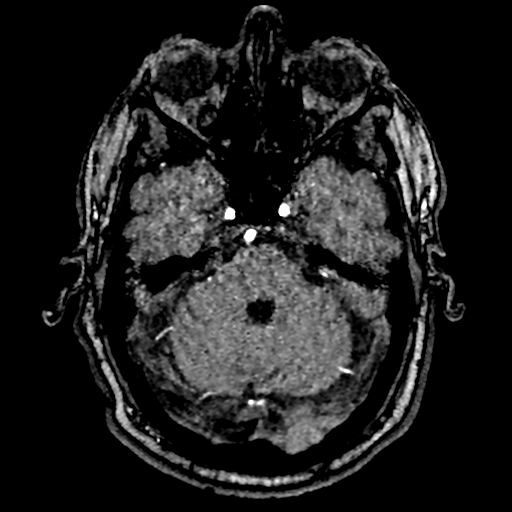
[im 105/205]
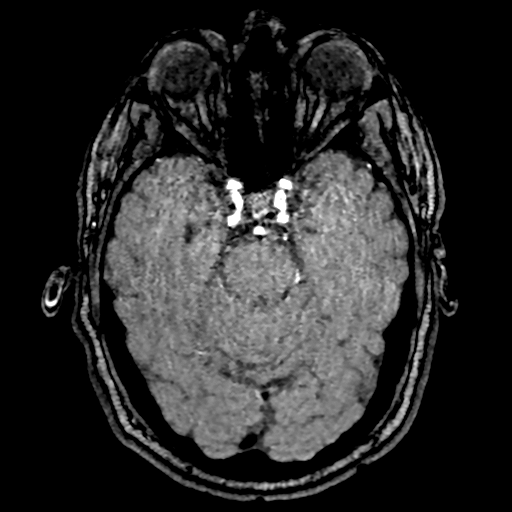
[im 118/205]
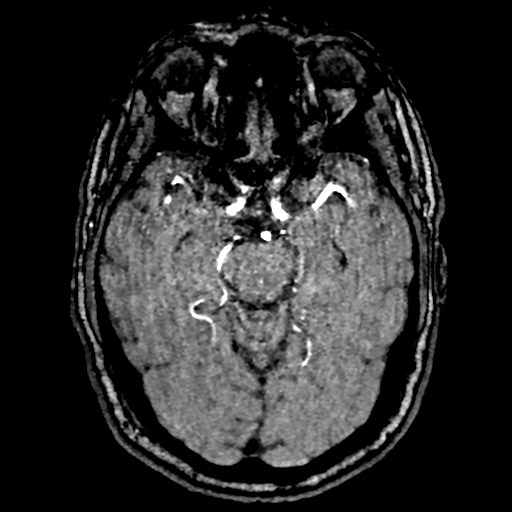
[im 144/205]
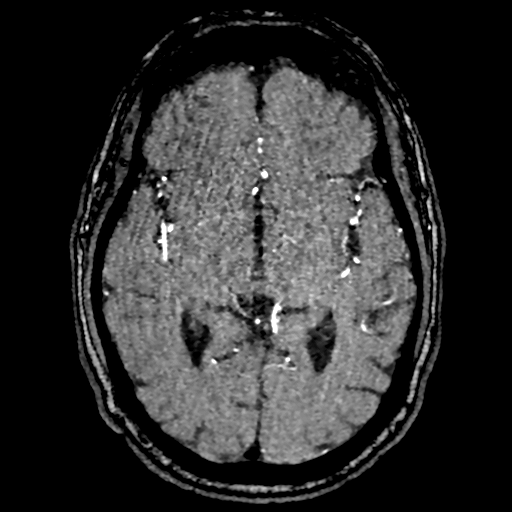
[im 170/205]
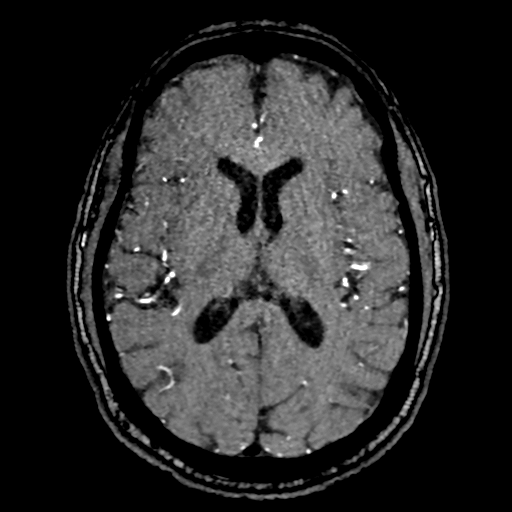
[im 174/205]
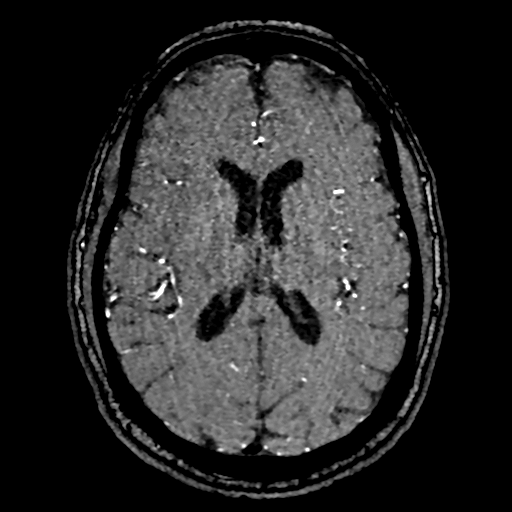
[im 196/205]
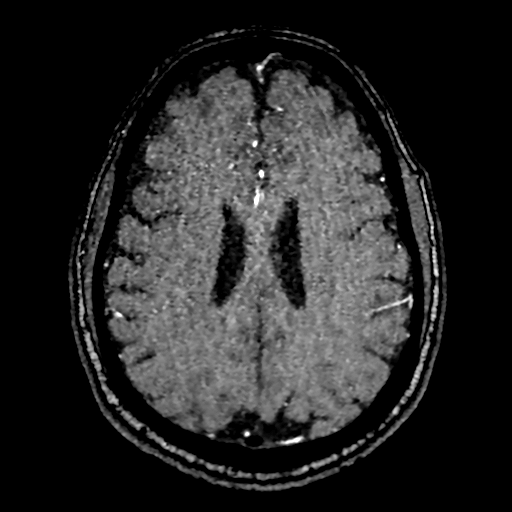

[18 of 48 positions shown; findings below may reference images not displayed]

FINDINGS: MRI HEAD FINDINGS

Brain: There is no evidence of acute intracranial hemorrhage,
extra-axial fluid collection, or acute infarct.

Background parenchymal volume is normal. The ventricles are normal
in size. There is minimal periventricular FLAIR signal abnormality
likely reflecting sequela of minimal chronic white matter
microangiopathy. The parenchyma is otherwise normal in appearance.

There is no mass lesion. There is no midline shift.

Vascular: The major flow voids are present. The vasculature is
assessed in full below.

Skull and upper cervical spine: Normal marrow signal.

Sinuses/Orbits: There is moderate mucosal thickening in the right
maxillary sinus. The globes and orbits are unremarkable.

Other: None.

MRA HEAD FINDINGS

The MRA images are mildly motion degraded.

Anterior circulation: The intracranial ICAs are patent with minimal
atherosclerotic irregularity. There is no hemodynamically
significant stenosis or occlusion.

The bilateral MCAs are patent

The bilateral ACAs are patent. The anterior communicating artery is
normal.

There is no aneurysm or AVM.

Posterior circulation: The bilateral V4 segments are patent. The
PICA is identified bilaterally. The basilar artery is patent.

The bilateral PCAs are patent.

There is no aneurysm or AVM.

Anatomic variants: None.

MRA NECK FINDINGS

Aortic arch: The aortic arch is normal in appearance. The origins of
the major branch vessels are patent. The subclavian arteries are
patent.

Right carotid system: The right common, internal, and external
carotid arteries are patent, without hemodynamically significant
stenosis, occlusion, dissection, or aneurysm.

Left carotid system: There is irregularity in the left carotid bulb
consistent with atherosclerotic disease resulting in up to
approximately 40% stenosis by NASCET criteria. Otherwise, the left
common, internal, and external carotid arteries are patent. There is
no evidence of dissection or aneurysm.

Vertebral arteries: The vertebral arteries are patent with antegrade
flow. There is no hemodynamically significant stenosis, occlusion,
dissection, or aneurysm.

Other: None.
IMPRESSION: 1. No acute intracranial pathology.
2. Atherosclerotic plaque at the left carotid bulb resulting in
approximately 40% stenosis. Otherwise, patent vasculature of the
head and neck.

## 2021-09-05 MED ORDER — DIAZEPAM 5 MG PO TABS
5.0000 mg | ORAL_TABLET | Freq: Once | ORAL | Status: AC
  Administered 2021-09-05: 16:00:00 5 mg via ORAL
  Filled 2021-09-05: qty 1

## 2021-09-05 MED ORDER — GADOBUTROL 1 MMOL/ML IV SOLN
9.0000 mL | Freq: Once | INTRAVENOUS | Status: AC | PRN
  Administered 2021-09-05: 17:00:00 9 mL via INTRAVENOUS
  Filled 2021-09-05: qty 10

## 2021-09-05 MED ORDER — MECLIZINE HCL 25 MG PO TABS: 25.0000 mg | ORAL_TABLET | Freq: Three times a day (TID) | ORAL | 0 refills | Status: AC | PRN

## 2021-09-05 NOTE — ED Triage Notes (Signed)
Pt comes into the ED via [facility] froim work with sudden onset dizziness.  Pt states the dizziness started at 10:30 this morning, with LKW at 9:00.  Pt is diabetic with CBG 190.  NIH negative with EMS.  Pt describes the dizziness as "I feel drunk".   142/70 70HR 94% RA EKG shows right bundle with no h/o

## 2021-09-05 NOTE — ED Provider Notes (Signed)
[facility] Provider Note    Event Date/Time   First MD Initiated Contact with Patient 09/05/21 1456     (approximate)   History   Dizziness   HPI  Jeffrey [name] is a 66 y.o. male with past medical history of DM, HTN and HDL who presents accompanied by his spouse for assessment of dizziness and sensation of "being drunk" that started around 10 AM this morning.  No prior similar episodes or recent alcohol.  Patient states the sensation is present all the time but is slightly worse with movement and he feels he cannot walk properly.  He has been feeling nauseous and vomited once.  No vision changes, headache, neck pain, fevers, earache, sore throat, chest pain, cough, shortness of breath, diarrhea, constipation, urinary symptoms or focal extremity weakness numbness tingling or rashes.  No recent injuries or falls.  Patient denies EtOH use illicit drug use or tobacco abuse.  No history of CVA or TIA.  Patient states she had a similar episode of dizziness about a year ago that resolved on its own and is not sure what that was caused by.  He has never been formally diagnosed by BPV or any other peripheral vertigo that he is aware of.      Physical Exam  Triage Vital Signs: ED Triage Vitals [09/05/21 1403]  Enc Vitals Group     BP      Pulse      Resp      Temp      Temp src      SpO2      Weight 203 lb 14.8 oz (92.5 kg)     Height 5\' 7"  (1.702 m)     Head Circumference      Peak Flow      Pain Score 0     Pain Loc      Pain Edu?      Excl. in [place]?     Most recent vital signs: Vitals:   09/05/21 1539  BP: 138/72  Pulse: 85  Resp: 16  Temp: 98.3 F (36.8 C)  SpO2: 97%    General: Awake, no distress.  CV:  Good peripheral perfusion.  No murmurs rubs or gallops.  2+ radial pulses. Resp:  Normal effort.  Abd:  No distention.  Other:  Cranial nerves II through XII grossly intact.  No pronator drift.  No finger dysmetria.  Symmetric 5/5  strength of all extremities.  Sensation intact to light touch in all extremities.  No nystagmus.  Once sitting on side of the bed from lying flat patient states he is feeling extremely nauseous and dizzy and is unable to ambulate on initial evaluation.    ED Results / Procedures / Treatments  Labs (all labs ordered are listed, but only abnormal results are displayed) Labs Reviewed  BASIC METABOLIC PANEL - Abnormal; Notable for the following components:      Result Value   Glucose, Bld 199 (*)    All other components within normal limits  URINALYSIS, ROUTINE W REFLEX MICROSCOPIC - Abnormal; Notable for the following components:   Glucose, UA 100 (*)    Hgb urine dipstick TRACE (*)    Ketones, ur 15 (*)    Protein, ur 100 (*)    All other components within normal limits  CBG MONITORING, ED - Abnormal; Notable for the following components:   Glucose-Capillary 186 (*)    All other components within normal limits  CBC  URINALYSIS,  MICROSCOPIC (REFLEX)  TROPONIN I (HIGH SENSITIVITY)     EKG  EKG remarkable sinus rhythm with a ventricular rate of 73, right bundle branch block, left anterior fascicle block with otherwise unremarkable intervals and some nonspecific ST changes in anterior and inferior leads without other clear evidence of acute ischemia.   RADIOLOGY  CT head without contrast shows no evidence of hemorrhage, ischemia, edema, mass-effect, ventriculomegaly or other acute process.  This was interpreted by myself.  Also reviewed radiology interpretation and agree with their findings.  MRI brain as well as MRA head and neck ordered and reviewed by myself show no evidence of acute ischemia or large vessel occlusion or dissection.  Also reviewed radiology interpretation and agree with their findings.  They make additional notation of 40% stenosis of the left common carotid.  No other acute abnormalities noted by radiology.  PROCEDURES:  Critical Care performed:  No  Procedures    MEDICATIONS ORDERED IN ED: Medications  diazepam (VALIUM) tablet 5 mg (5 mg Oral Given 09/05/21 1541)  gadobutrol (GADAVIST) 1 MMOL/ML injection 9 mL (9 mLs Intravenous Contrast Given 09/05/21 1639)     IMPRESSION / MDM / ASSESSMENT AND PLAN / ED COURSE  I reviewed the triage vital signs and the nursing notes.                              Differential diagnosis includes, but is not limited to BPPV, CVA, metabolic derangements, anemia and arrhythmia.  EKG remarkable sinus rhythm with a ventricular rate of 73, right bundle branch block, left anterior fascicle block with otherwise unremarkable intervals and some nonspecific ST changes in anterior and inferior leads without other clear evidence of acute ischemia.  Nonelevated troponin, and absence of any chest pain and reassuring EKG is not suggestive of ACS.  CT head without contrast shows no evidence of hemorrhage, ischemia, edema, mass-effect, ventriculomegaly or other acute process.  This was interpreted by myself.  Also reviewed radiology interpretation and agree with their findings.  MRI brain as well as MRA head and neck ordered and reviewed by myself show no evidence of acute ischemia or large vessel occlusion or dissection.  Also reviewed radiology interpretation and agree with their findings.  They make additional notation of 40% stenosis of the left common carotid.  No other acute abnormalities noted by radiology.  CBC without leukocytosis or acute anemia.  BMP without significant electrolyte or metabolic derangements aside from slightly elevated glucose at 199.  UA obtained in triage is unremarkable for evidence of infection.  On reassessment patient states his symptoms have resolved.  He is now able to ambulate unassisted without any difficulty.  No need for further diagnostic or treatment modalities such as Epley maneuver.  At this point I think patient stable for discharge with close outpatient PCP follow-up.   Advised patient that he should have his blood sugar rechecked to see if he needs better control this and to discuss with his PCP if he needs additional dilation of the stenosis seen arteries in his neck which I think is incidental and likely not significant or contributing to his presentation today.  Given improvement in symptoms with otherwise reassuring MRI and exam I think is stable for discharge with outpatient follow-up and I have a low suspicion for immediate life-threatening process.  I suspect likely peripheral etiology for his vertigo.     FINAL CLINICAL IMPRESSION(S) / ED DIAGNOSES   Final diagnoses:  Dizziness  Hyperglycemia  Stenosis of left carotid artery     Rx / DC Orders   ED Discharge Orders          Ordered    meclizine (ANTIVERT) 25 MG tablet  3 times daily PRN        09/05/21 1725             Note:  This document was prepared using Dragon voice recognition software and may include unintentional dictation errors.   Lucrezia Starch, MD 09/05/21 [phone]

## 2021-09-05 NOTE — Discharge Instructions (Addendum)
Your MRI today showed IMPRESSION: 1. No acute intracranial pathology. 2. Atherosclerotic plaque at the left carotid bulb resulting in approximately 40% stenosis. Otherwise, patent vasculature of the head and neck.

## 2021-09-10 ENCOUNTER — Encounter: Payer: Self-pay | Admitting: Family Medicine

## 2021-09-17 NOTE — Progress Notes (Signed)
Established patient visit   Patient: Jeffrey [name]   DOB: 08-07-1956   66 y.o. Male  MRN: [id number] Visit Date: 09/18/2021  Today's healthcare provider: [name], PA-C   Cc. F/u hospitalization  Subjective    HPI   Follow up Hospitalization  Patient was admitted to [facility] on 09/05/21 and discharged on 09/05/21. He was treated for dizziness. Treatment for this included meclizine (ANTIVERT) 25 MG tablet  3 times daily PRN. Has not had the need to take medication, dizziness has resolved.  Telephone follow up was done 09/10/21 via mychart messages He reports good compliance with treatment. He reports this condition is improved. -Would like to know about 40% blockage on left carotid system. MRI of head w/o contrast was done while at ED -----------------------------------------------------------------------------------------    Medications: Outpatient Medications Prior to Visit  Medication Sig   albuterol (PROVENTIL HFA;VENTOLIN HFA) 108 (90 Base) MCG/ACT inhaler Inhale 2 puffs into the lungs every 4 (four) hours as needed for wheezing or shortness of breath.   ALLERGY RELIEF 10 MG tablet TAKE 1 TABLET BY MOUTH  DAILY   amLODipine (NORVASC) 10 MG tablet TAKE 1 TABLET DAILY   aspirin 81 MG tablet Take 81 mg by mouth daily.   blood glucose meter kit and supplies Dispense based on patient and insurance preference. Use up to four times daily as directed. (FOR ICD-10 E10.9, E11.9).   CONTOUR NEXT TEST test strip CHECK BLOOD SUGAR UP TO FOUR TIMES A DAY AS DIRECTED   Dulaglutide (TRULICITY) 1.5 QP/6.1PJ SOPN INJECT THE CONTENTS OF 1 PEN (1.5 MG) UNDER THE SKIN WEEKLY AS DIRECTED   fluticasone (FLONASE) 50 MCG/ACT nasal spray Place 2 sprays into both nostrils daily.   glipiZIDE (GLUCOTROL) 10 MG tablet TAKE 1/2 TABLET BY MOUTH DAILY BEFORE BREAKFAST   losartan (COZAAR) 100 MG tablet TAKE 1 TABLET DAILY   metFORMIN (GLUCOPHAGE) 1000 MG tablet TAKE 1 TABLET TWICE A DAY WITH  MEALS   Microlet Lancets MISC USE UP TO 4 TIMES DAILY AS  DIRECTED   omega-3 acid ethyl esters (LOVAZA) 1 g capsule Take 1 capsule (1 g total) by mouth 2 (two) times daily.   omeprazole (PRILOSEC) 20 MG capsule TAKE 1 CAPSULE DAILY   pravastatin (PRAVACHOL) 40 MG tablet TAKE 1 TABLET DAILY AT 6 P.M.   sulfacetamide (BLEPH-10) 10 % ophthalmic solution Place 2 drops into both eyes 4 (four) times daily.   No facility-administered medications prior to visit.    Review of Systems  Constitutional:  Negative for fatigue and fever.  Respiratory:  Negative for cough and shortness of breath.   Cardiovascular:  Negative for chest pain, palpitations and leg swelling.  Neurological:  Negative for dizziness and headaches.      Objective    Blood pressure 140/71, pulse 65, weight 202 lb 8 oz (91.9 kg), SpO2 98 %.   Physical Exam Constitutional:      General: He is awake.     Appearance: He is well-developed.  HENT:     Head: Normocephalic.  Eyes:     Conjunctiva/sclera: Conjunctivae normal.  Neck:     Vascular: No carotid bruit.  Cardiovascular:     Rate and Rhythm: Normal rate and regular rhythm.     Heart sounds: Normal heart sounds.  Pulmonary:     Effort: Pulmonary effort is normal.     Breath sounds: Normal breath sounds.  Skin:    General: Skin is warm.  Neurological:  Mental Status: He is alert and oriented to person, place, and time.  Psychiatric:        Attention and Perception: Attention normal.        Mood and Affect: Mood normal.        Speech: Speech normal.        Behavior: Behavior is cooperative.     No results found for any visits on 09/18/21.  Assessment & Plan     Problem List Items Addressed This Visit       Cardiovascular and Mediastinum   Carotid stenosis, asymptomatic, left - Primary    Educated pt on carotid stenosis, how it forms, and significance of 40%.  Advised at 50% we consider surgical therapy. Currently he is on the preferred medical  therapy-- statin with LDL < 70 and ASA 81 mg daily.  Will monitor stenosis yearly w/ [place].         Return in about 2 weeks (around 10/02/2021) for DMII.      I, [name], PA-C have reviewed all documentation for this visit. The documentation on  09/18/2021 for the exam, diagnosis, procedures, and orders are all accurate and complete.    [name], PA-C  [facility] [phone] (phone) [phone] (fax)  [place]

## 2021-09-18 ENCOUNTER — Other Ambulatory Visit: Payer: Self-pay

## 2021-09-18 ENCOUNTER — Encounter: Payer: Self-pay | Admitting: Physician Assistant

## 2021-09-18 ENCOUNTER — Ambulatory Visit: Payer: Medicare Other | Admitting: Physician Assistant

## 2021-09-18 VITALS — BP 140/71 | HR 65 | Wt 202.5 lb

## 2021-09-18 DIAGNOSIS — I6522 Occlusion and stenosis of left carotid artery: Secondary | ICD-10-CM

## 2021-09-18 NOTE — Assessment & Plan Note (Signed)
Educated pt on carotid stenosis, how it forms, and significance of 40%.  Advised at 50% we consider surgical therapy. Currently he is on the preferred medical therapy-- statin with LDL < 70 and ASA 81 mg daily.  Will monitor stenosis yearly w/ [place].

## 2021-09-28 NOTE — Progress Notes (Signed)
Established patient visit  I,[name],acting as a scribe for Jeffrey Durie, MD.,have documented all relevant documentation on the behalf of Jeffrey Durie, MD,as directed by  Jeffrey Durie, MD while in the presence of Jeffrey Durie, MD.   Patient: Jeffrey [name]   DOB: [birth year]   66 y.o. Male  MRN: [id number] Visit Date: 10/02/2021  Today's healthcare provider: [name], MD   Chief Complaint  Patient presents with   Follow-up   Hypertension   Diabetes   Hyperlipidemia   Subjective    HPI  Patient comes in today for follow-up.  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  Diabetes Mellitus Type II, follow-up  Lab Results  Component Value Date   HGBA1C 8.3 (H) 05/07/2021   HGBA1C 7.0 (A) 12/14/2020   HGBA1C 7.3 (H) 07/13/2020   Last seen for diabetes 5 months ago.  Management since then includes continuing the same treatment. He reports good compliance with treatment. He is not having side effects. none  Home blood sugar records: fasting range: 200  Episodes of hypoglycemia? No none   Current insulin regiment: Trulicity Most Recent Eye Exam: 06/13/2020  --------------------------------------------------------------------------------------------------- Hypertension, follow-up  BP Readings from Last 3 Encounters:  10/02/21 (!) 159/83  09/18/21 140/71  09/05/21 (!) 147/72   Wt Readings from Last 3 Encounters:  10/02/21 202 lb (91.6 kg)  09/18/21 202 lb 8 oz (91.9 kg)  09/05/21 203 lb 14.8 oz (92.5 kg)     He was last seen for hypertension 5 months ago.  BP at that visit was 130/78. Management since that visit includes; on amlodipine and losartan. He reports good compliance with treatment. He is not having side effects. none He is not exercising. He is adherent to low salt diet.   Outside blood pressures are normal.  He does not smoke.  Use of agents  associated with hypertension: none.   --------------------------------------------------------------------------------------------------- Lipid/Cholesterol, follow-up  Last Lipid Panel: Lab Results  Component Value Date   CHOL 146 05/07/2021   LDLCALC 45 05/07/2021   HDL 27 (L) 05/07/2021   TRIG 512 (H) 05/07/2021    He was last seen for this 5 months ago.  Management since that visit includes; on pravastatin.  He reports good compliance with treatment. He is not having side effects. none  He is following a Regular diet. Current exercise: none  Last metabolic panel Lab Results  Component Value Date   GLUCOSE 199 (H) 09/05/2021   NA 135 09/05/2021   K 4.3 09/05/2021   BUN 19 09/05/2021   CREATININE 1.02 09/05/2021   EGFR 77 05/07/2021   GFRNONAA >60 09/05/2021   CALCIUM 9.3 09/05/2021   AST 19 05/07/2021   ALT 24 05/07/2021   The 10-year ASCVD risk score (Arnett DK, et al., 2019) is: 45.6%  ---------------------------------------------------------------------------------------------------   Medications: Outpatient Medications Prior to Visit  Medication Sig   albuterol (PROVENTIL HFA;VENTOLIN HFA) 108 (90 Base) MCG/ACT inhaler Inhale 2 puffs into the lungs every 4 (four) hours as needed for wheezing or shortness of breath.   ALLERGY RELIEF 10 MG tablet TAKE 1 TABLET BY MOUTH  DAILY   amLODipine (NORVASC) 10 MG tablet TAKE 1 TABLET DAILY   aspirin 81 MG tablet Take 81 mg by mouth daily.   blood glucose meter kit and supplies Dispense based on patient and insurance preference. Use up to four times daily as directed. (  FOR ICD-10 E10.9, E11.9).   CONTOUR NEXT TEST test strip CHECK BLOOD SUGAR UP TO FOUR TIMES A DAY AS DIRECTED   Dulaglutide (TRULICITY) 1.5 XE/9.4MH SOPN INJECT THE CONTENTS OF 1 PEN (1.5 MG) UNDER THE SKIN WEEKLY AS DIRECTED   fluticasone (FLONASE) 50 MCG/ACT nasal spray Place 2 sprays into both nostrils daily.   glipiZIDE (GLUCOTROL) 10 MG tablet TAKE  1/2 TABLET BY MOUTH DAILY BEFORE BREAKFAST   losartan (COZAAR) 100 MG tablet TAKE 1 TABLET DAILY   meclizine (ANTIVERT) 25 MG tablet Take 25 mg by mouth 3 (three) times daily as needed for dizziness.   metFORMIN (GLUCOPHAGE) 1000 MG tablet TAKE 1 TABLET TWICE A DAY WITH MEALS   Microlet Lancets MISC USE UP TO 4 TIMES DAILY AS  DIRECTED   omega-3 acid ethyl esters (LOVAZA) 1 g capsule Take 1 capsule (1 g total) by mouth 2 (two) times daily.   omeprazole (PRILOSEC) 20 MG capsule TAKE 1 CAPSULE DAILY   pravastatin (PRAVACHOL) 40 MG tablet TAKE 1 TABLET DAILY AT 6 P.M.   sulfacetamide (BLEPH-10) 10 % ophthalmic solution Place 2 drops into both eyes 4 (four) times daily.   No facility-administered medications prior to visit.    Review of Systems  Last hemoglobin A1c Lab Results  Component Value Date   HGBA1C 8.3 (H) 05/07/2021       Objective    BP (!) 159/83 (BP Location: Right Arm, Patient Position: Sitting, Cuff Size: Large)    Pulse 77    Temp 98.4 F (36.9 C) (Temporal)    Resp 16    Ht 5' 7"  (1.702 m)    Wt 202 lb (91.6 kg)    SpO2 97%    BMI 31.64 kg/m  BP Readings from Last 3 Encounters:  10/02/21 (!) 159/83  09/18/21 140/71  09/05/21 (!) 147/72   Wt Readings from Last 3 Encounters:  10/02/21 202 lb (91.6 kg)  09/18/21 202 lb 8 oz (91.9 kg)  09/05/21 203 lb 14.8 oz (92.5 kg)      Physical Exam Vitals and nursing note reviewed. Exam conducted with a chaperone present.  Constitutional:      Appearance: Normal appearance. He is normal weight.  HENT:     Right Ear: Tympanic membrane normal.     Left Ear: Tympanic membrane normal.     Nose: Nose normal.     Mouth/Throat:     Mouth: Mucous membranes are moist.  Cardiovascular:     Rate and Rhythm: Normal rate and regular rhythm.     Pulses: Normal pulses.     Heart sounds: Normal heart sounds.  Pulmonary:     Effort: Pulmonary effort is normal.     Breath sounds: Normal breath sounds.  Abdominal:     General:  Bowel sounds are normal.     Palpations: Abdomen is soft.  Musculoskeletal:     Cervical back: Normal range of motion and neck supple.     Right lower leg: No edema.     Left lower leg: No edema.  Skin:    General: Skin is warm.  Neurological:     General: No focal deficit present.     Mental Status: He is alert.  Psychiatric:        Mood and Affect: Mood normal.        Behavior: Behavior normal.        Thought Content: Thought content normal.        Judgment: Judgment normal.  No results found for any visits on 10/02/21.  Assessment & Plan     1. Type 2 diabetes mellitus with hyperglycemia, without long-term current use of insulin (HCC) Goal A1c less than 7.  Has no hypoglycemia.  Continue metformin at maximum dose in half dose of glipizide and Trulicity. - Hemoglobin A1c  2. Hypertriglyceridemia Watch triglycerides.  Continue Lovaza and pravastatin  3. Essential hypertension Patient wishes to work on diet and exercise as he is taking amlodipine 10 and losartan 100.  4. Class 1 obesity with serious comorbidity and body mass index (BMI) of 32.0 to 32.9 in adult, unspecified obesity type Diet and exercise stressed.    Return in about 4 months (around 01/30/2022).      I, Jeffrey Durie, MD, have reviewed all documentation for this visit. The documentation on 10/02/21 for the exam, diagnosis, procedures, and orders are all accurate and complete.    Areesha Dehaven Cranford Mon, MD  [facility] [phone] (phone) [phone] (fax)  [place]

## 2021-10-02 ENCOUNTER — Ambulatory Visit: Payer: Medicare Other | Admitting: Family Medicine

## 2021-10-02 ENCOUNTER — Other Ambulatory Visit: Payer: Self-pay

## 2021-10-02 ENCOUNTER — Encounter: Payer: Self-pay | Admitting: Family Medicine

## 2021-10-02 VITALS — BP 159/83 | HR 77 | Temp 98.4°F | Resp 16 | Ht 67.0 in | Wt 202.0 lb

## 2021-10-02 DIAGNOSIS — E1165 Type 2 diabetes mellitus with hyperglycemia: Secondary | ICD-10-CM | POA: Diagnosis not present

## 2021-10-02 DIAGNOSIS — I10 Essential (primary) hypertension: Secondary | ICD-10-CM

## 2021-10-02 DIAGNOSIS — E669 Obesity, unspecified: Secondary | ICD-10-CM | POA: Diagnosis not present

## 2021-10-02 DIAGNOSIS — E781 Pure hyperglyceridemia: Secondary | ICD-10-CM | POA: Diagnosis not present

## 2021-10-02 DIAGNOSIS — Z6832 Body mass index (BMI) 32.0-32.9, adult: Secondary | ICD-10-CM

## 2021-10-03 LAB — HEMOGLOBIN A1C
Est. average glucose Bld gHb Est-mCnc: 197 mg/dL
Hgb A1c MFr Bld: 8.5 % — ABNORMAL HIGH (ref 4.8–5.6)

## 2021-10-04 ENCOUNTER — Other Ambulatory Visit: Payer: Self-pay | Admitting: Family Medicine

## 2021-10-04 DIAGNOSIS — E1169 Type 2 diabetes mellitus with other specified complication: Secondary | ICD-10-CM

## 2021-10-04 NOTE — Telephone Encounter (Signed)
Requested Prescriptions  Pending Prescriptions Disp Refills   losartan (COZAAR) 100 MG tablet [Pharmacy Med Name: LOSARTAN TABS 100MG ] 90 tablet 1    Sig: TAKE 1 TABLET DAILY     Cardiovascular:  Angiotensin Receptor Blockers Failed - 10/04/2021  5:27 PM      Failed - Last BP in normal range    BP Readings from Last 1 Encounters:  10/02/21 (!) 159/83         Passed - Cr in normal range and within 180 days    Creatinine, Ser  Date Value Ref Range Status  09/05/2021 1.02 0.61 - 1.24 mg/dL Final         Passed - K in normal range and within 180 days    Potassium  Date Value Ref Range Status  09/05/2021 4.3 3.5 - 5.1 mmol/L Final         Passed - Patient is not pregnant      Passed - Valid encounter within last 6 months    Recent Outpatient Visits          2 days ago Type 2 diabetes mellitus with hyperglycemia, without long-term current use of insulin ([place])   [facility] Jerrol Banana., MD   2 weeks ago Carotid stenosis, asymptomatic, left   [facility] Thedore Mins, [place], PA-C   5 months ago Medicare annual wellness visit, subsequent   [facility] Jerrol Banana., MD   9 months ago Type 2 diabetes mellitus with other specified complication, without long-term current use of insulin [facility])   [facility] Jerrol Banana., MD   1 year ago Type 2 diabetes mellitus with hyperglycemia, without long-term current use of insulin [facility])   [facility] Jerrol Banana., MD      Future Appointments            In 3 months Jerrol Banana., MD [facility]            amLODipine (NORVASC) 10 MG tablet [Pharmacy Med Name: AMLODIPINE BESYLATE TABS 10MG ] 90 tablet 1    Sig: TAKE 1 TABLET DAILY     Cardiovascular: Calcium Channel Blockers 2 Failed - 10/04/2021  5:27 PM      Failed - Last BP in normal range    BP Readings from Last 1 Encounters:  10/02/21 (!) 159/83          Passed - Last Heart Rate in normal range    Pulse Readings from Last 1 Encounters:  10/02/21 77         Passed - Valid encounter within last 6 months    Recent Outpatient Visits          2 days ago Type 2 diabetes mellitus with hyperglycemia, without long-term current use of insulin [facility])   [facility] Jerrol Banana., MD   2 weeks ago Carotid stenosis, asymptomatic, left   [facility] Thedore Mins, [place], PA-C   5 months ago Medicare annual wellness visit, subsequent   [facility] Jerrol Banana., MD   9 months ago Type 2 diabetes mellitus with other specified complication, without long-term current use of insulin [facility])   [facility] Jerrol Banana., MD   1 year ago Type 2 diabetes mellitus with hyperglycemia, without long-term current use of insulin [facility])   [facility] Jerrol Banana., MD      Future Appointments  In 3 months Jerrol Banana., MD [facility]            glipiZIDE (GLUCOTROL) 10 MG tablet [Pharmacy Med Name: GLIPIZIDE TABS 10MG ] 135 tablet 1    Sig: TAKE ONE-HALF (1/2) TABLET DAILY BEFORE BREAKFAST     Endocrinology:  Diabetes - Sulfonylureas Failed - 10/04/2021  5:27 PM      Failed - HBA1C is between 0 and 7.9 and within 180 days    Hgb A1c MFr Bld  Date Value Ref Range Status  10/02/2021 8.5 (H) 4.8 - 5.6 % Final    Comment:             Prediabetes: 5.7 - 6.4          Diabetes: >6.4          Glycemic control for adults with diabetes: <7.0          Passed - Cr in normal range and within 360 days    Creatinine, Ser  Date Value Ref Range Status  09/05/2021 1.02 0.61 - 1.24 mg/dL Final         Passed - Valid encounter within last 6 months    Recent Outpatient Visits          2 days ago Type 2 diabetes mellitus with hyperglycemia, without long-term current use of insulin [facility])   [facility]  Jerrol Banana., MD   2 weeks ago Carotid stenosis, asymptomatic, left   [facility] Thedore Mins, [place], PA-C   5 months ago Medicare annual wellness visit, subsequent   [facility] Jerrol Banana., MD   9 months ago Type 2 diabetes mellitus with other specified complication, without long-term current use of insulin [facility])   [facility] Jerrol Banana., MD   1 year ago Type 2 diabetes mellitus with hyperglycemia, without long-term current use of insulin [facility])   [facility] Jerrol Banana., MD      Future Appointments            In 3 months Jerrol Banana., MD [facility]

## 2021-10-08 ENCOUNTER — Ambulatory Visit: Payer: Medicare Other | Admitting: Family Medicine

## 2021-10-10 ENCOUNTER — Other Ambulatory Visit: Payer: Self-pay

## 2021-10-10 MED ORDER — MICROLET LANCETS MISC: 3 refills | Status: DC

## 2021-10-22 ENCOUNTER — Telehealth: Payer: Self-pay | Admitting: Family Medicine

## 2021-10-22 MED ORDER — MICROLET LANCETS MISC: 3 refills | Status: AC

## 2021-10-22 NOTE — Telephone Encounter (Signed)
[facility] pharmacy faxed refill request for the following medications: ? ?Microlet Lancets MISC ? ? ?Please advise ? ?

## 2021-10-22 NOTE — Telephone Encounter (Signed)
Rx sent 

## 2021-11-02 ENCOUNTER — Encounter: Payer: Self-pay | Admitting: Gastroenterology

## 2021-11-05 ENCOUNTER — Encounter: Payer: Self-pay | Admitting: Gastroenterology

## 2021-11-05 ENCOUNTER — Encounter
Admission: RE | Disposition: A | Discharge status: Home / Self Care | Payer: Self-pay | Source: Home / Self Care | Attending: Gastroenterology

## 2021-11-05 ENCOUNTER — Ambulatory Visit
Admission: RE | Admit: 2021-11-05 | Discharge: 2021-11-05 | Disposition: A | Discharge status: Home / Self Care | Payer: Medicare Other | Attending: Gastroenterology | Admitting: Gastroenterology

## 2021-11-05 DIAGNOSIS — Z538 Procedure and treatment not carried out for other reasons: Secondary | ICD-10-CM | POA: Diagnosis not present

## 2021-11-05 DIAGNOSIS — Z86010 Personal history of colonic polyps: Secondary | ICD-10-CM | POA: Insufficient documentation

## 2021-11-05 HISTORY — DX: Personal history of urinary calculi: Z87.442

## 2021-11-05 SURGERY — COLONOSCOPY WITH PROPOFOL
Anesthesia: General

## 2021-11-05 MED ORDER — SODIUM CHLORIDE 0.9 % IV SOLN: INTRAVENOUS | Status: DC

## 2021-11-05 NOTE — H&P (Signed)
Patient consumed solid food yesterday afternoon and is still having liquid brown stool with sediment. ?Will postpone exam for better prep. ?Pt agreeable and office will call to schedule ? ?Annamaria Helling, DO ?[facility] ?

## 2022-01-01 ENCOUNTER — Other Ambulatory Visit: Payer: Self-pay | Admitting: Family Medicine

## 2022-01-25 NOTE — Progress Notes (Deleted)
Established patient visit   Patient: Jeffrey [name]   DOB: [birth year]   66 y.o. Male  MRN: [id number] Visit Date: 01/28/2022  Today's healthcare provider: [name], MD   No chief complaint on file.  Subjective    HPI  Diabetes Mellitus Type II, follow-up  Lab Results  Component Value Date   HGBA1C 8.5 (H) 10/02/2021   HGBA1C 8.3 (H) 05/07/2021   HGBA1C 7.0 (A) 12/14/2020   Last seen for diabetes 4 months ago.  Management since then includes continuing the same treatment.  Home blood sugar records: fasting range: *** Most Recent Eye Exam: fax sent for results  --------------------------------------------------------------------------------------------------- Hypertension, follow-up  BP Readings from Last 3 Encounters:  10/02/21 (!) 159/83  09/18/21 140/71  09/05/21 (!) 147/72   Wt Readings from Last 3 Encounters:  11/05/21 200 lb (90.7 kg)  10/02/21 202 lb (91.6 kg)  09/18/21 202 lb 8 oz (91.9 kg)     He was last seen for hypertension 4 months ago.  Management since that visit includes; Patient wishes to work on diet and exercise as he is taking amlodipine 10 and losartan 100..  Outside blood pressures are {enter patient reported home BP, or 'not being checked':1}.  --------------------------------------------------------------------------------------------------- Lipid/Cholesterol, follow-up  Last Lipid Panel: Lab Results  Component Value Date   CHOL 146 05/07/2021   LDLCALC 45 05/07/2021   HDL 27 (L) 05/07/2021   TRIG 512 (H) 05/07/2021    He was last seen for this 9 months ago.  Management since that visit includes; Continue Lovaza and pravastatin.  Last metabolic panel Lab Results  Component Value Date   GLUCOSE 199 (H) 09/05/2021   NA 135 09/05/2021   K 4.3 09/05/2021   BUN 19 09/05/2021   CREATININE 1.02 09/05/2021   EGFR 77 05/07/2021   GFRNONAA >60 09/05/2021   CALCIUM 9.3 09/05/2021   AST 19 05/07/2021   ALT 24  05/07/2021   The 10-year ASCVD risk score (Arnett DK, et al., 2019) is: 45.6%  ---------------------------------------------------------------------------------------------------   Medications: Outpatient Medications Prior to Visit  Medication Sig   albuterol (PROVENTIL HFA;VENTOLIN HFA) 108 (90 Base) MCG/ACT inhaler Inhale 2 puffs into the lungs every 4 (four) hours as needed for wheezing or shortness of breath.   ALLERGY RELIEF 10 MG tablet TAKE 1 TABLET BY MOUTH  DAILY   amLODipine (NORVASC) 10 MG tablet TAKE 1 TABLET DAILY   aspirin 81 MG tablet Take 81 mg by mouth daily.   blood glucose meter kit and supplies Dispense based on patient and insurance preference. Use up to four times daily as directed. (FOR ICD-10 E10.9, E11.9).   CONTOUR NEXT TEST test strip CHECK BLOOD SUGAR UP TO FOUR TIMES A DAY AS DIRECTED   Dulaglutide (TRULICITY) 1.5 TW/4.4QK SOPN INJECT THE CONTENTS OF 1 PEN (1.5 MG) UNDER THE SKIN WEEKLY AS DIRECTED   fenofibrate 160 MG tablet Take 160 mg by mouth daily.   fluticasone (FLONASE) 50 MCG/ACT nasal spray Place 2 sprays into both nostrils daily.   Fluticasone Propionate, Inhal, (FLOVENT DISKUS) 50 MCG/ACT AEPB Inhale into the lungs.   glipiZIDE (GLUCOTROL) 10 MG tablet TAKE ONE-HALF (1/2) TABLET DAILY BEFORE BREAKFAST   losartan (COZAAR) 100 MG tablet TAKE 1 TABLET DAILY   meclizine (ANTIVERT) 25 MG tablet Take 25 mg by mouth 3 (three) times daily as needed for dizziness.   metFORMIN (GLUCOPHAGE) 1000 MG tablet TAKE 1 TABLET TWICE A DAY WITH MEALS   Microlet Lancets MISC USE  UP TO 4 TIMES DAILY AS  DIRECTED   omega-3 acid ethyl esters (LOVAZA) 1 g capsule TAKE 1 CAPSULE TWICE A DAY   omeprazole (PRILOSEC) 20 MG capsule TAKE 1 CAPSULE DAILY   pravastatin (PRAVACHOL) 40 MG tablet TAKE 1 TABLET DAILY AT 6 P.M.   sulfacetamide (BLEPH-10) 10 % ophthalmic solution Place 2 drops into both eyes 4 (four) times daily.   No facility-administered medications prior to visit.     Review of Systems  Constitutional:  Negative for appetite change, chills and fever.  Respiratory:  Negative for chest tightness, shortness of breath and wheezing.   Cardiovascular:  Negative for chest pain and palpitations.  Gastrointestinal:  Negative for abdominal pain, nausea and vomiting.    {Labs  Heme  Chem  Endocrine  Serology  Results Review (optional):23779}   Objective    There were no vitals taken for this visit. {Show previous vital signs (optional):23777}  Physical Exam  ***  No results found for any visits on 01/28/22.  Assessment & Plan     ***  No follow-ups on file.      {provider attestation***:1}   [name], MD  [facility] [phone] (phone) [phone] (fax)  [place]

## 2022-01-25 NOTE — Progress Notes (Deleted)
Established patient visit   Patient: Jeffrey [name]   DOB: 1956-06-21   66 y.o. Male  MRN: [id number] Visit Date: 01/28/2022  Today's healthcare provider: [name], MD   No chief complaint on file.  Subjective    HPI  Diabetes Mellitus Type II, Follow-up  Lab Results  Component Value Date   HGBA1C 8.5 (H) 10/02/2021   HGBA1C 8.3 (H) 05/07/2021   HGBA1C 7.0 (A) 12/14/2020   Wt Readings from Last 3 Encounters:  11/05/21 200 lb (90.7 kg)  10/02/21 202 lb (91.6 kg)  09/18/21 202 lb 8 oz (91.9 kg)   Last seen for diabetes 4 months ago.  Management since then includes none. He reports {excellent/good/fair/poor:19665} compliance with treatment. He {is/is not:21021397} having side effects. {document side effects if present:1} Symptoms: {Yes/No:20286} fatigue {Yes/No:20286} foot ulcerations  {Yes/No:20286} appetite changes {Yes/No:20286} nausea  {Yes/No:20286} paresthesia of the feet  {Yes/No:20286} polydipsia  {Yes/No:20286} polyuria {Yes/No:20286} visual disturbances   {Yes/No:20286} vomiting     Home blood sugar records: {diabetes glucometry results:16657}  Episodes of hypoglycemia? {Yes/No:20286} {enter symptoms and frequency of symptoms if yes:1}   Current insulin regiment: {enter 'none' or type of insulin and number of units taken with each dose of each insulin formulation that the patient is taking:1} Most Recent Eye Exam: Patient is due for eye exam Patient is also due for diabetic foot exam.  {Current exercise:16438:::1} {Current diet habits:16563:::1}  Pertinent Labs: Lab Results  Component Value Date   CHOL 146 05/07/2021   HDL 27 (L) 05/07/2021   LDLCALC 45 05/07/2021   TRIG 512 (H) 05/07/2021   CHOLHDL 5.4 (H) 05/07/2021   Lab Results  Component Value Date   NA 135 09/05/2021   K 4.3 09/05/2021   CREATININE 1.02 09/05/2021   GFRNONAA >60 09/05/2021   MICROALBUR 50 07/13/2020      --------------------------------------------------------------------------------------------------- Health Maintenance: Patient is due for colonoscopy  Medications: Outpatient Medications Prior to Visit  Medication Sig   albuterol (PROVENTIL HFA;VENTOLIN HFA) 108 (90 Base) MCG/ACT inhaler Inhale 2 puffs into the lungs every 4 (four) hours as needed for wheezing or shortness of breath.   ALLERGY RELIEF 10 MG tablet TAKE 1 TABLET BY MOUTH  DAILY   amLODipine (NORVASC) 10 MG tablet TAKE 1 TABLET DAILY   aspirin 81 MG tablet Take 81 mg by mouth daily.   blood glucose meter kit and supplies Dispense based on patient and insurance preference. Use up to four times daily as directed. (FOR ICD-10 E10.9, E11.9).   CONTOUR NEXT TEST test strip CHECK BLOOD SUGAR UP TO FOUR TIMES A DAY AS DIRECTED   Dulaglutide (TRULICITY) 1.5 AT/5.5DD SOPN INJECT THE CONTENTS OF 1 PEN (1.5 MG) UNDER THE SKIN WEEKLY AS DIRECTED   fenofibrate 160 MG tablet Take 160 mg by mouth daily.   fluticasone (FLONASE) 50 MCG/ACT nasal spray Place 2 sprays into both nostrils daily.   Fluticasone Propionate, Inhal, (FLOVENT DISKUS) 50 MCG/ACT AEPB Inhale into the lungs.   glipiZIDE (GLUCOTROL) 10 MG tablet TAKE ONE-HALF (1/2) TABLET DAILY BEFORE BREAKFAST   losartan (COZAAR) 100 MG tablet TAKE 1 TABLET DAILY   meclizine (ANTIVERT) 25 MG tablet Take 25 mg by mouth 3 (three) times daily as needed for dizziness.   metFORMIN (GLUCOPHAGE) 1000 MG tablet TAKE 1 TABLET TWICE A DAY WITH MEALS   Microlet Lancets MISC USE UP TO 4 TIMES DAILY AS  DIRECTED   omega-3 acid ethyl esters (LOVAZA) 1 g capsule TAKE 1 CAPSULE  TWICE A DAY   omeprazole (PRILOSEC) 20 MG capsule TAKE 1 CAPSULE DAILY   pravastatin (PRAVACHOL) 40 MG tablet TAKE 1 TABLET DAILY AT 6 P.M.   sulfacetamide (BLEPH-10) 10 % ophthalmic solution Place 2 drops into both eyes 4 (four) times daily.   No facility-administered medications prior to visit.    Review of  Systems  {Labs  Heme  Chem  Endocrine  Serology  Results Review (optional):23779}   Objective    There were no vitals taken for this visit. {Show previous vital signs (optional):23777}  Physical Exam  ***  No results found for any visits on 01/28/22.  Assessment & Plan     ***  No follow-ups on file.      {provider attestation***:1}   [name], MD  [facility] [phone] (phone) [phone] (fax)  [place]

## 2022-01-28 ENCOUNTER — Ambulatory Visit: Payer: Medicare Other | Admitting: Family Medicine

## 2022-01-28 DIAGNOSIS — E1165 Type 2 diabetes mellitus with hyperglycemia: Secondary | ICD-10-CM

## 2022-01-28 DIAGNOSIS — E781 Pure hyperglyceridemia: Secondary | ICD-10-CM

## 2022-01-30 ENCOUNTER — Encounter: Payer: Self-pay | Admitting: Family Medicine

## 2022-02-22 ENCOUNTER — Encounter: Payer: Self-pay | Admitting: Gastroenterology

## 2022-02-24 ENCOUNTER — Encounter: Payer: Self-pay | Admitting: Gastroenterology

## 2022-02-24 NOTE — H&P (Signed)
Pre-Procedure H&P   Patient ID: Jeffrey [name] is a 66 y.o. male.  Gastroenterology Provider: Annamaria Helling, DO  Referring Provider: Laurine Blazer, PA PCP: Jerrol Banana., MD  Date: 02/25/2022  HPI Jeffrey [name] is a 66 y.o. male who presents today for Colonoscopy for Surveillance-personal history and family history of colon polyps. Patient last underwent colonoscopy in February 2017 demonstrating hepatic flexure lipoma approximately 20 mm in size, pandiverticulosis and internal hemorrhoids.  Last had a polyp per report in 2006.  Had repeat colonoscopy in February 2012 without any polyps.  Patient having daily bowel movement without melena hematochezia constipation or diarrhea.  Most recent lab work hemoglobin 15.5 platelets 252,000 creatinine 1.0 A1c 8.5  Mother with history of colon polyps   Past Medical History:  Diagnosis Date   Actinic keratosis    Cholecystitis    Diabetes mellitus (HCC)    Diverticulosis    Erectile dysfunction    GERD (gastroesophageal reflux disease)    History of kidney stones    Hyperlipidemia    Hypertension    Myalgia    Obesity    Peyronie's disease    Plantar fasciitis    Seasonal allergies     Past Surgical History:  Procedure Laterality Date   ARTHROSCOPIC REPAIR ACL     HERNIA REPAIR     x 2   LITHOTRIPSY     MENISCUS REPAIR      Family History Mother with history of colon polyps No other h/o GI disease or malignancy  Review of Systems  Constitutional:  Negative for activity change, appetite change, chills, diaphoresis, fatigue, fever and unexpected weight change.  HENT:  Negative for trouble swallowing and voice change.   Respiratory:  Negative for shortness of breath and wheezing.   Cardiovascular:  Negative for chest pain, palpitations and leg swelling.  Gastrointestinal:  Negative for abdominal distention, abdominal pain, anal bleeding, blood in stool, constipation, diarrhea, nausea and  vomiting.  Musculoskeletal:  Negative for arthralgias and myalgias.  Skin:  Negative for color change and pallor.  Neurological:  Negative for dizziness, syncope and weakness.  Psychiatric/Behavioral:  Negative for confusion. The patient is not nervous/anxious.   All other systems reviewed and are negative.    Medications No current facility-administered medications on file prior to encounter.   Current Outpatient Medications on File Prior to Encounter  Medication Sig Dispense Refill   albuterol (PROVENTIL HFA;VENTOLIN HFA) 108 (90 Base) MCG/ACT inhaler Inhale 2 puffs into the lungs every 4 (four) hours as needed for wheezing or shortness of breath.     amLODipine (NORVASC) 10 MG tablet TAKE 1 TABLET DAILY 90 tablet 1   aspirin 81 MG tablet Take 81 mg by mouth daily.     Dulaglutide (TRULICITY) 1.5 NK/5.3ZJ SOPN INJECT THE CONTENTS OF 1 PEN (1.5 MG) UNDER THE SKIN WEEKLY AS DIRECTED 6 mL 3   glipiZIDE (GLUCOTROL) 10 MG tablet TAKE ONE-HALF (1/2) TABLET DAILY BEFORE BREAKFAST 135 tablet 1   losartan (COZAAR) 100 MG tablet TAKE 1 TABLET DAILY 90 tablet 1   metFORMIN (GLUCOPHAGE) 1000 MG tablet TAKE 1 TABLET TWICE A DAY WITH MEALS 180 tablet 3   omeprazole (PRILOSEC) 20 MG capsule TAKE 1 CAPSULE DAILY 90 capsule 3   ALLERGY RELIEF 10 MG tablet TAKE 1 TABLET BY MOUTH  DAILY 90 tablet 3   blood glucose meter kit and supplies Dispense based on patient and insurance preference. Use up to four times daily as directed. (  FOR ICD-10 E10.9, E11.9). 1 each 1   CONTOUR NEXT TEST test strip CHECK BLOOD SUGAR UP TO FOUR TIMES A DAY AS DIRECTED 400 strip 2   fenofibrate 160 MG tablet Take 160 mg by mouth daily.     fluticasone (FLONASE) 50 MCG/ACT nasal spray Place 2 sprays into both nostrils daily. 16 g 12   Fluticasone Propionate, Inhal, (FLOVENT DISKUS) 50 MCG/ACT AEPB Inhale into the lungs.     meclizine (ANTIVERT) 25 MG tablet Take 25 mg by mouth 3 (three) times daily as needed for dizziness.      Microlet Lancets MISC USE UP TO 4 TIMES DAILY AS  DIRECTED 200 each 3   pravastatin (PRAVACHOL) 40 MG tablet TAKE 1 TABLET DAILY AT 6 P.M. 90 tablet 3   sulfacetamide (BLEPH-10) 10 % ophthalmic solution Place 2 drops into both eyes 4 (four) times daily. 15 mL 0    Pertinent medications related to GI and procedure were reviewed by me with the patient prior to the procedure   Current Facility-Administered Medications:    0.9 %  sodium chloride infusion, , Intravenous, Continuous, Annamaria Helling, DO, Last Rate: 20 mL/hr at 02/25/22 0937, New Bag at 02/25/22 6812      Allergies  Allergen Reactions   Codeine Hives   Allergies were reviewed by me prior to the procedure  Objective   Body mass index is 32.28 kg/m. Vitals:   02/25/22 0924  BP: (!) 162/89  Pulse: 60  Resp: 16  Temp: (!) 96.3 F (35.7 C)  TempSrc: Temporal  SpO2: 100%  Weight: 90.7 kg  Height: 5' 6"  (1.676 m)     Physical Exam Vitals and nursing note reviewed.  Constitutional:      General: He is not in acute distress.    Appearance: Normal appearance. He is not ill-appearing, toxic-appearing or diaphoretic.  HENT:     Head: Normocephalic and atraumatic.     Nose: Nose normal.     Mouth/Throat:     Mouth: Mucous membranes are moist.     Pharynx: Oropharynx is clear.  Eyes:     General: No scleral icterus.    Extraocular Movements: Extraocular movements intact.  Cardiovascular:     Rate and Rhythm: Normal rate and regular rhythm.     Heart sounds: Normal heart sounds. No murmur heard.    No friction rub. No gallop.  Pulmonary:     Effort: Pulmonary effort is normal. No respiratory distress.     Breath sounds: Normal breath sounds. No wheezing, rhonchi or rales.  Abdominal:     General: Bowel sounds are normal. There is no distension.     Palpations: Abdomen is soft.     Tenderness: There is no abdominal tenderness. There is no guarding or rebound.  Musculoskeletal:     Cervical back: Neck  supple.     Right lower leg: No edema.     Left lower leg: No edema.  Skin:    General: Skin is warm and dry.     Coloration: Skin is not jaundiced or pale.  Neurological:     General: No focal deficit present.     Mental Status: He is alert and oriented to person, place, and time. Mental status is at baseline.  Psychiatric:        Mood and Affect: Mood normal.        Behavior: Behavior normal.        Thought Content: Thought content normal.  Judgment: Judgment normal.      Assessment:  [name] is a 66 y.o. male  who presents today for Colonoscopy for Surveillance-personal history and family history of colon polyps.  Plan:  Colonoscopy with possible intervention today  Colonoscopy with possible biopsy, control of bleeding, polypectomy, and interventions as necessary has been discussed with the patient/patient representative. Informed consent was obtained from the patient/patient representative after explaining the indication, nature, and risks of the procedure including but not limited to death, bleeding, perforation, missed neoplasm/lesions, cardiorespiratory compromise, and reaction to medications. Opportunity for questions was given and appropriate answers were provided. Patient/patient representative has verbalized understanding is amenable to undergoing the procedure.   Annamaria Helling, DO  [facility]  Portions of the record may have been created with voice recognition software. Occasional wrong-word or 'sound-a-like' substitutions may have occurred due to the inherent limitations of voice recognition software.  Read the chart carefully and recognize, using context, where substitutions may have occurred.

## 2022-02-25 ENCOUNTER — Ambulatory Visit
Admission: RE | Admit: 2022-02-25 | Discharge: 2022-02-25 | Disposition: A | Discharge status: Home / Self Care | Payer: Medicare Other | Attending: Gastroenterology | Admitting: Gastroenterology

## 2022-02-25 ENCOUNTER — Encounter
Admission: RE | Disposition: A | Discharge status: Home / Self Care | Payer: Self-pay | Source: Home / Self Care | Attending: Gastroenterology

## 2022-02-25 ENCOUNTER — Ambulatory Visit: Payer: Medicare Other | Admitting: Certified Registered Nurse Anesthetist

## 2022-02-25 ENCOUNTER — Encounter: Payer: Self-pay | Admitting: Gastroenterology

## 2022-02-25 DIAGNOSIS — I10 Essential (primary) hypertension: Secondary | ICD-10-CM | POA: Diagnosis not present

## 2022-02-25 DIAGNOSIS — Z86010 Personal history of colonic polyps: Secondary | ICD-10-CM | POA: Diagnosis not present

## 2022-02-25 DIAGNOSIS — D123 Benign neoplasm of transverse colon: Secondary | ICD-10-CM | POA: Diagnosis not present

## 2022-02-25 DIAGNOSIS — K5730 Diverticulosis of large intestine without perforation or abscess without bleeding: Secondary | ICD-10-CM | POA: Diagnosis not present

## 2022-02-25 DIAGNOSIS — E669 Obesity, unspecified: Secondary | ICD-10-CM | POA: Insufficient documentation

## 2022-02-25 DIAGNOSIS — K649 Unspecified hemorrhoids: Secondary | ICD-10-CM | POA: Diagnosis not present

## 2022-02-25 DIAGNOSIS — D122 Benign neoplasm of ascending colon: Secondary | ICD-10-CM | POA: Diagnosis not present

## 2022-02-25 DIAGNOSIS — E119 Type 2 diabetes mellitus without complications: Secondary | ICD-10-CM | POA: Insufficient documentation

## 2022-02-25 DIAGNOSIS — Z6832 Body mass index (BMI) 32.0-32.9, adult: Secondary | ICD-10-CM | POA: Insufficient documentation

## 2022-02-25 DIAGNOSIS — Z1211 Encounter for screening for malignant neoplasm of colon: Secondary | ICD-10-CM | POA: Insufficient documentation

## 2022-02-25 DIAGNOSIS — Z87891 Personal history of nicotine dependence: Secondary | ICD-10-CM | POA: Insufficient documentation

## 2022-02-25 DIAGNOSIS — K641 Second degree hemorrhoids: Secondary | ICD-10-CM | POA: Diagnosis not present

## 2022-02-25 DIAGNOSIS — D125 Benign neoplasm of sigmoid colon: Secondary | ICD-10-CM | POA: Diagnosis not present

## 2022-02-25 DIAGNOSIS — K635 Polyp of colon: Secondary | ICD-10-CM | POA: Diagnosis not present

## 2022-02-25 DIAGNOSIS — D124 Benign neoplasm of descending colon: Secondary | ICD-10-CM | POA: Diagnosis not present

## 2022-02-25 DIAGNOSIS — D175 Benign lipomatous neoplasm of intra-abdominal organs: Secondary | ICD-10-CM | POA: Diagnosis not present

## 2022-02-25 HISTORY — PX: COLONOSCOPY: SHX5424

## 2022-02-25 LAB — GLUCOSE, CAPILLARY: Glucose-Capillary: 191 mg/dL — ABNORMAL HIGH (ref 70–99)

## 2022-02-25 SURGERY — COLONOSCOPY
Anesthesia: General

## 2022-02-25 MED ORDER — LIDOCAINE HCL (PF) 2 % IJ SOLN
INTRAMUSCULAR | Status: AC
  Filled 2022-02-25: qty 5

## 2022-02-25 MED ORDER — PHENYLEPHRINE 80 MCG/ML (10ML) SYRINGE FOR IV PUSH (FOR BLOOD PRESSURE SUPPORT)
PREFILLED_SYRINGE | INTRAVENOUS | Status: AC
  Filled 2022-02-25: qty 10

## 2022-02-25 MED ORDER — GLYCOPYRROLATE 0.2 MG/ML IJ SOLN
INTRAMUSCULAR | Status: DC | PRN
  Administered 2022-02-25: .1 mg via INTRAVENOUS

## 2022-02-25 MED ORDER — SODIUM CHLORIDE 0.9 % IV SOLN: INTRAVENOUS | Status: DC | PRN

## 2022-02-25 MED ORDER — PROPOFOL 500 MG/50ML IV EMUL
INTRAVENOUS | Status: DC | PRN
  Administered 2022-02-25 (×2): 10 mg via INTRAVENOUS
  Administered 2022-02-25: 150 ug/kg/min via INTRAVENOUS
  Administered 2022-02-25 (×2): 10 mg via INTRAVENOUS
  Administered 2022-02-25: 80 mg via INTRAVENOUS

## 2022-02-25 MED ORDER — GLYCOPYRROLATE 0.2 MG/ML IJ SOLN
INTRAMUSCULAR | Status: AC
  Filled 2022-02-25: qty 2

## 2022-02-25 MED ORDER — EPHEDRINE 5 MG/ML INJ
INTRAVENOUS | Status: AC
  Filled 2022-02-25: qty 5

## 2022-02-25 MED ORDER — SODIUM CHLORIDE 0.9 % IV SOLN: INTRAVENOUS | Status: DC

## 2022-02-25 NOTE — Anesthesia Preprocedure Evaluation (Signed)
Anesthesia Evaluation  Patient identified by MRN, date of birth, ID band Patient awake    Reviewed: Allergy & Precautions, NPO status , Patient's Chart, lab work & pertinent test results  Airway Mallampati: III  TM Distance: >3 FB Neck ROM: full    Dental  (+) Teeth Intact   Pulmonary asthma , former smoker,    Pulmonary exam normal        Cardiovascular hypertension, negative cardio ROS Normal cardiovascular exam     Neuro/Psych negative neurological ROS  negative psych ROS   GI/Hepatic negative GI ROS, Neg liver ROS,   Endo/Other  negative endocrine ROSdiabetes  Renal/GU negative Renal ROS  negative genitourinary   Musculoskeletal   Abdominal   Peds  Hematology negative hematology ROS (+)   Anesthesia Other Findings Past Medical History: No date: Actinic keratosis No date: Cholecystitis No date: Diabetes mellitus (HCC) No date: Diverticulosis No date: Erectile dysfunction No date: GERD (gastroesophageal reflux disease) No date: History of kidney stones No date: Hyperlipidemia No date: Hypertension No date: Myalgia No date: Obesity No date: Peyronie's disease No date: Plantar fasciitis No date: Seasonal allergies  Past Surgical History: No date: ARTHROSCOPIC REPAIR ACL No date: HERNIA REPAIR     Comment:  x 2 No date: LITHOTRIPSY No date: MENISCUS REPAIR  BMI    Body Mass Index: 32.28 kg/m      Reproductive/Obstetrics negative OB ROS                             Anesthesia Physical Anesthesia Plan  ASA: 2  Anesthesia Plan: General   Post-op Pain Management: Minimal or no pain anticipated   Induction: Intravenous  PONV Risk Score and Plan: 3 and Propofol infusion, TIVA and Ondansetron  Airway Management Planned: Nasal Cannula  Additional Equipment: None  Intra-op Plan:   Post-operative Plan:   Informed Consent: I have reviewed the patients History and  Physical, chart, labs and discussed the procedure including the risks, benefits and alternatives for the proposed anesthesia with the patient or authorized representative who has indicated his/her understanding and acceptance.     Dental advisory given  Plan Discussed with: CRNA and Surgeon  Anesthesia Plan Comments: (Discussed risks of anesthesia with patient, including possibility of difficulty with spontaneous ventilation under anesthesia necessitating airway intervention, PONV, and rare risks such as cardiac or respiratory or neurological events, and allergic reactions. Discussed the role of CRNA in patient's perioperative care. Patient understands.)        Anesthesia Quick Evaluation

## 2022-02-25 NOTE — Anesthesia Postprocedure Evaluation (Signed)
Anesthesia Post Note  Patient: Jeffrey [name]  Procedure(s) Performed: COLONOSCOPY  Patient location during evaluation: Endoscopy Anesthesia Type: General Level of consciousness: awake and alert Pain management: pain level controlled Vital Signs Assessment: post-procedure vital signs reviewed and stable Respiratory status: spontaneous breathing, nonlabored ventilation, respiratory function stable and patient connected to nasal cannula oxygen Cardiovascular status: blood pressure returned to baseline and stable Postop Assessment: no apparent nausea or vomiting Anesthetic complications: no   No notable events documented.   Last Vitals:  Vitals:   02/25/22 1044 02/25/22 1054  BP: 121/63 139/67  Pulse: 62 (!) 55  Resp: 14 14  Temp: (!) 36.2 C   SpO2: 98% 99%    Last Pain:  Vitals:   02/25/22 1054  TempSrc:   PainSc: 0-No pain                 Dimas Millin

## 2022-02-25 NOTE — Transfer of Care (Signed)
Immediate Anesthesia Transfer of Care Note  Patient: Jeffrey [name]  Procedure(s) Performed: COLONOSCOPY  Patient Location: PACU and Endoscopy Unit  Anesthesia Type:General  Level of Consciousness: drowsy  Airway & Oxygen Therapy: Patient Spontanous Breathing  Post-op Assessment: Report given to RN  Post vital signs: Reviewed and stable  Last Vitals:  Vitals Value Taken Time  BP    Temp    Pulse 62 02/25/22 1044  Resp 14 02/25/22 1044  SpO2 98 % 02/25/22 1044  Vitals shown include unvalidated device data.  Last Pain:  Vitals:   02/25/22 0924  TempSrc: Temporal         Complications: No notable events documented.

## 2022-02-25 NOTE — Interval H&P Note (Signed)
History and Physical Interval Note: Preprocedure H&P from 02/25/22  was reviewed and there was no interval change after seeing and examining the patient.  Written consent was obtained from the patient after discussion of risks, benefits, and alternatives. Patient has consented to proceed with Colonoscopy with possible intervention   02/25/2022 9:42 AM  [name]  has presented today for surgery, with the diagnosis of Personal history of colonic polyps (Z86.010).  The various methods of treatment have been discussed with the patient and family. After consideration of risks, benefits and other options for treatment, the patient has consented to  Procedure(s) with comments: COLONOSCOPY (N/A) - DM as a surgical intervention.  The patient's history has been reviewed, patient examined, no change in status, stable for surgery.  I have reviewed the patient's chart and labs.  Questions were answered to the patient's satisfaction.     Annamaria Helling

## 2022-02-25 NOTE — Op Note (Signed)
[facility] Gastroenterology Patient Name: Jeffrey [name] Procedure Date: 02/25/2022 9:16 AM MRN: [id number] Account #: [id number] Date of Birth: 10-17-1955 Admit Type: Outpatient Age: 66 Room: [facility] ENDO ROOM 2 Gender: Male Note Status: Finalized Instrument Name: Colonoscope [id number] Procedure:             Colonoscopy Indications:           High risk colon cancer surveillance: Personal history                         of colonic polyps Providers:             Rueben Bash, DO Referring MD:          Annamaria Helling DO, DO (Referring MD), Janine Ores.                         Rosanna Randy, MD (Referring MD) Medicines:             Monitored Anesthesia Care Complications:         No immediate complications. Estimated blood loss:                         Minimal. Procedure:             Pre-Anesthesia Assessment:                        - Prior to the procedure, a History and Physical was                         performed, and patient medications and allergies were                         reviewed. The patient is competent. The risks and                         benefits of the procedure and the sedation options and                         risks were discussed with the patient. All questions                         were answered and informed consent was obtained.                         Patient identification and proposed procedure were                         verified by the physician, the nurse, the anesthetist                         and the technician in the endoscopy suite. Mental                         Status Examination: alert and oriented. Airway                         Examination: normal oropharyngeal airway and neck  mobility. Respiratory Examination: clear to                         auscultation. CV Examination: RRR, no murmurs, no S3                         or S4. Prophylactic Antibiotics: The patient does not                          require prophylactic antibiotics. Prior                         Anticoagulants: The patient has taken no previous                         anticoagulant or antiplatelet agents. ASA Grade                         Assessment: II - A patient with mild systemic disease.                         After reviewing the risks and benefits, the patient                         was deemed in satisfactory condition to undergo the                         procedure. The anesthesia plan was to use monitored                         anesthesia care (MAC). Immediately prior to                         administration of medications, the patient was                         re-assessed for adequacy to receive sedatives. The                         heart rate, respiratory rate, oxygen saturations,                         blood pressure, adequacy of pulmonary ventilation, and                         response to care were monitored throughout the                         procedure. The physical status of the patient was                         re-assessed after the procedure.                        After obtaining informed consent, the colonoscope was                         passed under direct vision. Throughout the procedure,  the patient's blood pressure, pulse, and oxygen                         saturations were monitored continuously. The                         Colonoscope was introduced through the anus and                         advanced to the the terminal ileum, with                         identification of the appendiceal orifice and IC                         valve. The colonoscopy was performed without                         difficulty. The patient tolerated the procedure well.                         The quality of the bowel preparation was evaluated                         using the BBPS [facility] Bowel Preparation Scale) with                         scores of: Right Colon = 2  (minor amount of residual                         staining, small fragments of stool and/or opaque                         liquid, but mucosa seen well), Transverse Colon = 3                         (entire mucosa seen well with no residual staining,                         small fragments of stool or opaque liquid) and Left                         Colon = 2 (minor amount of residual staining, small                         fragments of stool and/or opaque liquid, but mucosa                         seen well). The total BBPS score equals 7. The quality                         of the bowel preparation was good. The terminal ileum,                         ileocecal valve, appendiceal orifice, and rectum were  photographed. Findings:      The perianal and digital rectal examinations were normal. Pertinent       negatives include normal sphincter tone.      The terminal ileum appeared normal. Estimated blood loss: none.      Multiple small-mouthed diverticula were found in the entire colon.       Estimated blood loss: none.      Non-bleeding internal hemorrhoids were found during retroflexion. The       hemorrhoids were Grade II (internal hemorrhoids that prolapse but reduce       spontaneously). Estimated blood loss: none.      Eight sessile polyps were found in the sigmoid colon, descending colon,       transverse colon and ascending colon. The polyps were 3 to 5 mm in size.       These polyps were removed with a cold snare. Resection and retrieval       were complete. Estimated blood loss was minimal.      Two sessile polyps were found in the descending colon and transverse       colon. The polyps were 1 to 2 mm in size. These polyps were removed with       a jumbo cold forceps. Resection and retrieval were complete. Estimated       blood loss was minimal.      There was a small lipoma, 10 mm in diameter, at the hepatic flexure.       Estimated blood loss: none.  Previously documetned and biopsied on 2017       colonoscopy      The exam was otherwise without abnormality on direct and retroflexion       views. Impression:            - The examined portion of the ileum was normal.                        - Diverticulosis in the entire examined colon.                        - Non-bleeding internal hemorrhoids.                        - Eight 3 to 5 mm polyps in the sigmoid colon, in the                         descending colon, in the transverse colon and in the                         ascending colon, removed with a cold snare. Resected                         and retrieved.                        - Two 1 to 2 mm polyps in the descending colon and in                         the transverse colon, removed with a jumbo cold                         forceps. Resected and retrieved.                        -  Small lipoma at the hepatic flexure.                        - The examination was otherwise normal on direct and                         retroflexion views. Recommendation:        - Discharge patient to home.                        - Resume previous diet.                        - Continue present medications.                        - No aspirin, ibuprofen, naproxen, or other                         non-steroidal anti-inflammatory drugs for 5 days after                         polyp removal.                        - Await pathology results.                        - Repeat colonoscopy for surveillance based on                         pathology results.                        - Return to referring physician as previously                         scheduled.                        - The findings and recommendations were discussed with                         the patient. Procedure Code(s):     --- Professional ---                        [phone], Colonoscopy, flexible; with removal of                         tumor(s), polyp(s), or other lesion(s) by snare                          technique                        [id number], 78, Colonoscopy, flexible; with biopsy, single                         or multiple Diagnosis Code(s):     --- Professional ---                        Z86.010, Personal history of  colonic polyps                        K64.1, Second degree hemorrhoids                        K63.5, Polyp of colon                        D17.5, Benign lipomatous neoplasm of intra-abdominal                         organs                        K57.30, Diverticulosis of large intestine without                         perforation or abscess without bleeding CPT copyright 2019 American Medical Association. All rights reserved. The codes documented in this report are preliminary and upon coder review may  be revised to meet current compliance requirements. Attending Participation:      I personally performed the entire procedure. Volney American, DO Annamaria Helling DO, DO 02/25/2022 10:51:01 AM This report has been signed electronically. Number of Addenda: 0 Note Initiated On: 02/25/2022 9:16 AM Scope Withdrawal Time: 0 hours 38 minutes 8 seconds  Total Procedure Duration: 0 hours 50 minutes 13 seconds  Estimated Blood Loss:  Estimated blood loss was minimal.      [facility]

## 2022-02-26 ENCOUNTER — Encounter: Payer: Self-pay | Admitting: Gastroenterology

## 2022-02-26 LAB — SURGICAL PATHOLOGY

## 2022-03-14 ENCOUNTER — Ambulatory Visit (INDEPENDENT_AMBULATORY_CARE_PROVIDER_SITE_OTHER): Payer: Medicare Other | Admitting: Family Medicine

## 2022-03-14 ENCOUNTER — Encounter: Payer: Self-pay | Admitting: Family Medicine

## 2022-03-14 VITALS — BP 152/74 | HR 62 | Resp 16 | Wt 199.0 lb

## 2022-03-14 DIAGNOSIS — E669 Obesity, unspecified: Secondary | ICD-10-CM

## 2022-03-14 DIAGNOSIS — I10 Essential (primary) hypertension: Secondary | ICD-10-CM | POA: Diagnosis not present

## 2022-03-14 DIAGNOSIS — E1165 Type 2 diabetes mellitus with hyperglycemia: Secondary | ICD-10-CM

## 2022-03-14 DIAGNOSIS — E781 Pure hyperglyceridemia: Secondary | ICD-10-CM | POA: Diagnosis not present

## 2022-03-14 DIAGNOSIS — Z6832 Body mass index (BMI) 32.0-32.9, adult: Secondary | ICD-10-CM

## 2022-03-14 LAB — POCT GLYCOSYLATED HEMOGLOBIN (HGB A1C)
Est. average glucose Bld gHb Est-mCnc: 180
Hemoglobin A1C: 7.9 % — AB (ref 4.0–5.6)

## 2022-03-14 MED ORDER — TRIAMTERENE-HCTZ 37.5-25 MG PO CAPS: 1.0000 | ORAL_CAPSULE | ORAL | 3 refills | Status: AC

## 2022-03-14 NOTE — Progress Notes (Signed)
Established patient visit  I,[name],acting as a scribe for [name], MD.,have documented all relevant documentation on the behalf of [name], MD,as directed by  [name], MD while in the presence of [name], MD.   Patient: Jeffrey [name]   DOB: 1956/08/02   66 y.o. Male  MRN: [id number] Visit Date: 03/14/2022  Today's healthcare provider: [name], MD   Chief Complaint  Patient presents with   Follow-up   Diabetes   Hypertension   Hyperlipidemia   Subjective    HPI  She now retired.  He feels well.  He has no specific complaints. He is taking his medications.  Diabetes Mellitus Type II, follow-up  Lab Results  Component Value Date   HGBA1C 7.9 (A) 03/14/2022   HGBA1C 8.5 (H) 10/02/2021   HGBA1C 8.3 (H) 05/07/2021   Last seen for diabetes 6 months ago.  Management since then includes continuing the same treatment.  Home blood sugar records: fasting range: 200 Most Recent Eye Exam: 07/02/2021  --------------------------------------------------------------------------------------------------- Hypertension, follow-up  BP Readings from Last 3 Encounters:  03/14/22 (!) 152/74  02/25/22 132/71  10/02/21 (!) 159/83   Wt Readings from Last 3 Encounters:  03/14/22 199 lb (90.3 kg)  02/25/22 200 lb (90.7 kg)  11/05/21 200 lb (90.7 kg)     He was last seen for hypertension 6 months ago.  Management since that visit includes; taking amlodipine 10 and losartan 100..  Outside blood pressures are not checking.  --------------------------------------------------------------------------------------------------- Lipid/Cholesterol, follow-up  Last Lipid Panel: Lab Results  Component Value Date   CHOL 146 05/07/2021   LDLCALC 45 05/07/2021   HDL 27 (L) 05/07/2021   TRIG 512 (H) 05/07/2021    He was last seen for this 6 months ago.  Management since that visit includes; Watch triglycerides.  Continue  Lovaza and pravastatin.  Last metabolic panel Lab Results  Component Value Date   GLUCOSE 199 (H) 09/05/2021   NA 135 09/05/2021   K 4.3 09/05/2021   BUN 19 09/05/2021   CREATININE 1.02 09/05/2021   EGFR 77 05/07/2021   GFRNONAA >60 09/05/2021   CALCIUM 9.3 09/05/2021   AST 19 05/07/2021   ALT 24 05/07/2021   The 10-year ASCVD risk score (Arnett DK, et al., 2019) is: 43%  ---------------------------------------------------------------------------------------------------    Medications: Outpatient Medications Prior to Visit  Medication Sig   albuterol (PROVENTIL HFA;VENTOLIN HFA) 108 (90 Base) MCG/ACT inhaler Inhale 2 puffs into the lungs every 4 (four) hours as needed for wheezing or shortness of breath.   ALLERGY RELIEF 10 MG tablet TAKE 1 TABLET BY MOUTH  DAILY   amLODipine (NORVASC) 10 MG tablet TAKE 1 TABLET DAILY   aspirin 81 MG tablet Take 81 mg by mouth daily.   blood glucose meter kit and supplies Dispense based on patient and insurance preference. Use up to four times daily as directed. (FOR ICD-10 E10.9, E11.9).   CONTOUR NEXT TEST test strip CHECK BLOOD SUGAR UP TO FOUR TIMES A DAY AS DIRECTED   Dulaglutide (TRULICITY) 1.5 NF/6.2ZH SOPN INJECT THE CONTENTS OF 1 PEN (1.5 MG) UNDER THE SKIN WEEKLY AS DIRECTED   fenofibrate 160 MG tablet Take 160 mg by mouth daily.   fluticasone (FLONASE) 50 MCG/ACT nasal spray Place 2 sprays into both nostrils daily.   Fluticasone Propionate, Inhal, (FLOVENT DISKUS) 50 MCG/ACT AEPB Inhale into the lungs.   glipiZIDE (GLUCOTROL) 10 MG tablet TAKE ONE-HALF (1/2) TABLET DAILY BEFORE  BREAKFAST   losartan (COZAAR) 100 MG tablet TAKE 1 TABLET DAILY   meclizine (ANTIVERT) 25 MG tablet Take 25 mg by mouth 3 (three) times daily as needed for dizziness.   metFORMIN (GLUCOPHAGE) 1000 MG tablet TAKE 1 TABLET TWICE A DAY WITH MEALS   Microlet Lancets MISC USE UP TO 4 TIMES DAILY AS  DIRECTED   omega-3 acid ethyl esters (LOVAZA) 1 g capsule TAKE 1  CAPSULE TWICE A DAY   omeprazole (PRILOSEC) 20 MG capsule TAKE 1 CAPSULE DAILY   pravastatin (PRAVACHOL) 40 MG tablet TAKE 1 TABLET DAILY AT 6 P.M.   sulfacetamide (BLEPH-10) 10 % ophthalmic solution Place 2 drops into both eyes 4 (four) times daily.   No facility-administered medications prior to visit.    Review of Systems  Constitutional:  Negative for appetite change, chills and fever.  Respiratory:  Negative for chest tightness, shortness of breath and wheezing.   Cardiovascular:  Negative for chest pain and palpitations.  Gastrointestinal:  Negative for abdominal pain, nausea and vomiting.        Objective    BP (!) 152/74 (BP Location: Right Arm, Patient Position: Sitting, Cuff Size: Large)   Pulse 62   Resp 16   Wt 199 lb (90.3 kg)   SpO2 97%   BMI 32.12 kg/m  BP Readings from Last 3 Encounters:  03/14/22 (!) 152/74  02/25/22 132/71  10/02/21 (!) 159/83   Wt Readings from Last 3 Encounters:  03/14/22 199 lb (90.3 kg)  02/25/22 200 lb (90.7 kg)  11/05/21 200 lb (90.7 kg)      Physical Exam Vitals reviewed.  Constitutional:      General: He is not in acute distress.    Appearance: He is well-developed.  HENT:     Head: Normocephalic and atraumatic.     Right Ear: Hearing normal.     Left Ear: Hearing normal.     Nose: Nose normal.  Eyes:     General: Lids are normal. No scleral icterus.       Right eye: No discharge.        Left eye: No discharge.     Conjunctiva/sclera: Conjunctivae normal.  Cardiovascular:     Rate and Rhythm: Normal rate and regular rhythm.     Heart sounds: Normal heart sounds.  Pulmonary:     Effort: Pulmonary effort is normal. No respiratory distress.  Skin:    Findings: No lesion or rash.  Neurological:     General: No focal deficit present.     Mental Status: He is alert and oriented to person, place, and time.  Psychiatric:        Mood and Affect: Mood normal.        Speech: Speech normal.        Behavior: Behavior  normal.        Thought Content: Thought content normal.        Judgment: Judgment normal.       Results for orders placed or performed in visit on 03/14/22  POCT glycosylated hemoglobin (Hb A1C)  Result Value Ref Range   Hemoglobin A1C 7.9 (A) 4.0 - 5.6 %   Est. average glucose Bld gHb Est-mCnc 180     Assessment & Plan     1. Type 2 diabetes mellitus with hyperglycemia, without long-term current use of insulin (HCC)  - POCT glycosylated hemoglobin (Hb A1C)  2. Essential hypertension Dyazide to his regimen.  Work on diet and exercise follow-up 3 months -  triamterene-hydrochlorothiazide (DYAZIDE) 37.5-25 MG capsule; Take 1 each (1 capsule total) by mouth every morning.  Dispense: 90 capsule; Refill: 3  3. Hypertriglyceridemia   4. Class 1 obesity with serious comorbidity and body mass index (BMI) of 32.0 to 32.9 in adult, unspecified obesity type    No follow-ups on file.      I, [name], MD, have reviewed all documentation for this visit. The documentation on 03/18/22 for the exam, diagnosis, procedures, and orders are all accurate and complete.    Jakki Doughty Cranford Mon, MD  [facility] [phone] (phone) [phone] (fax)  [place]

## 2022-04-01 ENCOUNTER — Other Ambulatory Visit: Payer: Self-pay | Admitting: Family Medicine

## 2022-04-02 ENCOUNTER — Other Ambulatory Visit (HOSPITAL_COMMUNITY): Payer: Self-pay

## 2022-04-02 MED ORDER — FLOVENT DISKUS 50 MCG/ACT IN AEPB
1.0000 | INHALATION_SPRAY | Freq: Two times a day (BID) | RESPIRATORY_TRACT | 11 refills | Status: DC
  Filled 2022-04-02 – 2022-06-08 (×2): qty 60, 30d supply, fill #0

## 2022-04-02 MED ORDER — ALBUTEROL SULFATE HFA 108 (90 BASE) MCG/ACT IN AERS
2.0000 | INHALATION_SPRAY | RESPIRATORY_TRACT | 11 refills | Status: DC | PRN
  Filled 2022-04-02 – 2022-06-08 (×2): qty 6.7, 16d supply, fill #0

## 2022-04-04 ENCOUNTER — Other Ambulatory Visit (HOSPITAL_COMMUNITY): Payer: Self-pay

## 2022-04-05 ENCOUNTER — Other Ambulatory Visit: Payer: Self-pay | Admitting: Family Medicine

## 2022-04-05 DIAGNOSIS — E785 Hyperlipidemia, unspecified: Secondary | ICD-10-CM

## 2022-04-05 NOTE — Telephone Encounter (Signed)
Requested medication (s) are due for refill today: yes  Requested medication (s) are on the active medication list: yes  Last refill:  10/04/21 #90/1  Future visit scheduled: yes  Notes to clinic:  Unable to refill per protocol due to failed labs, no updated results.      Requested Prescriptions  Pending Prescriptions Disp Refills   losartan (COZAAR) 100 MG tablet [Pharmacy Med Name: LOSARTAN TABS '100MG'$ ] 90 tablet 3    Sig: TAKE 1 TABLET DAILY     Cardiovascular:  Angiotensin Receptor Blockers Failed - 04/05/2022  1:04 PM      Failed - Cr in normal range and within 180 days    Creatinine, Ser  Date Value Ref Range Status  09/05/2021 1.02 0.61 - 1.24 mg/dL Final         Failed - K in normal range and within 180 days    Potassium  Date Value Ref Range Status  09/05/2021 4.3 3.5 - 5.1 mmol/L Final         Failed - Last BP in normal range    BP Readings from Last 1 Encounters:  03/14/22 (!) 152/74         Passed - Patient is not pregnant      Passed - Valid encounter within last 6 months    Recent Outpatient Visits           3 weeks ago Type 2 diabetes mellitus with hyperglycemia, without long-term current use of insulin ([place])   [facility] Jerrol Banana., MD   6 months ago Type 2 diabetes mellitus with hyperglycemia, without long-term current use of insulin [facility])   [facility] Jerrol Banana., MD   6 months ago Carotid stenosis, asymptomatic, left   [facility] Thedore Mins, [place], PA-C   11 months ago Medicare annual wellness visit, subsequent   [facility] Jerrol Banana., MD   1 year ago Type 2 diabetes mellitus with other specified complication, without long-term current use of insulin [facility])   [facility] Jerrol Banana., MD       Future Appointments             In 2 months Jerrol Banana., MD [facility]            Signed  Prescriptions Disp Refills   amLODipine (NORVASC) 10 MG tablet 90 tablet 1    Sig: TAKE 1 TABLET DAILY     Cardiovascular: Calcium Channel Blockers 2 Failed - 04/05/2022  1:04 PM      Failed - Last BP in normal range    BP Readings from Last 1 Encounters:  03/14/22 (!) 152/74         Passed - Last Heart Rate in normal range    Pulse Readings from Last 1 Encounters:  03/14/22 62         Passed - Valid encounter within last 6 months    Recent Outpatient Visits           3 weeks ago Type 2 diabetes mellitus with hyperglycemia, without long-term current use of insulin [facility])   [facility] Jerrol Banana., MD   6 months ago Type 2 diabetes mellitus with hyperglycemia, without long-term current use of insulin [facility])   [facility] Jerrol Banana., MD   6 months ago Carotid stenosis, asymptomatic, left   [facility] Thedore Mins, [place], [place]  11 months ago Medicare annual wellness visit, subsequent   [facility] Jerrol Banana., MD   1 year ago Type 2 diabetes mellitus with other specified complication, without long-term current use of insulin [facility])   [facility] Jerrol Banana., MD       Future Appointments             In 2 months Jerrol Banana., MD [facility]             pravastatin (PRAVACHOL) 40 MG tablet 90 tablet 0    Sig: TAKE 1 TABLET DAILY AT 6 P.M.     Cardiovascular:  Antilipid - Statins Failed - 04/05/2022  1:04 PM      Failed - Lipid Panel in normal range within the last 12 months    Cholesterol, Total  Date Value Ref Range Status  05/07/2021 146 100 - 199 mg/dL Final   LDL Chol Calc (NIH)  Date Value Ref Range Status  05/07/2021 45 0 - 99 mg/dL Final   HDL  Date Value Ref Range Status  05/07/2021 27 (L) >39 mg/dL Final   Triglycerides  Date Value Ref Range Status  05/07/2021 512 (H) 0 - 149 mg/dL Final         Passed -  Patient is not pregnant      Passed - Valid encounter within last 12 months    Recent Outpatient Visits           3 weeks ago Type 2 diabetes mellitus with hyperglycemia, without long-term current use of insulin [facility])   [facility] Jerrol Banana., MD   6 months ago Type 2 diabetes mellitus with hyperglycemia, without long-term current use of insulin [facility])   [facility] Jerrol Ban[facility]., MD   6 months ago Carotid stenosis, asymptomatic, left   [facility] Thedore Mins, [place], PA-C   11 months ago Medicare annual wellness visit, subsequent   [facility]   1 year ago Type 2 diabetes mellitus with other specified complication, without long-term current use of insulin [facility])   [facility] Jerrol Banana., MD       Future Appointments             In 2 months Jerrol Banana., MD [facility]             omeprazole (PRILOSEC) 20 MG capsule 90 capsule 3    Sig: TAKE 1 CAPSULE DAILY     Gastroenterology: Proton Pump Inhibitors Passed - 04/05/2022  1:04 PM      Passed - Valid encounter within last 12 months    Recent Outpatient Visits           3 weeks ago Type 2 diabetes mellitus with hyperglycemia, without long-term current use of insulin [facility])   [facility] Ban[facility] MD   6 months ago Type 2 diabetes mellitus with hyperglycemia, without long-term current use of insulin [facility])   [facility] Jerrol Banana., MD   6 months ago Carotid stenosis, asymptomatic, left   [facility] Thedore Mins, [place], PA-C   11 months ago Medicare annual wellness visit, subsequent   [facility] Jerrol Banana., MD   1 year ago Type 2 diabetes mellitus with other specified complication, without long-term current use of insulin [facility])   [facility]  Jerrol Banana., MD       Future Appointments             In 2 months Jerrol Banana., MD [facility] [place]

## 2022-04-05 NOTE — Telephone Encounter (Signed)
Requested Prescriptions  Pending Prescriptions Disp Refills  . amLODipine (NORVASC) 10 MG tablet [Pharmacy Med Name: AMLODIPINE BESYLATE TABS '10MG'$ ] 90 tablet 3    Sig: TAKE 1 TABLET DAILY     Cardiovascular: Calcium Channel Blockers 2 Failed - 04/05/2022  1:04 PM      Failed - Last BP in normal range    BP Readings from Last 1 Encounters:  03/14/22 (!) 152/74         Passed - Last Heart Rate in normal range    Pulse Readings from Last 1 Encounters:  03/14/22 62         Passed - Valid encounter within last 6 months    Recent Outpatient Visits          3 weeks ago Type 2 diabetes mellitus with hyperglycemia, without long-term current use of insulin ([place])   [facility] Jeffrey Banana., MD   6 months ago Type 2 diabetes mellitus with hyperglycemia, without long-term current use of insulin [facility])   [facility] Jeffrey Banana., MD   6 months ago Carotid stenosis, asymptomatic, left   [facility] Thedore Mins, [place], PA-C   11 months ago Medicare annual wellness visit, subsequent   [facility] Jeffrey Banana., MD   1 year ago Type 2 diabetes mellitus with other specified complication, without long-term current use of insulin [facility])   [facility] Jeffrey Banana., MD      Future Appointments            In 2 months Jeffrey Banana., MD [facility]           . losartan (COZAAR) 100 MG tablet [Pharmacy Med Name: LOSARTAN TABS '100MG'$ ] 90 tablet 3    Sig: TAKE 1 TABLET DAILY     Cardiovascular:  Angiotensin Receptor Blockers Failed - 04/05/2022  1:04 PM      Failed - Cr in normal range and within 180 days    Creatinine, Ser  Date Value Ref Range Status  09/05/2021 1.02 0.61 - 1.24 mg/dL Final         Failed - K in normal range and within 180 days    Potassium  Date Value Ref Range Status  09/05/2021 4.3 3.5 - 5.1 mmol/L Final         Failed - Last BP in  normal range    BP Readings from Last 1 Encounters:  03/14/22 (!) 152/74         Passed - Patient is not pregnant      Passed - Valid encounter within last 6 months    Recent Outpatient Visits          3 weeks ago Type 2 diabetes mellitus with hyperglycemia, without long-term current use of insulin [facility])   [facility] Jeffrey Banana., MD   6 months ago Type 2 diabetes mellitus with hyperglycemia, without long-term current use of insulin [facility])   [facility] Jeffrey Banana., MD   6 months ago Carotid stenosis, asymptomatic, left   [facility] Thedore Mins, [place], PA-C   11 months ago Medicare annual wellness visit, subsequent   [facility] Jeffrey Banana., MD   1 year ago Type 2 diabetes mellitus with other specified complication, without long-term current use of insulin [facility])   [facility] Jeffrey Banana., MD      Future Appointments  In 2 months Jeffrey Banana., MD [facility]           . pravastatin (PRAVACHOL) 40 MG tablet [Pharmacy Med Name: PRAVASTATIN TABS '40MG'$ ] 90 tablet 3    Sig: TAKE 1 TABLET DAILY AT 6 P.M.     Cardiovascular:  Antilipid - Statins Failed - 04/05/2022  1:04 PM      Failed - Lipid Panel in normal range within the last 12 months    Cholesterol, Total  Date Value Ref Range Status  05/07/2021 146 100 - 199 mg/dL Final   LDL Chol Calc (NIH)  Date Value Ref Range Status  05/07/2021 45 0 - 99 mg/dL Final   HDL  Date Value Ref Range Status  05/07/2021 27 (L) >39 mg/dL Final   Triglycerides  Date Value Ref Range Status  05/07/2021 512 (H) 0 - 149 mg/dL Final         Passed - Patient is not pregnant      Passed - Valid encounter within last 12 months    Recent Outpatient Visits          3 weeks ago Type 2 diabetes mellitus with hyperglycemia, without long-term current use of insulin [facility])   [facility] Jeffrey Banana., MD   6 months ago Type 2 diabetes mellitus with hyperglycemia, without long-term current use of insulin [facility])   [facility] Jeffrey Banana., MD   6 months ago Carotid stenosis, asymptomatic, left   [facility] [name], PA-C   11 months ago Medicare annual wellness visit, subsequent   [facility] Jeffrey Banana., MD   1 year ago Type 2 diabetes mellitus with other specified complication, without long-term current use of insulin [facility])   [facility] Jeffrey Banana., MD      Future Appointments            In 2 months Jeffrey Banana., MD [facility]           . omeprazole (PRILOSEC) 20 MG capsule [Pharmacy Med Name: OMEPRAZOLE DR CAPS '20MG'$ ] 90 capsule 3    Sig: TAKE 1 CAPSULE DAILY     Gastroenterology: Proton Pump Inhibitors Passed - 04/05/2022  1:04 PM      Passed - Valid encounter within last 12 months    Recent Outpatient Visits          3 weeks ago Type 2 diabetes mellitus with hyperglycemia, without long-term current use of insulin [facility])   [facility] Banana[facility] MD   6 months ago Type 2 diabetes mellitus with hyperglycemia, without long-term current use of insulin [facility])   [facility] Jeffrey Banana., MD   6 months ago Carotid stenosis, asymptomatic, left   [facility] [name], PA-C   11 months ago Medicare annual wellness visit, subsequent   [facility] Jeffrey Banana., MD   1 year ago Type 2 diabetes mellitus with other specified complication, without long-term current use of insulin [facility])   [facility] Jeffrey Banana., MD      Future Appointments            In 2 months Jeffrey Banana., MD [facility]

## 2022-04-19 ENCOUNTER — Encounter (HOSPITAL_COMMUNITY): Payer: Self-pay | Admitting: Pharmacist

## 2022-04-19 ENCOUNTER — Other Ambulatory Visit (HOSPITAL_COMMUNITY): Payer: Self-pay

## 2022-05-08 ENCOUNTER — Ambulatory Visit (INDEPENDENT_AMBULATORY_CARE_PROVIDER_SITE_OTHER): Payer: Medicare Other

## 2022-05-08 VITALS — BP 150/78 | Ht 67.0 in | Wt 201.5 lb

## 2022-05-08 DIAGNOSIS — Z0000 Encounter for general adult medical examination without abnormal findings: Secondary | ICD-10-CM

## 2022-05-08 DIAGNOSIS — M5414 Radiculopathy, thoracic region: Secondary | ICD-10-CM | POA: Insufficient documentation

## 2022-05-08 DIAGNOSIS — M1610 Unilateral primary osteoarthritis, unspecified hip: Secondary | ICD-10-CM | POA: Insufficient documentation

## 2022-05-08 NOTE — Patient Instructions (Signed)
Jeffrey [name] , Thank you for taking time to come for your Medicare Wellness Visit. I appreciate your ongoing commitment to your health goals. Please review the following plan we discussed and let me know if I can assist you in the future.   Screening recommendations/referrals: Colonoscopy: 02/25/22 Recommended yearly ophthalmology/optometry visit for glaucoma screening and checkup Recommended yearly dental visit for hygiene and checkup  Vaccinations: Influenza vaccine: 05/07/21 Pneumococcal vaccine: 05/07/21 Tdap vaccine: 01/14/19 Shingles vaccine: Zostavax 03/12/16   Shingrix 05/24/21, states had 2nd shot   Covid-19: 09/25/19, 01/13/20, 07/13/20  Advanced directives: no  Conditions/risks identified: none  Next appointment: Follow up in one year for your annual wellness visit. 05/12/23 @ 8:15 am in person  Preventive Care 67 Years and Older, Male Preventive care refers to lifestyle choices and visits with your health care provider that can promote health and wellness. What does preventive care include? A yearly physical exam. This is also called an annual well check. Dental exams once or twice a year. Routine eye exams. Ask your health care provider how often you should have your eyes checked. Personal lifestyle choices, including: Daily care of your teeth and gums. Regular physical activity. Eating a healthy diet. Avoiding tobacco and drug use. Limiting alcohol use. Practicing safe sex. Taking low doses of aspirin every day. Taking vitamin and mineral supplements as recommended by your health care provider. What happens during an annual well check? The services and screenings done by your health care provider during your annual well check will depend on your age, overall health, lifestyle risk factors, and family history of disease. Counseling  Your health care provider may ask you questions about your: Alcohol use. Tobacco use. Drug use. Emotional well-being. Home and relationship  well-being. Sexual activity. Eating habits. History of falls. Memory and ability to understand (cognition). Work and work Statistician. Screening  You may have the following tests or measurements: Height, weight, and BMI. Blood pressure. Lipid and cholesterol levels. These may be checked every 5 years, or more frequently if you are over 67 years old. Skin check. Lung cancer screening. You may have this screening every year starting at age 67 if you have a 30-pack-year history of smoking and currently smoke or have quit within the past 15 years. Fecal occult blood test (FOBT) of the stool. You may have this test every year starting at age 67. Flexible sigmoidoscopy or colonoscopy. You may have a sigmoidoscopy every 5 years or a colonoscopy every 10 years starting at age 67. Prostate cancer screening. Recommendations will vary depending on your family history and other risks. Hepatitis C blood test. Hepatitis B blood test. Sexually transmitted disease (STD) testing. Diabetes screening. This is done by checking your blood sugar (glucose) after you have not eaten for a while (fasting). You may have this done every 1-3 years. Abdominal aortic aneurysm (AAA) screening. You may need this if you are a current or former smoker. Osteoporosis. You may be screened starting at age 67 if you are at high risk. Talk with your health care provider about your test results, treatment options, and if necessary, the need for more tests. Vaccines  Your health care provider may recommend certain vaccines, such as: Influenza vaccine. This is recommended every year. Tetanus, diphtheria, and acellular pertussis (Tdap, Td) vaccine. You may need a Td booster every 10 years. Zoster vaccine. You may need this after age 67. Pneumococcal 13-valent conjugate (PCV13) vaccine. One dose is recommended after age 67. Pneumococcal polysaccharide (PPSV23) vaccine. One dose is  recommended after age 67. Talk to your health care  provider about which screenings and vaccines you need and how often you need them. This information is not intended to replace advice given to you by your health care provider. Make sure you discuss any questions you have with your health care provider. Document Released: 08/25/2015 Document Revised: 04/17/2016 Document Reviewed: 05/30/2015 Elsevier Interactive Patient Education  2017 [place] Prevention in the Home Falls can cause injuries. They can happen to people of all ages. There are many things you can do to make your home safe and to help prevent falls. What can I do on the outside of my home? Regularly fix the edges of walkways and driveways and fix any cracks. Remove anything that might make you trip as you walk through a door, such as a raised step or threshold. Trim any bushes or trees on the path to your home. Use bright outdoor lighting. Clear any walking paths of anything that might make someone trip, such as rocks or tools. Regularly check to see if handrails are loose or broken. Make sure that both sides of any steps have handrails. Any raised decks and porches should have guardrails on the edges. Have any leaves, snow, or ice cleared regularly. Use sand or salt on walking paths during winter. Clean up any spills in your garage right away. This includes oil or grease spills. What can I do in the bathroom? Use night lights. Install grab bars by the toilet and in the tub and shower. Do not use towel bars as grab bars. Use non-skid mats or decals in the tub or shower. If you need to sit down in the shower, use a plastic, non-slip stool. Keep the floor dry. Clean up any water that spills on the floor as soon as it happens. Remove soap buildup in the tub or shower regularly. Attach bath mats securely with double-sided non-slip rug tape. Do not have throw rugs and other things on the floor that can make you trip. What can I do in the bedroom? Use night lights. Make  sure that you have a light by your bed that is easy to reach. Do not use any sheets or blankets that are too big for your bed. They should not hang down onto the floor. Have a firm chair that has side arms. You can use this for support while you get dressed. Do not have throw rugs and other things on the floor that can make you trip. What can I do in the kitchen? Clean up any spills right away. Avoid walking on wet floors. Keep items that you use a lot in easy-to-reach places. If you need to reach something above you, use a strong step stool that has a grab bar. Keep electrical cords out of the way. Do not use floor polish or wax that makes floors slippery. If you must use wax, use non-skid floor wax. Do not have throw rugs and other things on the floor that can make you trip. What can I do with my stairs? Do not leave any items on the stairs. Make sure that there are handrails on both sides of the stairs and use them. Fix handrails that are broken or loose. Make sure that handrails are as long as the stairways. Check any carpeting to make sure that it is firmly attached to the stairs. Fix any carpet that is loose or worn. Avoid having throw rugs at the top or bottom of the stairs. If  you do have throw rugs, attach them to the floor with carpet tape. Make sure that you have a light switch at the top of the stairs and the bottom of the stairs. If you do not have them, ask someone to add them for you. What else can I do to help prevent falls? Wear shoes that: Do not have high heels. Have rubber bottoms. Are comfortable and fit you well. Are closed at the toe. Do not wear sandals. If you use a stepladder: Make sure that it is fully opened. Do not climb a closed stepladder. Make sure that both sides of the stepladder are locked into place. Ask someone to hold it for you, if possible. Clearly mark and make sure that you can see: Any grab bars or handrails. First and last steps. Where the  edge of each step is. Use tools that help you move around (mobility aids) if they are needed. These include: Canes. Walkers. Scooters. Crutches. Turn on the lights when you go into a dark area. Replace any light bulbs as soon as they burn out. Set up your furniture so you have a clear path. Avoid moving your furniture around. If any of your floors are uneven, fix them. If there are any pets around you, be aware of where they are. Review your medicines with your doctor. Some medicines can make you feel dizzy. This can increase your chance of falling. Ask your doctor what other things that you can do to help prevent falls. This information is not intended to replace advice given to you by your health care provider. Make sure you discuss any questions you have with your health care provider. Document Released: 05/25/2009 Document Revised: 01/04/2016 Document Reviewed: 09/02/2014 Elsevier Interactive Patient Education  2017 [employer].
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
## 2022-05-15 ENCOUNTER — Telehealth: Payer: Self-pay | Admitting: General Practice

## 2022-05-15 NOTE — Progress Notes (Signed)
Chronic Care Management Pharmacy Assistant   Name: Jeffrey [name]  MRN: [id number] DOB: 1956-04-14  Reason for Encounter: 2024 Patient Assistance Renewal for Trulicity   Patient currently receives Trulicity through the patient assistance program with [employer]. The program will be accepting all renewal applications for Medicare Patients starting 05/27/2023.   The renewal application for Trulicity will be started, and mailed to the patient's home address to complete. Once the patient's completes the application he will be instructed to return the application to [name], CPP at [facility]. If patient has any questions regarding the application he can contact me directly @ [phone].   Application has been e-mailed to [facility] for mailing to patient's home.  Medications: Outpatient Encounter Medications as of 05/15/2022  Medication Sig   albuterol (VENTOLIN HFA) 108 (90 Base) MCG/ACT inhaler Inhale 2 puffs into the lungs every 4 (four) hours as needed for wheezing or shortness of breath.   ALLERGY RELIEF 10 MG tablet TAKE 1 TABLET BY MOUTH  DAILY   amLODipine (NORVASC) 10 MG tablet TAKE 1 TABLET DAILY   aspirin 81 MG tablet Take 81 mg by mouth daily.   azithromycin (ZITHROMAX) 250 MG tablet TAKE 2 TABLETS FOR ONE DAY, THEN 1 TABLET DAILY THEREAFTER. (Patient not taking: Reported on 05/08/2022)   blood glucose meter kit and supplies Dispense based on patient and insurance preference. Use up to four times daily as directed. (FOR ICD-10 E10.9, E11.9).   Canagliflozin-metFORMIN HCl (INVOKAMET) [id number] MG TABS  (Patient not taking: Reported on 05/08/2022)   CONTOUR NEXT TEST test strip CHECK BLOOD SUGAR UP TO FOUR TIMES A DAY AS DIRECTED   Dulaglutide (TRULICITY) 1.5 LN/9.8XQ SOPN INJECT THE CONTENTS OF 1 PEN (1.5 MG) UNDER THE SKIN WEEKLY AS DIRECTED   empagliflozin (JARDIANCE) 10 MG TABS tablet Take 1 tablet by mouth daily. (Patient not taking: Reported on  05/08/2022)   etodolac (LODINE) 500 MG tablet TAKE 1 TABLET BY MOUTH TWICE A DAY WITH MEALS -DO NOT TAKE IBUPROFEN WHILE TAKING THIS (Patient not taking: Reported on 05/08/2022)   fenofibrate 160 MG tablet Take 160 mg by mouth daily. (Patient not taking: Reported on 05/08/2022)   fluticasone (FLONASE) 50 MCG/ACT nasal spray Place 2 sprays into both nostrils daily.   Fluticasone Propionate, Inhal, (FLOVENT DISKUS) 50 MCG/ACT AEPB Inhale 1 puff into the lungs in the morning and at bedtime.   glipiZIDE (GLUCOTROL) 10 MG tablet TAKE ONE-HALF (1/2) TABLET DAILY BEFORE BREAKFAST   loratadine (CLARITIN) 10 MG tablet Take 1 tablet by mouth daily.   losartan (COZAAR) 100 MG tablet TAKE 1 TABLET DAILY   meclizine (ANTIVERT) 25 MG tablet Take 25 mg by mouth 3 (three) times daily as needed for dizziness.   meloxicam (MOBIC) 15 MG tablet Take 1 tablet every day by oral route as needed. (Patient not taking: Reported on 05/08/2022)   metFORMIN (GLUCOPHAGE) 1000 MG tablet TAKE 1 TABLET TWICE A DAY WITH MEALS   Microlet Lancets MISC USE UP TO 4 TIMES DAILY AS  DIRECTED   naproxen (NAPROSYN) 500 MG tablet Take 1 tablet by mouth 2 (two) times daily. (Patient not taking: Reported on 05/08/2022)   omega-3 acid ethyl esters (LOVAZA) 1 g capsule TAKE 1 CAPSULE TWICE A DAY   omeprazole (PRILOSEC) 20 MG capsule TAKE 1 CAPSULE DAILY   oseltamivir (TAMIFLU) 75 MG capsule  (Patient not taking: Reported on 05/08/2022)   polyethylene glycol-electrolytes (NULYTELY) 420 g solution Take 4,000 mLs by mouth once. (Patient  not taking: Reported on 05/08/2022)   pravastatin (PRAVACHOL) 40 MG tablet TAKE 1 TABLET DAILY AT 6 P.M.   predniSONE (DELTASONE) 10 MG tablet TAKE BY MOUTH AS DIRECTED. TAPER (6,5,4,3,2,1) (Patient not taking: Reported on 05/08/2022)   sulfacetamide (BLEPH-10) 10 % ophthalmic solution Place 2 drops into both eyes 4 (four) times daily. (Patient not taking: Reported on 05/08/2022)   traMADol (ULTRAM) 50 MG tablet Take 1  tablet by mouth every 6 (six) hours as needed. (Patient not taking: Reported on 05/08/2022)   triamterene-hydrochlorothiazide (DYAZIDE) 37.5-25 MG capsule Take 1 each (1 capsule total) by mouth every morning. (Patient not taking: Reported on 05/08/2022)   No facility-administered encounter medications on file as of 05/15/2022.    Lynann Bologna, CPA/CMA Clinical Pharmacist Assistant Phone: [phone]

## 2022-05-31 ENCOUNTER — Other Ambulatory Visit (HOSPITAL_COMMUNITY): Payer: Self-pay

## 2022-06-01 ENCOUNTER — Other Ambulatory Visit: Payer: Self-pay | Admitting: Family Medicine

## 2022-06-01 DIAGNOSIS — E1165 Type 2 diabetes mellitus with hyperglycemia: Secondary | ICD-10-CM

## 2022-06-05 DIAGNOSIS — M19011 Primary osteoarthritis, right shoulder: Secondary | ICD-10-CM | POA: Diagnosis not present

## 2022-06-05 DIAGNOSIS — M25511 Pain in right shoulder: Secondary | ICD-10-CM | POA: Diagnosis not present

## 2022-06-08 ENCOUNTER — Other Ambulatory Visit: Payer: Self-pay | Admitting: Physician Assistant

## 2022-06-08 ENCOUNTER — Other Ambulatory Visit (HOSPITAL_COMMUNITY): Payer: Self-pay

## 2022-06-10 ENCOUNTER — Encounter (HOSPITAL_COMMUNITY): Payer: Self-pay

## 2022-06-10 ENCOUNTER — Other Ambulatory Visit (HOSPITAL_COMMUNITY): Payer: Self-pay

## 2022-06-19 ENCOUNTER — Ambulatory Visit: Payer: Medicare Other | Admitting: Family Medicine

## 2022-06-19 ENCOUNTER — Encounter: Payer: Self-pay | Admitting: Physician Assistant

## 2022-06-19 ENCOUNTER — Ambulatory Visit: Payer: Medicare Other | Admitting: Physician Assistant

## 2022-06-19 VITALS — BP 145/75 | HR 68 | Temp 97.9°F | Resp 16 | Wt 196.8 lb

## 2022-06-19 DIAGNOSIS — E1165 Type 2 diabetes mellitus with hyperglycemia: Secondary | ICD-10-CM

## 2022-06-19 DIAGNOSIS — E1159 Type 2 diabetes mellitus with other circulatory complications: Secondary | ICD-10-CM | POA: Diagnosis not present

## 2022-06-19 DIAGNOSIS — I152 Hypertension secondary to endocrine disorders: Secondary | ICD-10-CM

## 2022-06-19 DIAGNOSIS — E782 Mixed hyperlipidemia: Secondary | ICD-10-CM | POA: Diagnosis not present

## 2022-06-19 DIAGNOSIS — E1169 Type 2 diabetes mellitus with other specified complication: Secondary | ICD-10-CM

## 2022-06-19 DIAGNOSIS — Z23 Encounter for immunization: Secondary | ICD-10-CM | POA: Diagnosis not present

## 2022-06-19 DIAGNOSIS — Z125 Encounter for screening for malignant neoplasm of prostate: Secondary | ICD-10-CM | POA: Diagnosis not present

## 2022-06-19 LAB — POCT GLYCOSYLATED HEMOGLOBIN (HGB A1C)
Est. average glucose Bld gHb Est-mCnc: 180
Hemoglobin A1C: 7.9 % — AB (ref 4.0–5.6)

## 2022-06-19 NOTE — Assessment & Plan Note (Addendum)
A1c previously 7.9% ,today 7.9% Managed with metformin 1000 mg BID glipizide 5 mg and trulicity once a week  Discussed switching trulicty to [employer] if insurance covers, as will help more w/ glycemic control Pt works with [facility] will contact for additional info On statin, arb Foot exam completed today Ordered uacr  Optho utd F/u 3 mo

## 2022-06-19 NOTE — Progress Notes (Signed)
I,Jeffrey [name],acting as a scribe for [employer], PA-C.,have documented all relevant documentation on the behalf of Jeffrey Kirschner, PA-C,as directed by  Jeffrey Kirschner, PA-C while in the presence of Jeffrey Kirschner, PA-C.   Established patient visit   Patient: Jeffrey [name]   DOB: 01/12/1956   66 y.o. Male  MRN: [id number] Visit Date: 06/19/2022  Today's healthcare provider: [name], PA-C   Chief Complaint  Patient presents with   Diabetes   Subjective    HPI  Diabetes Mellitus Type II, Follow-up  Lab Results  Component Value Date   HGBA1C 7.9 (A) 06/19/2022   HGBA1C 7.9 (A) 03/14/2022   HGBA1C 8.5 (H) 10/02/2021   Wt Readings from Last 3 Encounters:  06/19/22 196 lb 12.8 oz (89.3 kg)  05/08/22 201 lb 8 oz (91.4 kg)  03/14/22 199 lb (90.3 kg)   Last seen for diabetes 3 months ago.  Management since then includes advising patient to work on diet and exercise and follow-up 3 months. He reports good compliance with treatment. He is not having side effects.  Symptoms: No fatigue No foot ulcerations  No appetite changes No nausea  No paresthesia of the feet  No polydipsia  No polyuria No visual disturbances   No vomiting     Home blood sugar records: fasting range: 170's  Episodes of hypoglycemia? No    Current insulin regiment: none Most Recent Eye Exam: 07/02/2021 Current exercise: none Current diet habits: in general, an "unhealthy" diet  Pertinent Labs: Lab Results  Component Value Date   CHOL 146 05/07/2021   HDL 27 (L) 05/07/2021   LDLCALC 45 05/07/2021   TRIG 512 (H) 05/07/2021   CHOLHDL 5.4 (H) 05/07/2021   Lab Results  Component Value Date   NA 135 09/05/2021   K 4.3 09/05/2021   CREATININE 1.02 09/05/2021   GFRNONAA >60 09/05/2021   MICROALBUR 50 07/13/2020     ---------------------------------------------------------------------------------------------------   Medications: Outpatient Medications Prior to Visit   Medication Sig   albuterol (VENTOLIN HFA) 108 (90 Base) MCG/ACT inhaler Inhale 2 puffs into the lungs every 4 (four) hours as needed for wheezing or shortness of breath.   ALLERGY RELIEF 10 MG tablet TAKE 1 TABLET BY MOUTH  DAILY   amLODipine (NORVASC) 10 MG tablet TAKE 1 TABLET DAILY   aspirin 81 MG tablet Take 81 mg by mouth daily.   blood glucose meter kit and supplies Dispense based on patient and insurance preference. Use up to four times daily as directed. (FOR ICD-10 E10.9, E11.9).   celecoxib (CELEBREX) 200 MG capsule Take 1 tablet by mouth daily.   CONTOUR NEXT TEST test strip CHECK BLOOD SUGAR UP TO FOUR TIMES A DAY AS DIRECTED   Dulaglutide (TRULICITY) 1.5 VP/7.1GG SOPN INJECT 1.5MG 05ML SUBCUTANEOUSLY ONCE A WEEK   fluticasone (FLONASE) 50 MCG/ACT nasal spray Place 2 sprays into both nostrils daily.   Fluticasone Propionate, Inhal, (FLOVENT DISKUS) 50 MCG/ACT AEPB Inhale 1 puff into the lungs in the morning and at bedtime.   glipiZIDE (GLUCOTROL) 10 MG tablet TAKE ONE-HALF (1/2) TABLET DAILY BEFORE BREAKFAST   loratadine (CLARITIN) 10 MG tablet Take 1 tablet by mouth daily.   losartan (COZAAR) 100 MG tablet TAKE 1 TABLET DAILY   meclizine (ANTIVERT) 25 MG tablet Take 25 mg by mouth 3 (three) times daily as needed for dizziness.   metFORMIN (GLUCOPHAGE) 1000 MG tablet TAKE 1 TABLET TWICE A DAY WITH MEALS   Microlet Lancets MISC USE  UP TO 4 TIMES DAILY AS  DIRECTED   omega-3 acid ethyl esters (LOVAZA) 1 g capsule TAKE 1 CAPSULE TWICE A DAY   omeprazole (PRILOSEC) 20 MG capsule TAKE 1 CAPSULE DAILY   pravastatin (PRAVACHOL) 40 MG tablet TAKE 1 TABLET DAILY AT 6 P.M.   sulfacetamide (BLEPH-10) 10 % ophthalmic solution Place 2 drops into both eyes 4 (four) times daily.   triamterene-hydrochlorothiazide (DYAZIDE) 37.5-25 MG capsule Take 1 each (1 capsule total) by mouth every morning.   [DISCONTINUED] Canagliflozin-metFORMIN HCl (INVOKAMET) [phone] MG TABS    [DISCONTINUED]  empagliflozin (JARDIANCE) 10 MG TABS tablet Take 1 tablet by mouth daily.   [DISCONTINUED] fenofibrate 160 MG tablet Take 160 mg by mouth daily.   [DISCONTINUED] azithromycin (ZITHROMAX) 250 MG tablet TAKE 2 TABLETS FOR ONE DAY, THEN 1 TABLET DAILY THEREAFTER. (Patient not taking: Reported on 05/08/2022)   [DISCONTINUED] etodolac (LODINE) 500 MG tablet TAKE 1 TABLET BY MOUTH TWICE A DAY WITH MEALS -DO NOT TAKE IBUPROFEN WHILE TAKING THIS (Patient not taking: Reported on 05/08/2022)   [DISCONTINUED] meloxicam (MOBIC) 15 MG tablet Take 1 tablet every day by oral route as needed. (Patient not taking: Reported on 05/08/2022)   [DISCONTINUED] naproxen (NAPROSYN) 500 MG tablet Take 1 tablet by mouth 2 (two) times daily. (Patient not taking: Reported on 05/08/2022)   [DISCONTINUED] oseltamivir (TAMIFLU) 75 MG capsule  (Patient not taking: Reported on 05/08/2022)   [DISCONTINUED] polyethylene glycol-electrolytes (NULYTELY) 420 g solution Take 4,000 mLs by mouth once. (Patient not taking: Reported on 05/08/2022)   [DISCONTINUED] predniSONE (DELTASONE) 10 MG tablet TAKE BY MOUTH AS DIRECTED. TAPER (6,5,4,3,2,1) (Patient not taking: Reported on 05/08/2022)   [DISCONTINUED] traMADol (ULTRAM) 50 MG tablet Take 1 tablet by mouth every 6 (six) hours as needed. (Patient not taking: Reported on 05/08/2022)   No facility-administered medications prior to visit.    Review of Systems  Constitutional:  Negative for appetite change, chills and fever.  Respiratory:  Negative for chest tightness, shortness of breath and wheezing.   Cardiovascular:  Negative for chest pain and palpitations.  Gastrointestinal:  Negative for abdominal pain, nausea and vomiting.     Objective    BP (!) 145/75 (BP Location: Left Arm, Patient Position: Sitting, Cuff Size: Normal)   Pulse 68   Temp 97.9 F (36.6 C) (Oral)   Resp 16   Wt 196 lb 12.8 oz (89.3 kg)   SpO2 99% Comment: room air  BMI 30.82 kg/m    Physical Exam Constitutional:       General: He is awake.     Appearance: He is well-developed.  HENT:     Head: Normocephalic.  Eyes:     Conjunctiva/sclera: Conjunctivae normal.  Cardiovascular:     Rate and Rhythm: Normal rate and regular rhythm.     Pulses:          Dorsalis pedis pulses are 3+ on the right side and 3+ on the left side.       Posterior tibial pulses are 2+ on the right side and 2+ on the left side.     Heart sounds: Normal heart sounds.  Pulmonary:     Effort: Pulmonary effort is normal.     Breath sounds: Normal breath sounds.  Feet:     Right foot:     Protective Sensation: 4 sites tested.  4 sites sensed.     Skin integrity: Skin integrity normal.     Toenail Condition: Right toenails are normal.     Left  foot:     Protective Sensation: 4 sites tested.  4 sites sensed.     Skin integrity: Skin integrity normal.     Toenail Condition: Left toenails are normal.  Skin:    General: Skin is warm.  Neurological:     Mental Status: He is alert and oriented to person, place, and time.  Psychiatric:        Attention and Perception: Attention normal.        Mood and Affect: Mood normal.        Speech: Speech normal.        Behavior: Behavior is cooperative.     Results for orders placed or performed in visit on 06/19/22  POCT HgB A1C  Result Value Ref Range   Hemoglobin A1C 7.9 (A) 4.0 - 5.6 %   Est. average glucose Bld gHb Est-mCnc 180     Assessment & Plan     Problem List Items Addressed This Visit       Cardiovascular and Mediastinum   Hypertension associated with diabetes ([place])    Managed with dyazide which was added last visit, has continued losartan 100 mg , amlodipine 10 mg   Still elevated in office. Advised pt to check at home and bring values to next visit.   F/u 3 mo       Relevant Orders   Comprehensive Metabolic Panel (CMET)   AMB Referral to Chronic Care Management Services     Endocrine   Type 2 diabetes mellitus with hyperglycemia, without long-term  current use of insulin (HCC) - Primary    A1c previously 7.9% ,today 7.9% Managed with metformin 1000 mg BID glipizide 5 mg and trulicity once a week  Discussed switching trulicty to [employer] if insurance covers, as will help more w/ glycemic control Pt works with [facility] pharmacy will contact for additional info On statin, arb Foot exam completed today Ordered uacr  Optho utd F/u 3 mo        Relevant Orders   POCT HgB A1C (Completed)   Urine Microalbumin w/creat. ratio   AMB Referral to Chronic Care Management Services   Dyslipidemia with low high density lipoprotein (HDL) cholesterol with hypertriglyceridemia due to type 2 diabetes mellitus (HCC)    On pravastatin 40 mg and lovaza Trigs still elevated previous labs Repeat fasting lipids/cmp      Relevant Orders   Lipid Profile   Comprehensive Metabolic Panel (CMET)   Other Visit Diagnoses     Prostate cancer screening       Relevant Orders   PSA   Need for immunization against influenza       Relevant Orders   Flu Vaccine QUAD High Dose(Fluad) (Completed)         Return in about 3 months (around 09/19/2022) for DMII.      I, Jeffrey Kirschner, PA-C have reviewed all documentation for this visit. The documentation on  06/19/2022   for the exam, diagnosis, procedures, and orders are all accurate and complete.  Jeffrey Kirschner, PA-C [facility] [address] [place], [place], [address] Office: [phone] Fax: [place]

## 2022-06-19 NOTE — Assessment & Plan Note (Addendum)
On pravastatin 40 mg and lovaza Trigs still elevated previous labs Repeat fasting lipids/cmp

## 2022-06-19 NOTE — Assessment & Plan Note (Addendum)
Managed with dyazide which was added last visit, has continued losartan 100 mg , amlodipine 10 mg   Still elevated in office. Advised pt to check at home and bring values to next visit.   F/u 3 mo

## 2022-06-20 ENCOUNTER — Encounter: Payer: Self-pay | Admitting: Physician Assistant

## 2022-06-20 ENCOUNTER — Other Ambulatory Visit: Payer: Self-pay | Admitting: Physician Assistant

## 2022-06-20 ENCOUNTER — Other Ambulatory Visit (HOSPITAL_COMMUNITY): Payer: Self-pay

## 2022-06-20 DIAGNOSIS — J302 Other seasonal allergic rhinitis: Secondary | ICD-10-CM

## 2022-06-20 DIAGNOSIS — E785 Hyperlipidemia, unspecified: Secondary | ICD-10-CM

## 2022-06-21 ENCOUNTER — Telehealth: Payer: Self-pay | Admitting: Physician Assistant

## 2022-06-21 LAB — COMPREHENSIVE METABOLIC PANEL
ALT: 29 IU/L (ref 0–44)
AST: 24 IU/L (ref 0–40)
Albumin/Globulin Ratio: 1.7 (ref 1.2–2.2)
Albumin: 4.3 g/dL (ref 3.9–4.9)
Alkaline Phosphatase: 46 IU/L (ref 44–121)
BUN/Creatinine Ratio: 19 (ref 10–24)
BUN: 31 mg/dL — ABNORMAL HIGH (ref 8–27)
Bilirubin Total: 0.5 mg/dL (ref 0.0–1.2)
CO2: 22 mmol/L (ref 20–29)
Calcium: 9.7 mg/dL (ref 8.6–10.2)
Chloride: 97 mmol/L (ref 96–106)
Creatinine, Ser: 1.62 mg/dL — ABNORMAL HIGH (ref 0.76–1.27)
Globulin, Total: 2.6 g/dL (ref 1.5–4.5)
Glucose: 118 mg/dL — ABNORMAL HIGH (ref 70–99)
Potassium: 4.9 mmol/L (ref 3.5–5.2)
Sodium: 137 mmol/L (ref 134–144)
Total Protein: 6.9 g/dL (ref 6.0–8.5)
eGFR: 46 mL/min/{1.73_m2} — ABNORMAL LOW (ref >59)

## 2022-06-21 LAB — MICROALBUMIN / CREATININE URINE RATIO
Creatinine, Urine: 93.3 mg/dL
Microalb/Creat Ratio: 20 mg/g creat (ref 0–29)
Microalbumin, Urine: 18.4 ug/mL

## 2022-06-21 LAB — LIPID PANEL
Chol/HDL Ratio: 5.6 ratio — ABNORMAL HIGH (ref 0.0–5.0)
Cholesterol, Total: 161 mg/dL (ref 100–199)
HDL: 29 mg/dL — ABNORMAL LOW (ref >39)
LDL Chol Calc (NIH): 46 mg/dL (ref 0–99)
Triglycerides: 596 mg/dL (ref 0–149)
VLDL Cholesterol Cal: 86 mg/dL — ABNORMAL HIGH (ref 5–40)

## 2022-06-21 LAB — PSA: Prostate Specific Ag, Serum: 2.1 ng/mL (ref 0.0–4.0)

## 2022-06-21 NOTE — Telephone Encounter (Signed)
Denied. Medication requested is not in patient's med list. There is no record of medication having been prescribed by our office.

## 2022-06-21 NOTE — Telephone Encounter (Signed)
[facility] faxed refill request for the following medications:  Fluticasone Prop Oint 0.005%    Please advise.

## 2022-06-24 ENCOUNTER — Encounter: Payer: Self-pay | Admitting: Physician Assistant

## 2022-06-24 ENCOUNTER — Other Ambulatory Visit: Payer: Self-pay

## 2022-06-24 DIAGNOSIS — N179 Acute kidney failure, unspecified: Secondary | ICD-10-CM

## 2022-06-24 MED ORDER — ALBUTEROL SULFATE HFA 108 (90 BASE) MCG/ACT IN AERS: 2.0000 | INHALATION_SPRAY | RESPIRATORY_TRACT | 11 refills | Status: AC | PRN

## 2022-06-24 MED ORDER — FLOVENT DISKUS 50 MCG/ACT IN AEPB: 1.0000 | INHALATION_SPRAY | Freq: Two times a day (BID) | RESPIRATORY_TRACT | 11 refills | Status: AC

## 2022-06-24 MED ORDER — FLUTICASONE PROPIONATE 50 MCG/ACT NA SUSP: 2.0000 | Freq: Every day | NASAL | 12 refills | Status: AC

## 2022-06-24 NOTE — Telephone Encounter (Signed)
RX sent

## 2022-06-25 ENCOUNTER — Telehealth: Payer: Self-pay

## 2022-07-01 DIAGNOSIS — N179 Acute kidney failure, unspecified: Secondary | ICD-10-CM | POA: Diagnosis not present

## 2022-07-02 ENCOUNTER — Other Ambulatory Visit: Payer: Self-pay

## 2022-07-02 DIAGNOSIS — E875 Hyperkalemia: Secondary | ICD-10-CM

## 2022-07-02 LAB — BASIC METABOLIC PANEL
BUN/Creatinine Ratio: 18 (ref 10–24)
BUN: 25 mg/dL (ref 8–27)
CO2: 22 mmol/L (ref 20–29)
Calcium: 10.3 mg/dL — ABNORMAL HIGH (ref 8.6–10.2)
Chloride: 97 mmol/L (ref 96–106)
Creatinine, Ser: 1.39 mg/dL — ABNORMAL HIGH (ref 0.76–1.27)
Glucose: 235 mg/dL — ABNORMAL HIGH (ref 70–99)
Potassium: 5.4 mmol/L — ABNORMAL HIGH (ref 3.5–5.2)
Sodium: 137 mmol/L (ref 134–144)
eGFR: 56 mL/min/{1.73_m2} — ABNORMAL LOW (ref >59)

## 2022-07-03 ENCOUNTER — Telehealth: Payer: Medicare Other

## 2022-07-10 ENCOUNTER — Telehealth: Payer: Medicare Other

## 2022-07-11 ENCOUNTER — Telehealth: Payer: Medicare Other

## 2022-07-12 NOTE — Progress Notes (Unsigned)
I,Aryaman Haliburton R Beola Vasallo,acting as a Education administrator for [employer], PA-C.,have documented all relevant documentation on the behalf of Jeffrey Kirschner, PA-C,as directed by  Jeffrey Kirschner, PA-C while in the presence of Jeffrey Kirschner, PA-C.  Virtual Visit via Video Note  I connected with [name] on 07/15/22 at  8:20 AM EST by a video enabled telemedicine application and verified that I am speaking with the correct person using two identifiers.  Location: Patient: home Provider: [facility]   I discussed the limitations of evaluation and management by telemedicine and the availability of in person appointments. The patient expressed understanding and agreed to proceed.   Patient: Jeffrey [name]   DOB: Jan 01, 1956   66 y.o. Male  MRN: [id number] Visit Date: 07/15/2022  Today's healthcare provider: [name], PA-C   Cc. Discussion on new rx   Subjective    HPI Jeffrey [name], age 41, presents for a Mychart video visit to discuss possibly switching to [facility] from trulicity.   Medications: Outpatient Medications Prior to Visit  Medication Sig   albuterol (VENTOLIN HFA) 108 (90 Base) MCG/ACT inhaler Inhale 2 puffs into the lungs every 4 (four) hours as needed for wheezing or shortness of breath.   ALLERGY RELIEF 10 MG tablet TAKE 1 TABLET BY MOUTH  DAILY   amLODipine (NORVASC) 10 MG tablet TAKE 1 TABLET DAILY   aspirin 81 MG tablet Take 81 mg by mouth daily.   blood glucose meter kit and supplies Dispense based on patient and insurance preference. Use up to four times daily as directed. (FOR ICD-10 E10.9, E11.9).   budesonide-formoterol (SYMBICORT) 160-4.5 MCG/ACT inhaler Inhale 2 puffs into the lungs daily.   celecoxib (CELEBREX) 200 MG capsule Take 1 tablet by mouth daily.   CONTOUR NEXT TEST test strip CHECK BLOOD SUGAR UP TO FOUR TIMES A DAY AS DIRECTED   Dulaglutide (TRULICITY) 1.5 PY/1.9JK SOPN INJECT 1.5MG 05ML SUBCUTANEOUSLY ONCE A WEEK   fluticasone (FLONASE) 50  MCG/ACT nasal spray Place 2 sprays into both nostrils daily.   Fluticasone Propionate, Inhal, (FLOVENT DISKUS) 50 MCG/ACT AEPB Inhale 1 puff into the lungs in the morning and at bedtime.   glipiZIDE (GLUCOTROL) 10 MG tablet TAKE ONE-HALF (1/2) TABLET DAILY BEFORE BREAKFAST   loratadine (CLARITIN) 10 MG tablet Take 1 tablet by mouth daily.   losartan (COZAAR) 100 MG tablet TAKE 1 TABLET DAILY   meclizine (ANTIVERT) 25 MG tablet Take 25 mg by mouth 3 (three) times daily as needed for dizziness.   metFORMIN (GLUCOPHAGE) 1000 MG tablet TAKE 1 TABLET TWICE A DAY WITH MEALS   Microlet Lancets MISC USE UP TO 4 TIMES DAILY AS  DIRECTED   omega-3 acid ethyl esters (LOVAZA) 1 g capsule TAKE 1 CAPSULE TWICE A DAY   omeprazole (PRILOSEC) 20 MG capsule TAKE 1 CAPSULE DAILY   omeprazole (PRILOSEC) 20 MG capsule Take 1 capsule by mouth daily.   pravastatin (PRAVACHOL) 40 MG tablet TAKE 1 TABLET DAILY AT 6 P.M.   sulfacetamide (BLEPH-10) 10 % ophthalmic solution Place 2 drops into both eyes 4 (four) times daily.   triamterene-hydrochlorothiazide (DYAZIDE) 37.5-25 MG capsule Take 1 each (1 capsule total) by mouth every morning.   No facility-administered medications prior to visit.    Review of Systems  Constitutional:  Negative for fatigue and fever.  Respiratory:  Negative for cough and shortness of breath.   Cardiovascular:  Negative for chest pain, palpitations and leg swelling.  Neurological:  Negative for dizziness and headaches.  Objective    Blood pressure (!) 162/84, pulse 73, weight 196 lb (88.9 kg).  Home blood pressure value first thing this AM  Physical Exam Constitutional:      Appearance: Normal appearance. He is not ill-appearing.  Neurological:     Mental Status: He is oriented to person, place, and time.  Psychiatric:        Mood and Affect: Mood normal.        Behavior: Behavior normal.     No results found for any visits on 07/15/22.  Assessment & Plan     Problem  List Items Addressed This Visit       Endocrine   Type 2 diabetes mellitus with hyperglycemia, without long-term current use of insulin ([place]) - Primary    I did discuss the differences between mounjaro and trulicity. Advised he is not yet on the max dose of trulicity Advised that I will discuss with our rph if there is any pt assistance w/ mounjaro. Easiest thing for him will likely just be increasing the trulicity dose.        Dyslipidemia with low high density lipoprotein (HDL) cholesterol with hypertriglyceridemia due to type 2 diabetes mellitus ([place])    Still significantly high trigs despite statin and fish oil supplement Will mention to rph as well         No follow-ups on file.      I discussed the assessment and treatment plan with the patient. The patient was provided an opportunity to ask questions and all were answered. The patient agreed with the plan and demonstrated an understanding of the instructions.   The patient was advised to call back or seek an in-person evaluation if the symptoms worsen or if the condition fails to improve as anticipated.  I provided 10 minutes of non-face-to-face time during this encounter.  I, Jeffrey Kirschner, PA-C have reviewed all documentation for this visit. The documentation on  07/15/2022  for the exam, diagnosis, procedures, and orders are all accurate and complete.  Jeffrey Kirschner, PA-C [facility] [address] [place], [place], [address] Office: [phone] Fax: [place]

## 2022-07-15 ENCOUNTER — Encounter: Payer: Self-pay | Admitting: Physician Assistant

## 2022-07-15 ENCOUNTER — Telehealth (INDEPENDENT_AMBULATORY_CARE_PROVIDER_SITE_OTHER): Payer: Medicare Other | Admitting: Physician Assistant

## 2022-07-15 VITALS — BP 162/84 | HR 73 | Wt 196.0 lb

## 2022-07-15 DIAGNOSIS — E1169 Type 2 diabetes mellitus with other specified complication: Secondary | ICD-10-CM

## 2022-07-15 DIAGNOSIS — E782 Mixed hyperlipidemia: Secondary | ICD-10-CM | POA: Diagnosis not present

## 2022-07-15 DIAGNOSIS — Z7985 Long-term (current) use of injectable non-insulin antidiabetic drugs: Secondary | ICD-10-CM

## 2022-07-15 DIAGNOSIS — E1165 Type 2 diabetes mellitus with hyperglycemia: Secondary | ICD-10-CM

## 2022-07-15 NOTE — Assessment & Plan Note (Signed)
I did discuss the differences between mounjaro and trulicity. Advised he is not yet on the max dose of trulicity Advised that I will discuss with our rph if there is any pt assistance w/ mounjaro. Easiest thing for him will likely just be increasing the trulicity dose.

## 2022-07-15 NOTE — Assessment & Plan Note (Signed)
Still significantly high trigs despite statin and fish oil supplement Will mention to rph as well

## 2022-07-18 ENCOUNTER — Other Ambulatory Visit: Payer: Self-pay | Admitting: Physician Assistant

## 2022-07-18 DIAGNOSIS — E1169 Type 2 diabetes mellitus with other specified complication: Secondary | ICD-10-CM

## 2022-07-19 ENCOUNTER — Telehealth: Payer: Self-pay | Admitting: Physician Assistant

## 2022-07-19 ENCOUNTER — Ambulatory Visit (INDEPENDENT_AMBULATORY_CARE_PROVIDER_SITE_OTHER): Payer: Medicare Other

## 2022-07-19 DIAGNOSIS — E1165 Type 2 diabetes mellitus with hyperglycemia: Secondary | ICD-10-CM

## 2022-07-19 DIAGNOSIS — E1169 Type 2 diabetes mellitus with other specified complication: Secondary | ICD-10-CM

## 2022-07-19 DIAGNOSIS — E1159 Type 2 diabetes mellitus with other circulatory complications: Secondary | ICD-10-CM

## 2022-07-19 MED ORDER — METFORMIN HCL 1000 MG PO TABS: 1000.0000 mg | ORAL_TABLET | Freq: Two times a day (BID) | ORAL | 0 refills | Status: DC

## 2022-07-19 NOTE — Telephone Encounter (Signed)
Short term rx has been sent to local pharmacy until mail rx is received. Patient made aware. Verbalized understanding

## 2022-07-19 NOTE — Telephone Encounter (Signed)
Requested Prescriptions  Pending Prescriptions Disp Refills   metFORMIN (GLUCOPHAGE) 1000 MG tablet [Pharmacy Med Name: METFORMIN HCL TABS 1000MG] 180 tablet 0    Sig: TAKE 1 TABLET TWICE A DAY WITH MEALS     Endocrinology:  Diabetes - Biguanides Failed - 07/18/2022  8:38 PM      Failed - Cr in normal range and within 360 days    Creatinine, Ser  Date Value Ref Range Status  07/01/2022 1.39 (H) 0.76 - 1.27 mg/dL Final         Failed - eGFR in normal range and within 360 days    GFR calc Af Amer  Date Value Ref Range Status  02/29/2020 65 >59 mL/min/1.73 Final    Comment:    **[facility] currently reports eGFR in compliance with the current**   recommendations of the [employer]. [facility] will   update reporting as new guidelines are published from the NKF-ASN   Task force.    GFR, Estimated  Date Value Ref Range Status  09/05/2021 >60 >60 mL/min Final    Comment:    (NOTE) Calculated using the CKD-EPI Creatinine Equation (2021)    eGFR  Date Value Ref Range Status  07/01/2022 56 (L) >59 mL/min/1.73 Final         Failed - B12 Level in normal range and within 720 days    No results found for: "VITAMINB12"       Failed - CBC within normal limits and completed in the last 12 months    WBC  Date Value Ref Range Status  09/05/2021 10.1 4.0 - 10.5 K/uL Final   RBC  Date Value Ref Range Status  09/05/2021 5.45 4.22 - 5.81 MIL/uL Final   Hemoglobin  Date Value Ref Range Status  09/05/2021 15.5 13.0 - 17.0 g/dL Final  05/07/2021 15.0 13.0 - 17.7 g/dL Final   HCT  Date Value Ref Range Status  09/05/2021 45.2 39.0 - 52.0 % Final   Hematocrit  Date Value Ref Range Status  05/07/2021 45.3 37.5 - 51.0 % Final   MCHC  Date Value Ref Range Status  09/05/2021 34.3 30.0 - 36.0 g/dL Final   [facility]  Date Value Ref Range Status  09/05/2021 28.4 26.0 - 34.0 pg Final   MCV  Date Value Ref Range Status  09/05/2021 82.9 80.0 - 100.0 fL Final  05/07/2021 84 79 -  97 fL Final   No results found for: "PLTCOUNTKUC", "LABPLAT", "POCPLA" RDW  Date Value Ref Range Status  09/05/2021 13.2 11.5 - 15.5 % Final  05/07/2021 14.1 11.6 - 15.4 % Final         Passed - HBA1C is between 0 and 7.9 and within 180 days    Hemoglobin A1C  Date Value Ref Range Status  06/19/2022 7.9 (A) 4.0 - 5.6 % Final   Hgb A1c MFr Bld  Date Value Ref Range Status  10/02/2021 8.5 (H) 4.8 - 5.6 % Final    Comment:             Prediabetes: 5.7 - 6.4          Diabetes: >6.4          Glycemic control for adults with diabetes: <7.0          Passed - Valid encounter within last 6 months    Recent Outpatient Visits           4 days ago Type 2 diabetes mellitus with hyperglycemia, without long-term current use  of insulin [facility])   [facility] Thedore Mins, [place], PA-C   1 month ago Type 2 diabetes mellitus with hyperglycemia, without long-term current use of insulin [facility])   [facility] Thedore Mins, [place], PA-C   4 months ago Type 2 diabetes mellitus with hyperglycemia, without long-term current use of insulin [facility])   [facility] Jerrol Banana., MD   9 months ago Type 2 diabetes mellitus with hyperglycemia, without long-term current use of insulin [facility])   [facility] Jerrol Banana., MD   10 months ago Carotid stenosis, asymptomatic, left   [facility] [name], [place]

## 2022-07-19 NOTE — Telephone Encounter (Signed)
Pt received a Rx refill for metFORMIN (GLUCOPHAGE) 1000 MG tablet sent to mail order, pt says that he is completely out of his medication and was told by mail order that he will not receive until at least 10 days. Pt would like to know if provider could send in a small supply to his local pharmacy?    [id number] [place]: [phone] Fax: [phone]    Please advise.

## 2022-07-25 NOTE — Chronic Care Management (AMB) (Signed)
Chronic Care Management   CCM RN Visit Note   Name: Jeffrey [name] MRN: [id number] DOB: Dec 27, 1955  Subjective: Jeffrey [name] is a 66 y.o. year old male who is a primary care patient of Jeffrey [name], [place]. The patient was referred to the Chronic Care Management team for assistance with care management needs subsequent to provider initiation of CCM services and plan of care.    Today's Visit:  Engaged with patient by telephone for initial visit.     SDOH Interventions Today    Flowsheet Row Most Recent Value  SDOH Interventions   Food Insecurity Interventions Intervention Not Indicated  Housing Interventions Intervention Not Indicated  Transportation Interventions Intervention Not Indicated  Utilities Interventions Intervention Not Indicated  Financial Strain Interventions Intervention Not Indicated  Social Connections Interventions Intervention Not Indicated         Goals Addressed             This Visit's Progress    Goal: CCM (Diabetes) Expected Outcome: Monitor, Self-Manage and Reduce Symptoms of Diabetes       Current Barriers:  Chronic Disease Management support and education needs related to Diabetes  Planned Interventions: Reviewed provider's plan for diabetes management. Reviewed medications. Discussed importance of medication adherence. Reports taking medications as prescribed. Currently working with the CCM Pharmacist regarding prescription assistance for [employer]. Provided information regarding importance of consistent blood glucose monitoring. Reports monitoring blood glucose levels a few times a day. Reports early morning fasting readings tend to be elevated in the 180s to 200s but decrease to the130s in the late morning. Advised to continue monitoring and record readings. Advised to also monitor nutritional intake the evening prior. Reviewed s/sx of hypoglycemia and hyperglycemia along with recommended interventions.  Discussed nutritional  intake and importance of complying with a diabetic diet. Reports attempting to monitor intake of carbs and added sugars. Advised to read nutrition labels, continue monitoring carbs, increase intake of vegetables and lean proteins and avoid foods with added sugars when possible. Discussed importance of completing recommended DM preventive care. Reports completing required foot care. Due for an eye exam. Eye exam scheduled for 08/28/21. Discussed importance of completing ordered labs as prescribed.  Assessed social determinant of health barriers.    Lab Results  Component Value Date   HGBA1C 7.9 (A) 06/19/2022     Symptom Management: Take medications as prescribed   Attend all scheduled provider appointments Call pharmacy for medication refills 3-7 days in advance of running out of medications Schedule appointment with eye doctor Check blood sugars and record readings Read food labels for fat, fiber, carbohydrates and portion size Check feet daily for cuts, sores or redness Wash and dry feet carefully every day Wear comfortable, cotton socks Wear comfortable, well-fitting shoes Call provider office for new concerns or questions   Follow Up Plan:  Will follow up next month       Goal: CCM (Hypertension) Expected Outcome: Monitor, Self-Manage and Reduce Symptoms of Hypertension       Current Barriers:  Chronic Disease Management support and education needs related to Hypertension  Planned Interventions: Reviewed plan for hypertension management. Reviewed medications and indications for use. Reports tolerating current regimen and taking all medications as prescribed.  Provided information regarding established blood pressure parameters along with indications for notifying a provider. Reports taking at home on occasion. Reports systolic readings have ranged in the 150s. Diastolic readings have ranged in the 80s. Reports occasional palpitations that resolve quickly. Denies chest  discomfort, headaches, dizziness,  or visual changes. Advised to continue monitoring BP and record readings. Advised to notify a provider for elevated readings. Discussed compliance with recommended cardiac prudent diet. Encouraged to read nutrition labels, monitor sodium intake, and avoid highly processed foods when possible. Discussed complications of uncontrolled blood pressure.  Reviewed s/sx of heart attack, stroke and worsening symptoms that require immediate medical attention.   BP Readings from Last 3 Encounters:  07/15/22 (!) 162/84  06/19/22 (!) 145/75  05/08/22 (!) 150/78    Symptom Management: Take medications exactly as prescribed   Attend all scheduled provider appointments Call pharmacy for medication refills 3-7 days in advance of running out of medications Call provider office for new concerns or questions  Check blood pressure daily Eat more whole grains, fruits and vegetables, lean meats and healthy fats Limit salt intake  Report new symptoms to your doctor  Follow Up Plan:  Will follow up next month          PLAN: Will follow up next month    Jeffrey [name] Manager/Chronic Care Management [phone]

## 2022-07-25 NOTE — Patient Instructions (Signed)
Thank you for allowing the Chronic Care Management team to participate in your care.    Following is a copy of your full provider care plan:   Goals Addressed             This Visit's Progress    Goal: CCM (Diabetes) Expected Outcome: Monitor, Self-Manage and Reduce Symptoms of Diabetes       Current Barriers:  Chronic Disease Management support and education needs related to Diabetes  Planned Interventions: Reviewed provider's plan for diabetes management. Reviewed medications. Discussed importance of medication adherence. Reports taking medications as prescribed. Currently working with the CCM Pharmacist regarding prescription assistance for [employer]. Provided information regarding importance of consistent blood glucose monitoring. Reports monitoring blood glucose levels a few times a day. Reports early morning fasting readings tend to be elevated in the 180s to 200s but decrease to the130s in the late morning. Advised to continue monitoring and record readings. Advised to also monitor nutritional intake the evening prior. Reviewed s/sx of hypoglycemia and hyperglycemia along with recommended interventions.  Discussed nutritional intake and importance of complying with a diabetic diet. Reports attempting to monitor intake of carbs and added sugars. Advised to read nutrition labels, continue monitoring carbs, increase intake of vegetables and lean proteins and avoid foods with added sugars when possible. Discussed importance of completing recommended DM preventive care. Reports completing required foot care. Due for an eye exam. Eye exam scheduled for 08/28/21. Discussed importance of completing ordered labs as prescribed.  Assessed social determinant of health barriers.    Lab Results  Component Value Date   HGBA1C 7.9 (A) 06/19/2022     Symptom Management: Take medications as prescribed   Attend all scheduled provider appointments Call pharmacy for medication refills 3-7 days in  advance of running out of medications Schedule appointment with eye doctor Check blood sugars and record readings Read food labels for fat, fiber, carbohydrates and portion size Check feet daily for cuts, sores or redness Wash and dry feet carefully every day Wear comfortable, cotton socks Wear comfortable, well-fitting shoes Call provider office for new concerns or questions   Follow Up Plan:  Will follow up next month       Goal: CCM (Hypertension) Expected Outcome: Monitor, Self-Manage and Reduce Symptoms of Hypertension       Current Barriers:  Chronic Disease Management support and education needs related to Hypertension  Planned Interventions: Reviewed plan for hypertension management. Reviewed medications and indications for use. Reports tolerating current regimen and taking all medications as prescribed.  Provided information regarding established blood pressure parameters along with indications for notifying a provider. Reports taking at home on occasion. Reports systolic readings have ranged in the 150s. Diastolic readings have ranged in the 80s. Reports occasional palpitations that resolve quickly. Denies chest discomfort, headaches, dizziness, or visual changes. Advised to continue monitoring BP and record readings. Advised to notify a provider for elevated readings. Discussed compliance with recommended cardiac prudent diet. Encouraged to read nutrition labels, monitor sodium intake, and avoid highly processed foods when possible. Discussed complications of uncontrolled blood pressure.  Reviewed s/sx of heart attack, stroke and worsening symptoms that require immediate medical attention.   BP Readings from Last 3 Encounters:  07/15/22 (!) 162/84  06/19/22 (!) 145/75  05/08/22 (!) 150/78    Symptom Management: Take medications exactly as prescribed   Attend all scheduled provider appointments Call pharmacy for medication refills 3-7 days in advance of running out of  medications Call provider office for new concerns  or questions  Check blood pressure daily Eat more whole grains, fruits and vegetables, lean meats and healthy fats Limit salt intake  Report new symptoms to your doctor  Follow Up Plan:  Will follow up next month          Mr. Mcmanaman verbalizes understanding of the information, instructions and care plan provided today. Agrees to view in MyChart.    A member of the care management team will follow up next month     [place] Manager/Chronic Care Management [phone]

## 2022-07-25 NOTE — Plan of Care (Signed)
Chronic Care Management Provider Comprehensive Care Plan     Name: Jeffrey [name] MRN: [id number] DOB: 02-May-1956  Referral to Chronic Care Management (CCM) services was placed by Provider:  [name], PA-C on Date: 06/19/22.  Chronic Condition 1: DM Provider Assessment and Plan  Type 2 diabetes mellitus with hyperglycemia, without long-term current use of insulin (HCC) - Primary       A1c previously 7.9% ,today 7.9% Managed with metformin 1000 mg BID glipizide 5 mg and trulicity once a week  Discussed switching trulicty to [employer] if insurance covers, as will help more w/ glycemic control Pt works with ccm pharmacy will contact for additional info On statin, arb Foot exam completed today Ordered uacr  Optho utd F/u 3 mo            Expected Outcome/Goals Addressed This Visit (Provider CCM goals/Provider Assessment and plan  Goal: CCM (Diabetes) Expected Outcome: Monitor, Self-Manage and Reduce Symptoms of Diabetes   Symptom Management Condition 1: Take medications as prescribed   Attend all scheduled provider appointments Call pharmacy for medication refills 3-7 days in advance of running out of medications Schedule appointment with eye doctor Check blood sugars and record readings Read food labels for fat, fiber, carbohydrates and portion size Check feet daily for cuts, sores or redness Wash and dry feet carefully every day Wear comfortable, cotton socks Wear comfortable, well-fitting shoes Call provider office for new concerns or questions     Chronic Condition 2: HTN Provider Assessment and Plan  Hypertension associated with diabetes ([place])       Managed with dyazide which was added last visit, has continued losartan 100 mg , amlodipine 10 mg    Still elevated in office. Advised pt to check at home and bring values to next visit.    F/u 3 mo       Expected Outcome/Goals Addressed This Visit (Provider CCM goals/Provider Assessment and plan  Goal:  CCM (Hypertension) Expected Outcome: Monitor, Self-Manage and Reduce Symptoms of Hypertension     Symptom Management Condition 2: Take medications exactly as prescribed   Attend all scheduled provider appointments Call pharmacy for medication refills 3-7 days in advance of running out of medications Call provider office for new concerns or questions  Check blood pressure daily Eat more whole grains, fruits and vegetables, lean meats and healthy fats Limit salt intake  Report new symptoms to your doctor   Problem List Patient Active Problem List   Diagnosis Date Noted   Primary localized osteoarthritis of pelvic region and thigh 05/08/2022   Thoracic neuritis 05/08/2022   Carotid stenosis, asymptomatic, left 09/18/2021   Impingement syndrome of left shoulder region 06/13/2021   Pancreatitis 07/16/2015   ED (erectile dysfunction) of organic origin 04/18/2015   Hypertension associated with diabetes ([place]) 12/06/2014   Seasonal allergies 12/06/2014   Diverticulosis 12/06/2014   GERD (gastroesophageal reflux disease) 12/06/2014   Type 2 diabetes mellitus with hyperglycemia, without long-term current use of insulin ([place]) 12/06/2014   Plantar fasciitis 12/06/2014   Actinic keratosis 12/06/2014   Peyronie's disease 12/06/2014   Obesity 12/06/2014   Dyslipidemia with low high density lipoprotein (HDL) cholesterol with hypertriglyceridemia due to type 2 diabetes mellitus ([place]) 12/06/2014    Medication Management  Current Outpatient Medications:    albuterol (VENTOLIN HFA) 108 (90 Base) MCG/ACT inhaler, Inhale 2 puffs into the lungs every 4 (four) hours as needed for wheezing or shortness of breath., Disp: 6.7 g, Rfl: 11   ALLERGY RELIEF 10 MG  tablet, TAKE 1 TABLET BY MOUTH  DAILY, Disp: 90 tablet, Rfl: 3   amLODipine (NORVASC) 10 MG tablet, TAKE 1 TABLET DAILY, Disp: 90 tablet, Rfl: 1   aspirin 81 MG tablet, Take 81 mg by mouth daily., Disp: , Rfl:    budesonide-formoterol (SYMBICORT)  160-4.5 MCG/ACT inhaler, Inhale 2 puffs into the lungs daily., Disp: , Rfl:    Dulaglutide (TRULICITY) 1.5 HX/5.0VW SOPN, INJECT 1.5MG 05ML SUBCUTANEOUSLY ONCE A WEEK, Disp: 8 mL, Rfl: 0   fluticasone (FLONASE) 50 MCG/ACT nasal spray, Place 2 sprays into both nostrils daily., Disp: 16 g, Rfl: 12   Fluticasone Propionate, Inhal, (FLOVENT DISKUS) 50 MCG/ACT AEPB, Inhale 1 puff into the lungs in the morning and at bedtime., Disp: 60 each, Rfl: 11   glipiZIDE (GLUCOTROL) 10 MG tablet, TAKE ONE-HALF (1/2) TABLET DAILY BEFORE BREAKFAST, Disp: 135 tablet, Rfl: 1   loratadine (CLARITIN) 10 MG tablet, Take 1 tablet by mouth daily., Disp: , Rfl:    losartan (COZAAR) 100 MG tablet, TAKE 1 TABLET DAILY, Disp: 90 tablet, Rfl: 3   meclizine (ANTIVERT) 25 MG tablet, Take 25 mg by mouth 3 (three) times daily as needed for dizziness., Disp: , Rfl:    metFORMIN (GLUCOPHAGE) 1000 MG tablet, TAKE 1 TABLET TWICE A DAY WITH MEALS, Disp: 180 tablet, Rfl: 0   omega-3 acid ethyl esters (LOVAZA) 1 g capsule, TAKE 1 CAPSULE TWICE A DAY, Disp: 180 capsule, Rfl: 3   omeprazole (PRILOSEC) 20 MG capsule, Take 1 capsule by mouth daily., Disp: , Rfl:    pravastatin (PRAVACHOL) 40 MG tablet, TAKE 1 TABLET DAILY AT 6 P.M., Disp: 90 tablet, Rfl: 3   triamterene-hydrochlorothiazide (DYAZIDE) 37.5-25 MG capsule, Take 1 each (1 capsule total) by mouth every morning., Disp: 90 capsule, Rfl: 3   blood glucose meter kit and supplies, Dispense based on patient and insurance preference. Use up to four times daily as directed. (FOR ICD-10 E10.9, E11.9)., Disp: 1 each, Rfl: 1   celecoxib (CELEBREX) 200 MG capsule, Take 1 tablet by mouth daily. (Patient not taking: Reported on 07/19/2022), Disp: , Rfl:    CONTOUR NEXT TEST test strip, CHECK BLOOD SUGAR UP TO FOUR TIMES A DAY AS DIRECTED, Disp: 400 strip, Rfl: 2   metFORMIN (GLUCOPHAGE) 1000 MG tablet, Take 1 tablet (1,000 mg total) by mouth 2 (two) times daily with a meal., Disp: 28 tablet, Rfl:  0   Microlet Lancets MISC, USE UP TO 4 TIMES DAILY AS  DIRECTED, Disp: 200 each, Rfl: 3   omeprazole (PRILOSEC) 20 MG capsule, TAKE 1 CAPSULE DAILY, Disp: 90 capsule, Rfl: 3   sulfacetamide (BLEPH-10) 10 % ophthalmic solution, Place 2 drops into both eyes 4 (four) times daily., Disp: 15 mL, Rfl: 0  Cognitive Assessment Identity Confirmed: : Name; DOB Cognitive Status: Normal   Functional Assessment Hearing Difficulty or Deaf: no Wear Glasses or Blind: no Concentrating, Remembering or Making Decisions Difficulty (CP): no Difficulty Communicating: no Difficulty Eating/Swallowing: no Walking or Climbing Stairs Difficulty: no Dressing/Bathing Difficulty: no Doing Errands Independently Difficulty (such as shopping) (CP): no Change in Functional Status Since Onset of Current Illness/Injury: no   Caregiver Assessment  Primary Source of Support/Comfort: spouse Name of Support/Comfort Primary Source: [place] in Home: spouse Name(s) of People in Home: [place] if Needed: none   Planned Interventions   DM Reviewed provider's plan for diabetes management. Reviewed medications. Discussed importance of medication adherence. Reports taking medications as prescribed. Currently working with the [facility] Pharmacist  regarding prescription assistance for Trulicity. Provided information regarding importance of consistent blood glucose monitoring. Reports monitoring blood glucose levels a few times a day. Reports early morning fasting readings tend to be elevated in the 180s to 200s but decrease to the130s in the late morning. Advised to continue monitoring and record readings. Advised to also monitor nutritional intake the evening prior. Reviewed s/sx of hypoglycemia and hyperglycemia along with recommended interventions.  Discussed nutritional intake and importance of complying with a diabetic diet. Reports attempting to monitor intake of carbs and added sugars.  Advised to read nutrition labels, continue monitoring carbs, increase intake of vegetables and lean proteins and avoid foods with added sugars when possible. Discussed importance of completing recommended DM preventive care. Reports completing required foot care. Due for an eye exam. Eye exam scheduled for 08/28/21. Discussed importance of completing ordered labs as prescribed.  Assessed social determinant of health barriers.   HTN Reviewed plan for hypertension management.Reviewed medications and indications for use. Reports tolerating current regimen and taking all medications as prescribed.  Provided information regarding established blood pressure parameters along with indications for notifying a provider. Reports taking at home on occasion. Reports systolic readings have ranged in the 150s. Diastolic readings have ranged in the 80s. Reports occasional palpitations that resolve quickly. Denies chest discomfort, headaches, dizziness, or visual changes. Advised to continue monitoring BP and record readings. Advised to notify a provider for elevated readings. Discussed compliance with recommended cardiac prudent diet. Encouraged to read nutrition labels, monitor sodium intake, and avoid highly processed foods when possible. Discussed complications of uncontrolled blood pressure.  Reviewed s/sx of heart attack, stroke and worsening symptoms that require immediate medical attention.   Interaction and coordination with outside resources, practitioners, and providers See CCM Referral  Care Plan: Available in MyChart

## 2022-08-11 DIAGNOSIS — I152 Hypertension secondary to endocrine disorders: Secondary | ICD-10-CM

## 2022-08-11 DIAGNOSIS — E1165 Type 2 diabetes mellitus with hyperglycemia: Secondary | ICD-10-CM

## 2022-08-11 DIAGNOSIS — E1159 Type 2 diabetes mellitus with other circulatory complications: Secondary | ICD-10-CM

## 2022-08-14 NOTE — Telephone Encounter (Signed)
Patient contacted for appointment scheduling only.

## 2022-08-20 ENCOUNTER — Telehealth: Payer: Medicare Other

## 2022-08-28 ENCOUNTER — Telehealth: Payer: Self-pay

## 2022-08-28 ENCOUNTER — Ambulatory Visit: Payer: Medicare Other

## 2022-08-28 DIAGNOSIS — I152 Hypertension secondary to endocrine disorders: Secondary | ICD-10-CM

## 2022-08-28 DIAGNOSIS — E119 Type 2 diabetes mellitus without complications: Secondary | ICD-10-CM | POA: Diagnosis not present

## 2022-08-28 DIAGNOSIS — E1165 Type 2 diabetes mellitus with hyperglycemia: Secondary | ICD-10-CM

## 2022-08-28 LAB — HM DIABETES EYE EXAM

## 2022-08-28 NOTE — Telephone Encounter (Signed)
   CCM RN Visit Note   08/28/22 Name: Cristobal Advani MRN: [id number]      DOB: 1955-08-17  Subjective: Simmie Camerer is a 67 y.o. year old male.     An unsuccessful outreach attempt was made today for a scheduled CCM visit.   Plan: A HIPAA compliant phone message was left for the patient providing contact information and requesting a return call.  Horris Latino RN Care Manager/Chronic Care Management [phone]

## 2022-09-06 DIAGNOSIS — E6609 Other obesity due to excess calories: Secondary | ICD-10-CM | POA: Diagnosis not present

## 2022-09-06 DIAGNOSIS — Z6832 Body mass index (BMI) 32.0-32.9, adult: Secondary | ICD-10-CM | POA: Diagnosis not present

## 2022-09-06 DIAGNOSIS — I10 Essential (primary) hypertension: Secondary | ICD-10-CM | POA: Diagnosis not present

## 2022-09-06 DIAGNOSIS — E119 Type 2 diabetes mellitus without complications: Secondary | ICD-10-CM | POA: Diagnosis not present

## 2022-09-11 NOTE — Chronic Care Management (AMB) (Signed)
Chronic Care Management   CCM RN Visit Note   Name: Jeffrey [name] MRN: [id number] DOB: 1955/12/05  Subjective: Jeffrey [name] is a 67 y.o. year old male who is a primary care patient of [name], [place]. The patient was referred to the Chronic Care Management team for assistance with care management needs subsequent to provider initiation of CCM services and plan of care.    Today's Visit:  Engaged with patient by telephone for  follow up and case closure .        Goals Addressed             This Visit's Progress    COMPLETED: Goal: CCM (Diabetes) Expected Outcome: Monitor, Self-Manage and Reduce Symptoms of Diabetes       Current Barriers:  Chronic Disease Management support and education needs related to Diabetes  Planned Interventions: Reviewed plan for diabetes management. Remains compliant with medications. Reports receiving 4 month supply of Trulicity from [employer]. Reviewed blood glucose readings. Reports monitoring as advised. Reports fasting readings have improved but still elevated in the early morning. Advised to continue monitoring and record readings.  Denies hypoglycemic or hyperglycemic episodes. Reports attempting to monitor intake of carbs and added sugars. Advised to read nutrition labels, continue monitoring carbs, increase intake of vegetables and lean proteins and avoid foods with added sugars when possible. Advised to continue monitoring intake during evening meals to identify trends in glycemic spikes. Reviewed DM preventive exams. Continues completing recommended foot care. Completed required eye exam today. Advised to continue completing labs as advised.   Symptom Management: Take medications as prescribed   Attend all scheduled provider appointments Call pharmacy for medication refills 3-7 days in advance of running out of medications Check blood sugars and record readings Read food labels for fat, fiber, carbohydrates and portion  size Check feet daily for cuts, sores or redness Wash and dry feet carefully every day Wear comfortable, cotton socks Wear comfortable, well-fitting shoes Follow up with Dr. Rosanna Randy as scheduled on 09/06/22   Follow Up Plan Case Closure: No further outreach required Transitioning to [facility]  Will be evaluated by Dr. Rosanna Randy on 09/06/22     COMPLETED: Goal: CCM (Hypertension) Expected Outcome: Monitor, Self-Manage and Reduce Symptoms of Hypertension       Current Barriers:  Chronic Disease Management support and education needs related to Hypertension  Planned Interventions: Reviewed plan for hypertension management. Reviewed medications. Remains compliant with medications. No concerns regarding medication management or tolerance of current regimen. Reviewed blood pressure readings. Continues to monitor as advised. Reviewed parameters and indications for notifying a provider. Denies chest discomfort, headaches, dizziness, or visual changes.  Discussed complications of uncontrolled blood pressure.  Reviewed s/sx of heart attack, stroke and worsening symptoms that require immediate medical attention.     Symptom Management: Take medications exactly as prescribed   Attend all scheduled provider appointments Call pharmacy for medication refills 3-7 days in advance of running out of medications Call provider office for new concerns or questions  Check blood pressure daily Eat more whole grains, fruits and vegetables, lean meats and healthy fats Limit salt intake  Complete outreach with Dr. Rosanna Randy as scheduled on 09/06/22   Case Closure: No further outreach required Transitioning to [facility].  Will be evaluated by Dr. Rosanna Randy on 09/06/22          PLAN: Case Closure: No further outreach required Transitioning to [facility].  Will be evaluated by Dr. Rosanna Randy on 09/06/22  Horris Latino RN Care Manager/Chronic Care  Management [phone]

## 2022-09-11 NOTE — Plan of Care (Signed)
Chronic Care Management Provider Comprehensive Care Plan     Name: Jeffrey [name] MRN: [id number] DOB: May 14, 1956  Referral to Chronic Care Management (CCM) services was placed by Provider:  [name], PA-C on Date: 06/19/22.  Chronic Condition 1: Diabetes Provider Assessment and Plan  Type 2 diabetes mellitus with hyperglycemia, without long-term current use of insulin (HCC) - Primary         A1c previously 7.9% ,today 7.9% Managed with metformin 1000 mg BID glipizide 5 mg and trulicity once a week  Discussed switching trulicty to [employer] if insurance covers, as will help more w/ glycemic control Pt works with [facility] pharmacy will contact for additional info On statin, arb Foot exam completed today Ordered uacr  Optho utd F/u 3 mo      Expected Outcome/Goals Addressed This Visit (Provider CCM goals/Provider Assessment and plan  Goal: CCM (Diabetes) Expected Outcome:  Monitor, Self-Manage And Reduce Symptoms of Diabetes  Symptom Management Condition 1: Take medications as prescribed   Attend all scheduled provider appointments Call pharmacy for medication refills 3-7 days in advance of running out of medications Check blood sugars and record readings Read food labels for fat, fiber, carbohydrates and portion size Check feet daily for cuts, sores or redness Wash and dry feet carefully every day Wear comfortable, cotton socks Wear comfortable, well-fitting shoes Complete outreach with Dr. Rosanna Randy as scheduled on 09/06/22     Chronic Condition 2: Hypertension Provider Assessment and Plan  Hypertension associated with diabetes ([place])         Managed with dyazide which was added last visit, has continued losartan 100 mg , amlodipine 10 mg    Still elevated in office. Advised pt to check at home and bring values to next visit.    F/u 3 mo      Expected Outcome/Goals Addressed This Visit (Provider CCM goals/Provider Assessment and plan  Goal: CCM (Hypertension)  Expected Outcome:  Monitor, Self-Manage And Reduce Symptoms of Hypertension  Symptom Management Condition 2: Take medications exactly as prescribed   Attend all scheduled provider appointments Call pharmacy for medication refills 3-7 days in advance of running out of medications Call provider office for new concerns or questions  Check blood pressure daily Eat more whole grains, fruits and vegetables, lean meats and healthy fats Limit salt intake  Complete outreach with Dr. Rosanna Randy as scheduled on 09/06/22  Problem List Patient Active Problem List   Diagnosis Date Noted   Primary localized osteoarthritis of pelvic region and thigh 05/08/2022   Thoracic neuritis 05/08/2022   Carotid stenosis, asymptomatic, left 09/18/2021   Impingement syndrome of left shoulder region 06/13/2021   Pancreatitis 07/16/2015   ED (erectile dysfunction) of organic origin 04/18/2015   Hypertension associated with diabetes ([place]) 12/06/2014   Seasonal allergies 12/06/2014   Diverticulosis 12/06/2014   GERD (gastroesophageal reflux disease) 12/06/2014   Type 2 diabetes mellitus with hyperglycemia, without long-term current use of insulin ([place]) 12/06/2014   Plantar fasciitis 12/06/2014   Actinic keratosis 12/06/2014   Peyronie's disease 12/06/2014   Obesity 12/06/2014   Dyslipidemia with low high density lipoprotein (HDL) cholesterol with hypertriglyceridemia due to type 2 diabetes mellitus ([place]) 12/06/2014    Medication Management  Current Outpatient Medications:    albuterol (VENTOLIN HFA) 108 (90 Base) MCG/ACT inhaler, Inhale 2 puffs into the lungs every 4 (four) hours as needed for wheezing or shortness of breath., Disp: 6.7 g, Rfl: 11   ALLERGY RELIEF 10 MG tablet, TAKE 1 TABLET BY MOUTH  DAILY, Disp: 90 tablet, Rfl: 3   amLODipine (NORVASC) 10 MG tablet, TAKE 1 TABLET DAILY, Disp: 90 tablet, Rfl: 1   aspirin 81 MG tablet, Take 81 mg by mouth daily., Disp: , Rfl:    blood glucose meter kit and  supplies, Dispense based on patient and insurance preference. Use up to four times daily as directed. (FOR ICD-10 E10.9, E11.9)., Disp: 1 each, Rfl: 1   budesonide-formoterol (SYMBICORT) 160-4.5 MCG/ACT inhaler, Inhale 2 puffs into the lungs daily., Disp: , Rfl:    celecoxib (CELEBREX) 200 MG capsule, Take 1 tablet by mouth daily. (Patient not taking: Reported on 07/19/2022), Disp: , Rfl:    CONTOUR NEXT TEST test strip, CHECK BLOOD SUGAR UP TO FOUR TIMES A DAY AS DIRECTED, Disp: 400 strip, Rfl: 2   Dulaglutide (TRULICITY) 1.5 VQ/0.0QQ SOPN, INJECT 1.'5MG'$  05ML SUBCUTANEOUSLY ONCE A WEEK, Disp: 8 mL, Rfl: 0   fluticasone (FLONASE) 50 MCG/ACT nasal spray, Place 2 sprays into both nostrils daily., Disp: 16 g, Rfl: 12   Fluticasone Propionate, Inhal, (FLOVENT DISKUS) 50 MCG/ACT AEPB, Inhale 1 puff into the lungs in the morning and at bedtime., Disp: 60 each, Rfl: 11   glipiZIDE (GLUCOTROL) 10 MG tablet, TAKE ONE-HALF (1/2) TABLET DAILY BEFORE BREAKFAST, Disp: 135 tablet, Rfl: 1   loratadine (CLARITIN) 10 MG tablet, Take 1 tablet by mouth daily., Disp: , Rfl:    losartan (COZAAR) 100 MG tablet, TAKE 1 TABLET DAILY, Disp: 90 tablet, Rfl: 3   meclizine (ANTIVERT) 25 MG tablet, Take 25 mg by mouth 3 (three) times daily as needed for dizziness., Disp: , Rfl:    metFORMIN (GLUCOPHAGE) 1000 MG tablet, TAKE 1 TABLET TWICE A DAY WITH MEALS, Disp: 180 tablet, Rfl: 0   metFORMIN (GLUCOPHAGE) 1000 MG tablet, Take 1 tablet (1,000 mg total) by mouth 2 (two) times daily with a meal., Disp: 28 tablet, Rfl: 0   Microlet Lancets MISC, USE UP TO 4 TIMES DAILY AS  DIRECTED, Disp: 200 each, Rfl: 3   omega-3 acid ethyl esters (LOVAZA) 1 g capsule, TAKE 1 CAPSULE TWICE A DAY, Disp: 180 capsule, Rfl: 3   omeprazole (PRILOSEC) 20 MG capsule, TAKE 1 CAPSULE DAILY, Disp: 90 capsule, Rfl: 3   omeprazole (PRILOSEC) 20 MG capsule, Take 1 capsule by mouth daily., Disp: , Rfl:    pravastatin (PRAVACHOL) 40 MG tablet, TAKE 1 TABLET DAILY  AT 6 P.M., Disp: 90 tablet, Rfl: 3   sulfacetamide (BLEPH-10) 10 % ophthalmic solution, Place 2 drops into both eyes 4 (four) times daily., Disp: 15 mL, Rfl: 0   triamterene-hydrochlorothiazide (DYAZIDE) 37.5-25 MG capsule, Take 1 each (1 capsule total) by mouth every morning., Disp: 90 capsule, Rfl: 3  Cognitive Assessment Identity Confirmed: : Name; DOB Cognitive Status: Normal   Functional Assessment Hearing Difficulty or Deaf: no Wear Glasses or Blind: no Concentrating, Remembering or Making Decisions Difficulty (CP): no Difficulty Communicating: no Difficulty Eating/Swallowing: no Walking or Climbing Stairs Difficulty: no Dressing/Bathing Difficulty: no Doing Errands Independently Difficulty (such as shopping) (CP): no Change in Functional Status Since Onset of Current Illness/Injury: no   Caregiver Assessment  Primary Source of Support/Comfort: spouse Name of Support/Comfort Primary Source: [place] in Home: spouse Name(s) of People in Home: [place] if Needed: none   Planned Interventions  Diabetes Reviewed plan for diabetes management. Remains compliant with medications. Reports receiving 4 month supply of Trulicity from [employer]. Reviewed blood glucose readings. Reports monitoring as advised. Reports fasting readings have improved  but still elevated in the early morning. Advised to continue monitoring and record readings.  Denies hypoglycemic or hyperglycemic episodes. Reports attempting to monitor intake of carbs and added sugars. Advised to read nutrition labels, continue monitoring carbs, increase intake of vegetables and lean proteins and avoid foods with added sugars when possible. Advised to continue monitoring intake during evening meals to identify trends in glycemic spikes. Reviewed DM preventive exams. Continues completing recommended foot care. Completed required eye exam today. Advised to continue completing labs as  advised. Transitioning to [facility]. Advised to complete outreach with Dr. Rosanna Randy as scheduled on 09/06/22.   Hypertension Reviewed plan for hypertension management. Reviewed medications. Remains compliant with medications. No concerns regarding medication management or tolerance of current regimen. Reviewed blood pressure readings. Continues to monitor as advised. Reviewed parameters and indications for notifying a provider. Denies chest discomfort, headaches, dizziness, or visual changes.  Discussed complications of uncontrolled blood pressure.  Reviewed s/sx of heart attack, stroke and worsening symptoms that require immediate medical attention. Transitioning to [facility]. Advised to complete outreach with Dr. Rosanna Randy as scheduled on 09/06/22.     Interaction and coordination with outside resources, practitioners, and providers See CCM Referral  Care Plan: Available in MyChart

## 2022-09-19 DIAGNOSIS — H2513 Age-related nuclear cataract, bilateral: Secondary | ICD-10-CM | POA: Diagnosis not present

## 2022-10-08 DIAGNOSIS — H52221 Regular astigmatism, right eye: Secondary | ICD-10-CM | POA: Diagnosis not present

## 2022-10-08 DIAGNOSIS — H2512 Age-related nuclear cataract, left eye: Secondary | ICD-10-CM | POA: Diagnosis not present

## 2022-10-08 DIAGNOSIS — H2511 Age-related nuclear cataract, right eye: Secondary | ICD-10-CM | POA: Diagnosis not present

## 2022-10-17 ENCOUNTER — Other Ambulatory Visit: Payer: Self-pay

## 2022-10-17 ENCOUNTER — Encounter: Payer: Self-pay | Admitting: Ophthalmology

## 2022-10-23 NOTE — Discharge Instructions (Signed)

## 2022-10-28 ENCOUNTER — Ambulatory Visit: Payer: Medicare Other | Admitting: Anesthesiology

## 2022-10-28 ENCOUNTER — Ambulatory Visit
Admission: RE | Admit: 2022-10-28 | Discharge: 2022-10-28 | Disposition: A | Discharge status: Home / Self Care | Payer: Medicare Other | Attending: Ophthalmology | Admitting: Ophthalmology

## 2022-10-28 ENCOUNTER — Encounter
Admission: RE | Disposition: A | Discharge status: Home / Self Care | Payer: Self-pay | Source: Home / Self Care | Attending: Ophthalmology

## 2022-10-28 ENCOUNTER — Other Ambulatory Visit: Payer: Self-pay

## 2022-10-28 DIAGNOSIS — Z09 Encounter for follow-up examination after completed treatment for conditions other than malignant neoplasm: Secondary | ICD-10-CM | POA: Insufficient documentation

## 2022-10-28 DIAGNOSIS — E669 Obesity, unspecified: Secondary | ICD-10-CM | POA: Insufficient documentation

## 2022-10-28 DIAGNOSIS — Z87891 Personal history of nicotine dependence: Secondary | ICD-10-CM | POA: Diagnosis not present

## 2022-10-28 DIAGNOSIS — Z7984 Long term (current) use of oral hypoglycemic drugs: Secondary | ICD-10-CM | POA: Insufficient documentation

## 2022-10-28 DIAGNOSIS — I10 Essential (primary) hypertension: Secondary | ICD-10-CM | POA: Insufficient documentation

## 2022-10-28 DIAGNOSIS — J45909 Unspecified asthma, uncomplicated: Secondary | ICD-10-CM | POA: Insufficient documentation

## 2022-10-28 DIAGNOSIS — E1165 Type 2 diabetes mellitus with hyperglycemia: Secondary | ICD-10-CM

## 2022-10-28 DIAGNOSIS — Z6833 Body mass index (BMI) 33.0-33.9, adult: Secondary | ICD-10-CM | POA: Insufficient documentation

## 2022-10-28 DIAGNOSIS — E1136 Type 2 diabetes mellitus with diabetic cataract: Secondary | ICD-10-CM | POA: Insufficient documentation

## 2022-10-28 DIAGNOSIS — H2512 Age-related nuclear cataract, left eye: Secondary | ICD-10-CM | POA: Insufficient documentation

## 2022-10-28 DIAGNOSIS — K219 Gastro-esophageal reflux disease without esophagitis: Secondary | ICD-10-CM | POA: Insufficient documentation

## 2022-10-28 DIAGNOSIS — E785 Hyperlipidemia, unspecified: Secondary | ICD-10-CM | POA: Insufficient documentation

## 2022-10-28 HISTORY — DX: Dizziness and giddiness: R42

## 2022-10-28 HISTORY — DX: Unspecified osteoarthritis, unspecified site: M19.90

## 2022-10-28 HISTORY — DX: Family history of other specified conditions: Z84.89

## 2022-10-28 HISTORY — PX: CATARACT EXTRACTION W/PHACO: SHX586

## 2022-10-28 LAB — GLUCOSE, CAPILLARY: Glucose-Capillary: 285 mg/dL — ABNORMAL HIGH (ref 70–99)

## 2022-10-28 SURGERY — PHACOEMULSIFICATION, CATARACT, WITH IOL INSERTION
Anesthesia: Monitor Anesthesia Care | Site: Eye | Laterality: Left

## 2022-10-28 MED ORDER — MOXIFLOXACIN HCL 0.5 % OP SOLN
OPHTHALMIC | Status: DC | PRN
  Administered 2022-10-28: .2 mL via OPHTHALMIC

## 2022-10-28 MED ORDER — SIGHTPATH DOSE#1 BSS IO SOLN
INTRAOCULAR | Status: DC | PRN
  Administered 2022-10-28: 15 mL

## 2022-10-28 MED ORDER — FENTANYL CITRATE (PF) 100 MCG/2ML IJ SOLN
INTRAMUSCULAR | Status: DC | PRN
  Administered 2022-10-28 (×2): 50 ug via INTRAVENOUS

## 2022-10-28 MED ORDER — MIDAZOLAM HCL 2 MG/2ML IJ SOLN
INTRAMUSCULAR | Status: DC | PRN
  Administered 2022-10-28: 2 mg via INTRAVENOUS

## 2022-10-28 MED ORDER — LIDOCAINE HCL (PF) 2 % IJ SOLN
INTRAOCULAR | Status: DC | PRN
  Administered 2022-10-28: 1 mL via INTRAOCULAR

## 2022-10-28 MED ORDER — SIGHTPATH DOSE#1 NA HYALUR & NA CHOND-NA HYALUR IO KIT
PACK | INTRAOCULAR | Status: DC | PRN
  Administered 2022-10-28: 1 via OPHTHALMIC

## 2022-10-28 MED ORDER — SIGHTPATH DOSE#1 BSS IO SOLN
INTRAOCULAR | Status: DC | PRN
  Administered 2022-10-28: 82 mL via OPHTHALMIC

## 2022-10-28 MED ORDER — ARMC OPHTHALMIC DILATING DROPS
1.0000 | OPHTHALMIC | Status: DC | PRN
  Administered 2022-10-28 (×3): 1 via OPHTHALMIC

## 2022-10-28 MED ORDER — INSULIN LISPRO 100 UNIT/ML IJ SOLN
5.0000 [IU] | Freq: Once | INTRAMUSCULAR | Status: AC
  Administered 2022-10-28: 5 [IU] via SUBCUTANEOUS

## 2022-10-28 MED ORDER — TETRACAINE HCL 0.5 % OP SOLN
1.0000 [drp] | OPHTHALMIC | Status: DC | PRN
  Administered 2022-10-28 (×3): 1 [drp] via OPHTHALMIC

## 2022-10-28 SURGICAL SUPPLY — 13 items
CATARACT SUITE SIGHTPATH (MISCELLANEOUS) ×1 IMPLANT
DISSECTOR HYDRO NUCLEUS 50X22 (MISCELLANEOUS) ×1 IMPLANT
FEE CATARACT SUITE SIGHTPATH (MISCELLANEOUS) ×1 IMPLANT
GLOVE SURG GAMMEX PI TX LF 7.5 (GLOVE) ×1 IMPLANT
GLOVE SURG SYN 8.5  E (GLOVE) ×1
GLOVE SURG SYN 8.5 E (GLOVE) ×1 IMPLANT
GLOVE SURG SYN 8.5 PF PI (GLOVE) ×1 IMPLANT
LENS IOL TECNIS EYHANCE 20.5 (Intraocular Lens) IMPLANT
NDL FILTER BLUNT 18X1 1/2 (NEEDLE) ×1 IMPLANT
NEEDLE FILTER BLUNT 18X1 1/2 (NEEDLE) ×1 IMPLANT
SYR 3ML LL SCALE MARK (SYRINGE) ×1 IMPLANT
SYR 5ML LL (SYRINGE) ×1 IMPLANT
WATER STERILE IRR 250ML POUR (IV SOLUTION) ×1 IMPLANT

## 2022-10-28 NOTE — Anesthesia Preprocedure Evaluation (Addendum)
Anesthesia Evaluation  Patient identified by MRN, date of birth, ID band Patient awake    Reviewed: Allergy & Precautions, NPO status , Patient's Chart, lab work & pertinent test results  Airway Mallampati: III  TM Distance: >3 FB Neck ROM: full    Dental  (+) Teeth Intact   Pulmonary asthma , former smoker   Pulmonary exam normal        Cardiovascular hypertension, Normal cardiovascular exam     Neuro/Psych negative neurological ROS  negative psych ROS   GI/Hepatic negative GI ROS, Neg liver ROS,,,  Endo/Other  diabetes, Poorly Controlled, Type 2    Renal/GU negative Renal ROS  negative genitourinary   Musculoskeletal   Abdominal  (+) + obese  Peds  Hematology negative hematology ROS (+)   Anesthesia Other Findings Past Medical History: No date: Actinic keratosis No date: Cholecystitis No date: Diabetes mellitus (HCC) No date: Diverticulosis No date: Erectile dysfunction No date: GERD (gastroesophageal reflux disease) No date: History of kidney stones No date: Hyperlipidemia No date: Hypertension No date: Myalgia No date: Obesity No date: Peyronie's disease No date: Plantar fasciitis No date: Seasonal allergies  Past Surgical History: No date: ARTHROSCOPIC REPAIR ACL No date: HERNIA REPAIR     Comment:  x 2 No date: LITHOTRIPSY No date: MENISCUS REPAIR  BMI    Body Mass Index: 32.28 kg/m      Reproductive/Obstetrics negative OB ROS                             Anesthesia Physical Anesthesia Plan  ASA: 3  Anesthesia Plan: MAC   Post-op Pain Management: Minimal or no pain anticipated   Induction: Intravenous  PONV Risk Score and Plan:   Airway Management Planned: Nasal Cannula  Additional Equipment: None  Intra-op Plan:   Post-operative Plan:   Informed Consent: I have reviewed the patients History and Physical, chart, labs and discussed the procedure  including the risks, benefits and alternatives for the proposed anesthesia with the patient or authorized representative who has indicated his/her understanding and acceptance.       Plan Discussed with: CRNA  Anesthesia Plan Comments:         Anesthesia Quick Evaluation

## 2022-10-28 NOTE — H&P (Signed)
[facility]   Primary Care Physician:  Jeffrey Post, MD Ophthalmologist: Dr. Benay [name]  Pre-Procedure History & Physical: HPI:  Jeffrey [name] is a 67 y.o. male here for cataract surgery.   Past Medical History:  Diagnosis Date   Actinic keratosis    Arthritis    shoulder   Cholecystitis    Diabetes mellitus ([place])    Type 2   Diverticulosis    Erectile dysfunction    Family history of adverse reaction to anesthesia    mother heart stopped during cardiac cath   GERD (gastroesophageal reflux disease)    History of kidney stones    Hyperlipidemia    Hypertension    Myalgia    Obesity    Peyronie's disease    Plantar fasciitis    Seasonal allergies    Vertigo    2 years ago    Past Surgical History:  Procedure Laterality Date   ARTHROSCOPIC REPAIR ACL     COLONOSCOPY N/A 02/25/2022   Procedure: COLONOSCOPY;  Surgeon: Jeffrey Helling, DO;  Location: [facility] ENDOSCOPY;  Service: Gastroenterology;  Laterality: N/A;  DM   HERNIA REPAIR     x 2   LITHOTRIPSY     MENISCUS REPAIR      Prior to Admission medications   Medication Sig Start Date End Date Taking? Authorizing Provider  albuterol (VENTOLIN HFA) 108 (90 Base) MCG/ACT inhaler Inhale 2 puffs into the lungs every 4 (four) hours as needed for wheezing or shortness of breath. 06/24/22  Yes Jeffrey Kirschner, PA-C  ALLERGY RELIEF 10 MG tablet TAKE 1 TABLET BY MOUTH  DAILY 08/31/19  Yes Jeffrey Post, MD  amLODipine (NORVASC) 10 MG tablet TAKE 1 TABLET DAILY 04/05/22  Yes Jeffrey Post, MD  aspirin 81 MG tablet Take 81 mg by mouth daily.   Yes [provider]  blood glucose meter kit and supplies Dispense based on patient and insurance preference. Use up to four times daily as directed. (FOR ICD-10 E10.9, E11.9). 07/16/19  Yes Jeffrey Post, MD  budesonide-formoterol [facility]) 160-4.5 MCG/ACT inhaler Inhale 2 puffs into the lungs daily.   Yes [provider]  celecoxib  (CELEBREX) 200 MG capsule Take 1 tablet by mouth daily.   Yes [provider]  CONTOUR NEXT TEST test strip CHECK BLOOD SUGAR UP TO FOUR TIMES A DAY AS DIRECTED 02/26/21  Yes Jeffrey Post, MD  Dulaglutide (TRULICITY) 1.5 0000000 SOPN INJECT 1.5MG  05ML SUBCUTANEOUSLY ONCE A WEEK Patient taking differently: INJECT 1.5MG  05ML SUBCUTANEOUSLY ONCE A WEEK, SUNDAYS 06/03/22  Yes [name], Jeffrey Comment, PA-C  fluticasone (FLONASE) 50 MCG/ACT nasal spray Place 2 sprays into both nostrils daily. 06/24/22  Yes [name], Jeffrey Comment, PA-C  Fluticasone Propionate, Inhal, (FLOVENT DISKUS) 50 MCG/ACT AEPB Inhale 1 puff into the lungs in the morning and at bedtime. 06/24/22  Yes [name], Jeffrey Comment, PA-C  loratadine (CLARITIN) 10 MG tablet Take 1 tablet by mouth daily.   Yes [provider]  losartan (COZAAR) 100 MG tablet TAKE 1 TABLET DAILY 04/05/22  Yes Jeffrey Post, MD  meclizine (ANTIVERT) 25 MG tablet Take 25 mg by mouth 3 (three) times daily as needed for dizziness.   Yes [provider]  metFORMIN (GLUCOPHAGE) 1000 MG tablet TAKE 1 TABLET TWICE A DAY WITH MEALS 07/19/22  Yes [name], Jeffrey Comment, PA-C  metFORMIN (GLUCOPHAGE) 1000 MG tablet Take 1 tablet (1,000 mg total) by mouth 2 (two) times daily with a meal. 07/19/22  Yes Jeffrey Kirschner, PA-C  Microlet Lancets MISC USE UP TO 4 TIMES DAILY AS  DIRECTED 10/22/21  Yes Jeffrey Post, MD  omega-3 acid ethyl esters (LOVAZA) 1 g capsule TAKE 1 CAPSULE TWICE A DAY 01/02/22  Yes Jeffrey Post, MD  omeprazole (PRILOSEC) 20 MG capsule Take 1 capsule by mouth daily.   Yes [provider]  pravastatin (PRAVACHOL) 40 MG tablet TAKE 1 TABLET DAILY AT 6 P.M. 06/20/22  Yes [name], Jeffrey Comment, PA-C  sulfacetamide (BLEPH-10) 10 % ophthalmic solution Place 2 drops into both eyes 4 (four) times daily. 08/23/17  Yes Jeffrey Ginsberg, PA  triamterene-hydrochlorothiazide (DYAZIDE) 37.5-25 MG capsule Take 1 each (1 capsule total) by mouth every morning.  03/14/22  Yes Jeffrey Post, MD  glipiZIDE (GLUCOTROL) 10 MG tablet TAKE ONE-HALF (1/2) TABLET DAILY BEFORE BREAKFAST 10/04/21   Jeffrey Post, MD  omeprazole (PRILOSEC) 20 MG capsule TAKE 1 CAPSULE DAILY 04/05/22   Jeffrey Post, MD    Allergies as of 09/23/2022 - Review Complete 07/15/2022  Allergen Reaction Noted   Codeine Hives 12/06/2014    Family History  Problem Relation Age of Onset   Hypertension Father    Heart attack Father    ALS Father    Drug abuse Sister        Secondary to head injury after MVA   Club foot Son        Bilat   Hypertension Brother    Heart disease Brother    Hypertension Brother    Heart disease Brother    Hypertension Brother    Cancer Maternal Grandmother    Diabetes Maternal Grandfather    Cancer Paternal Grandmother    Hypertension Paternal Grandfather    Heart attack Paternal Grandfather    Diabetes Paternal Grandfather    Diabetes Mother        diet controlled    Social History   Socioeconomic History   Marital status: Married    Spouse name: Jeffrey [name]   Number of children: 3   Years of education: bachelors   Highest education level: Not on file  Occupational History    Employer: [employer]  Tobacco Use   Smoking status: Former    Packs/day: 1.5    Types: Cigarettes    Quit date: 08/11/1984    Years since quitting: 38.2   Smokeless tobacco: Never  Vaping Use   Vaping Use: Never used  Substance and Sexual Activity   Alcohol use: No    Alcohol/week: 0.0 standard drinks of alcohol   Drug use: No   Sexual activity: Not on file  Other Topics Concern   Not on file  Social History Narrative   Jeffrey [name] has 3 biological children, and 2 adopted children.   Social Determinants of Health   Financial Resource Strain: Low Risk  (07/19/2022)   Overall Financial Resource Strain (CARDIA)    Difficulty of Paying Living Expenses: Not hard at all  Food Insecurity: No Food Insecurity (07/19/2022)    Hunger Vital Sign    Worried About Running Out of Food in the Last Year: Never true    Ran Out of Food in the Last Year: Never true  Transportation Needs: No Transportation Needs (07/19/2022)   PRAPARE - Hydrologist (Medical): No    Lack of Transportation (Non-Medical): No  Physical Activity: Insufficiently Active (05/08/2022)   Exercise Vital Sign    Days of Exercise per Week: 2 days    Minutes of Exercise per Session:  20 min  Stress: No Stress Concern Present (05/08/2022)   [place]    Feeling of Stress : Only a little  Social Connections: Moderately Integrated (07/19/2022)   Social Connection and Isolation Panel [NHANES]    Frequency of Communication with Friends and Family: More than three times a week    Frequency of Social Gatherings with Friends and Family: More than three times a week    Attends Religious Services: More than 4 times per year    Active Member of [employer] or Organizations: No    Attends Archivist Meetings: Never    Marital Status: Married  Human resources officer Violence: Not At Risk (05/08/2022)   Humiliation, Afraid, Rape, and Kick questionnaire    Fear of Current or Ex-Partner: No    Emotionally Abused: No    Physically Abused: No    Sexually Abused: No    Review of Systems: See HPI, otherwise negative ROS  Physical Exam: BP (!) 144/68   Pulse 69   Temp 97.9 F (36.6 C) (Tympanic)   Resp 12   Ht 5\' 5"  (1.651 m)   Wt 90.6 kg   SpO2 97%   BMI 33.25 kg/m  General:   Alert, cooperative in NAD Head:  Normocephalic and atraumatic. Respiratory:  Normal work of breathing. Cardiovascular:  RRR  Impression/Plan: Jeffrey [name] is here for cataract surgery.  Risks, benefits, limitations, and alternatives regarding cataract surgery have been reviewed with the patient.  Questions have been answered.  All parties agreeable.   Jeffrey Pillow, MD   10/28/2022, 10:09 AM

## 2022-10-28 NOTE — Op Note (Signed)
OPERATIVE NOTE  Ayaz Belmore [id number] 10/28/2022   PREOPERATIVE DIAGNOSIS:  Nuclear sclerotic cataract left eye.  H25.12   POSTOPERATIVE DIAGNOSIS:    Nuclear sclerotic cataract left eye.     PROCEDURE:  Phacoemusification with posterior chamber intraocular lens placement of the left eye   LENS:   Implant Name Type Inv. Item Serial No. Manufacturer Lot No. LRB No. Used Action  LENS IOL TECNIS EYHANCE 20.5 - [id number] Intraocular Lens LENS IOL TECNIS EYHANCE 20.5 [id number] [employer]  Left 1 Implanted      Procedure(s): CATARACT EXTRACTION PHACO AND INTRAOCULAR LENS PLACEMENT (IOC) LEFT DIABETIC  1.89  00:19.5 (Left)  DIB00 +20.5   ULTRASOUND TIME: 0 minutes 19 seconds.  CDE 1.89   SURGEON:  Benay Pillow, MD, MPH   ANESTHESIA:  Topical with tetracaine drops augmented with 1% preservative-free intracameral lidocaine.  ESTIMATED BLOOD LOSS: <1 mL   COMPLICATIONS:  None.   DESCRIPTION OF PROCEDURE:  The patient was identified in the holding room and transported to the operating room and placed in the supine position under the operating microscope.  The left eye was identified as the operative eye and it was prepped and draped in the usual sterile ophthalmic fashion.   A 1.0 millimeter clear-corneal paracentesis was made at the 5:00 position. 0.5 ml of preservative-free 1% lidocaine with epinephrine was injected into the anterior chamber.  The anterior chamber was filled with viscoelastic.  A 2.4 millimeter keratome was used to make a near-clear corneal incision at the 2:00 position.  A curvilinear capsulorrhexis was made with a cystotome and capsulorrhexis forceps.  Balanced salt solution was used to hydrodissect and hydrodelineate the nucleus.   Phacoemulsification was then used in stop and chop fashion to remove the lens nucleus and epinucleus.  The remaining cortex was then removed using the irrigation and aspiration handpiece. Viscoelastic was then placed into the  capsular bag to distend it for lens placement.  A lens was then injected into the capsular bag.  The remaining viscoelastic was aspirated.   Wounds were hydrated with balanced salt solution.  The anterior chamber was inflated to a physiologic pressure with balanced salt solution.  Intracameral vigamox 0.1 mL undiltued was injected into the eye and a drop placed onto the ocular surface.  No wound leaks were noted.  The patient was taken to the recovery room in stable condition without complications of anesthesia or surgery  Benay Pillow 10/28/2022, 10:38 AM

## 2022-10-28 NOTE — Transfer of Care (Signed)
Immediate Anesthesia Transfer of Care Note  Patient: Jeffrey [name]  Procedure(s) Performed: CATARACT EXTRACTION PHACO AND INTRAOCULAR LENS PLACEMENT (IOC) LEFT DIABETIC  1.89  00:19.5 (Left: Eye)  Patient Location: PACU  Anesthesia Type: MAC  Level of Consciousness: awake, alert  and patient cooperative  Airway and Oxygen Therapy: Patient Spontanous Breathing and Patient connected to supplemental oxygen  Post-op Assessment: Post-op Vital signs reviewed, Patient's Cardiovascular Status Stable, Respiratory Function Stable, Patent Airway and No signs of Nausea or vomiting  Post-op Vital Signs: Reviewed and stable  Complications: No notable events documented.

## 2022-10-28 NOTE — Anesthesia Postprocedure Evaluation (Signed)
Anesthesia Post Note  Patient: Blue Isenhart  Procedure(s) Performed: CATARACT EXTRACTION PHACO AND INTRAOCULAR LENS PLACEMENT (IOC) LEFT DIABETIC  1.89  00:19.5 (Left: Eye)  Patient location during evaluation: PACU Anesthesia Type: MAC Level of consciousness: awake and alert Pain management: pain level controlled Vital Signs Assessment: post-procedure vital signs reviewed and stable Respiratory status: spontaneous breathing, nonlabored ventilation and respiratory function stable Cardiovascular status: blood pressure returned to baseline and stable Postop Assessment: no apparent nausea or vomiting Anesthetic complications: no   No notable events documented.   Last Vitals:  Vitals:   10/28/22 1042 10/28/22 1043  BP:  121/67  Pulse: 69 69  Resp: 16 10  Temp:    SpO2: 94% 95%    Last Pain:  Vitals:   10/28/22 1043  TempSrc:   PainSc: 0-No pain                 [place] Ouch

## 2022-10-29 ENCOUNTER — Encounter: Payer: Self-pay | Admitting: Ophthalmology

## 2022-10-29 ENCOUNTER — Other Ambulatory Visit: Payer: Self-pay

## 2022-11-06 NOTE — Discharge Instructions (Signed)

## 2022-11-07 DIAGNOSIS — Z6832 Body mass index (BMI) 32.0-32.9, adult: Secondary | ICD-10-CM | POA: Diagnosis not present

## 2022-11-07 DIAGNOSIS — N1831 Chronic kidney disease, stage 3a: Secondary | ICD-10-CM | POA: Diagnosis not present

## 2022-11-07 DIAGNOSIS — H2511 Age-related nuclear cataract, right eye: Secondary | ICD-10-CM | POA: Diagnosis not present

## 2022-11-07 DIAGNOSIS — E1165 Type 2 diabetes mellitus with hyperglycemia: Secondary | ICD-10-CM | POA: Diagnosis not present

## 2022-11-07 DIAGNOSIS — E6609 Other obesity due to excess calories: Secondary | ICD-10-CM | POA: Diagnosis not present

## 2022-11-11 ENCOUNTER — Encounter: Payer: Self-pay | Admitting: Ophthalmology

## 2022-11-11 ENCOUNTER — Ambulatory Visit: Payer: Medicare Other | Admitting: Anesthesiology

## 2022-11-11 ENCOUNTER — Ambulatory Visit
Admission: RE | Admit: 2022-11-11 | Discharge: 2022-11-11 | Disposition: A | Discharge status: Home / Self Care | Payer: Medicare Other | Attending: Ophthalmology | Admitting: Ophthalmology

## 2022-11-11 ENCOUNTER — Other Ambulatory Visit: Payer: Self-pay

## 2022-11-11 ENCOUNTER — Encounter
Admission: RE | Disposition: A | Discharge status: Home / Self Care | Payer: Self-pay | Source: Home / Self Care | Attending: Ophthalmology

## 2022-11-11 DIAGNOSIS — H2511 Age-related nuclear cataract, right eye: Secondary | ICD-10-CM | POA: Insufficient documentation

## 2022-11-11 DIAGNOSIS — Z7985 Long-term (current) use of injectable non-insulin antidiabetic drugs: Secondary | ICD-10-CM | POA: Insufficient documentation

## 2022-11-11 DIAGNOSIS — Z09 Encounter for follow-up examination after completed treatment for conditions other than malignant neoplasm: Secondary | ICD-10-CM | POA: Diagnosis not present

## 2022-11-11 DIAGNOSIS — G4730 Sleep apnea, unspecified: Secondary | ICD-10-CM | POA: Insufficient documentation

## 2022-11-11 DIAGNOSIS — Z87891 Personal history of nicotine dependence: Secondary | ICD-10-CM | POA: Diagnosis not present

## 2022-11-11 DIAGNOSIS — I10 Essential (primary) hypertension: Secondary | ICD-10-CM | POA: Diagnosis not present

## 2022-11-11 DIAGNOSIS — Z6833 Body mass index (BMI) 33.0-33.9, adult: Secondary | ICD-10-CM | POA: Diagnosis not present

## 2022-11-11 DIAGNOSIS — K219 Gastro-esophageal reflux disease without esophagitis: Secondary | ICD-10-CM | POA: Insufficient documentation

## 2022-11-11 DIAGNOSIS — Z7984 Long term (current) use of oral hypoglycemic drugs: Secondary | ICD-10-CM | POA: Insufficient documentation

## 2022-11-11 DIAGNOSIS — E1136 Type 2 diabetes mellitus with diabetic cataract: Secondary | ICD-10-CM | POA: Insufficient documentation

## 2022-11-11 DIAGNOSIS — E669 Obesity, unspecified: Secondary | ICD-10-CM | POA: Diagnosis not present

## 2022-11-11 DIAGNOSIS — E119 Type 2 diabetes mellitus without complications: Secondary | ICD-10-CM | POA: Diagnosis not present

## 2022-11-11 HISTORY — PX: CATARACT EXTRACTION W/PHACO: SHX586

## 2022-11-11 LAB — GLUCOSE, CAPILLARY: Glucose-Capillary: 217 mg/dL — ABNORMAL HIGH (ref 70–99)

## 2022-11-11 SURGERY — PHACOEMULSIFICATION, CATARACT, WITH IOL INSERTION
Anesthesia: Monitor Anesthesia Care | Site: Eye | Laterality: Right

## 2022-11-11 MED ORDER — MOXIFLOXACIN HCL 0.5 % OP SOLN
OPHTHALMIC | Status: DC | PRN
  Administered 2022-11-11: .2 mL via OPHTHALMIC

## 2022-11-11 MED ORDER — FENTANYL CITRATE (PF) 100 MCG/2ML IJ SOLN
INTRAMUSCULAR | Status: DC | PRN
  Administered 2022-11-11 (×2): 50 ug via INTRAVENOUS

## 2022-11-11 MED ORDER — ARMC OPHTHALMIC DILATING DROPS
1.0000 | OPHTHALMIC | Status: DC | PRN
  Administered 2022-11-11 (×3): 1 via OPHTHALMIC

## 2022-11-11 MED ORDER — LACTATED RINGERS IV SOLN: INTRAVENOUS | Status: DC

## 2022-11-11 MED ORDER — MIDAZOLAM HCL 2 MG/2ML IJ SOLN
INTRAMUSCULAR | Status: DC | PRN
  Administered 2022-11-11: 2 mg via INTRAVENOUS

## 2022-11-11 MED ORDER — SIGHTPATH DOSE#1 BSS IO SOLN
INTRAOCULAR | Status: DC | PRN
  Administered 2022-11-11: 15 mL

## 2022-11-11 MED ORDER — SIGHTPATH DOSE#1 NA HYALUR & NA CHOND-NA HYALUR IO KIT
PACK | INTRAOCULAR | Status: DC | PRN
  Administered 2022-11-11: 1 via OPHTHALMIC

## 2022-11-11 MED ORDER — TETRACAINE HCL 0.5 % OP SOLN
1.0000 [drp] | OPHTHALMIC | Status: DC | PRN
  Administered 2022-11-11 (×3): 1 [drp] via OPHTHALMIC

## 2022-11-11 MED ORDER — SIGHTPATH DOSE#1 BSS IO SOLN
INTRAOCULAR | Status: DC | PRN
  Administered 2022-11-11: 92 mL via OPHTHALMIC

## 2022-11-11 MED ORDER — LIDOCAINE HCL (PF) 2 % IJ SOLN
INTRAOCULAR | Status: DC | PRN
  Administered 2022-11-11: 1 mL via INTRAOCULAR

## 2022-11-11 SURGICAL SUPPLY — 13 items
CATARACT SUITE SIGHTPATH (MISCELLANEOUS) ×1 IMPLANT
DISSECTOR HYDRO NUCLEUS 50X22 (MISCELLANEOUS) ×1 IMPLANT
FEE CATARACT SUITE SIGHTPATH (MISCELLANEOUS) ×1 IMPLANT
GLOVE SURG GAMMEX PI TX LF 7.5 (GLOVE) ×1 IMPLANT
GLOVE SURG SYN 8.5  E (GLOVE) ×1
GLOVE SURG SYN 8.5 E (GLOVE) ×1 IMPLANT
GLOVE SURG SYN 8.5 PF PI (GLOVE) ×1 IMPLANT
LENS IOL TECNIS EYHANCE 21.0 (Intraocular Lens) IMPLANT
NDL FILTER BLUNT 18X1 1/2 (NEEDLE) ×1 IMPLANT
NEEDLE FILTER BLUNT 18X1 1/2 (NEEDLE) ×1 IMPLANT
SYR 3ML LL SCALE MARK (SYRINGE) ×1 IMPLANT
SYR 5ML LL (SYRINGE) ×1 IMPLANT
WATER STERILE IRR 250ML POUR (IV SOLUTION) ×1 IMPLANT

## 2022-11-11 NOTE — Anesthesia Postprocedure Evaluation (Signed)
Anesthesia Post Note  Patient: Jeffrey [name]  Procedure(s) Performed: CATARACT EXTRACTION PHACO AND INTRAOCULAR LENS PLACEMENT (IOC) RIGHT DIABETIC  2.16  00:21.6 (Right: Eye)  Patient location during evaluation: Phase II Anesthesia Type: MAC Level of consciousness: awake and alert Pain management: pain level controlled Vital Signs Assessment: post-procedure vital signs reviewed and stable Respiratory status: spontaneous breathing Cardiovascular status: stable Anesthetic complications: no   No notable events documented.   Last Vitals:  Vitals:   11/11/22 0802 11/11/22 0807  BP: 132/65 135/75  Pulse: 60 63  Resp: 14 16  Temp: (!) 36.3 C (!) 36.3 C  SpO2: 96% 95%    Last Pain:  Vitals:   11/11/22 0807  TempSrc:   PainSc: 0-No pain                 Helayne [place]inole

## 2022-11-11 NOTE — H&P (Signed)
[facility]   Primary Care Physician:  Eulas Post, MD Ophthalmologist: Dr. Benay Pillow  Pre-Procedure History & Physical: HPI:  Jeffrey [name] is a 67 y.o. male here for cataract surgery.   Past Medical History:  Diagnosis Date   Actinic keratosis    Arthritis    shoulder   Cholecystitis    Diabetes mellitus    Type 2   Diverticulosis    Erectile dysfunction    Family history of adverse reaction to anesthesia    mother heart stopped during cardiac cath   GERD (gastroesophageal reflux disease)    History of kidney stones    Hyperlipidemia    Hypertension    Myalgia    Obesity    Peyronie's disease    Plantar fasciitis    Seasonal allergies    Vertigo    2 years ago    Past Surgical History:  Procedure Laterality Date   ARTHROSCOPIC REPAIR ACL     CATARACT EXTRACTION W/PHACO Left 10/28/2022   Procedure: CATARACT EXTRACTION PHACO AND INTRAOCULAR LENS PLACEMENT (IOC) LEFT DIABETIC  1.89  00:19.5;  Surgeon: Eulogio Bear, MD;  Location: [place];  Service: Ophthalmology;  Laterality: Left;   COLONOSCOPY N/A 02/25/2022   Procedure: COLONOSCOPY;  Surgeon: Annamaria Helling, DO;  Location: [facility] ENDOSCOPY;  Service: Gastroenterology;  Laterality: N/A;  DM   HERNIA REPAIR     x 2   LITHOTRIPSY     MENISCUS REPAIR      Prior to Admission medications   Medication Sig Start Date End Date Taking? Authorizing Provider  albuterol (VENTOLIN HFA) 108 (90 Base) MCG/ACT inhaler Inhale 2 puffs into the lungs every 4 (four) hours as needed for wheezing or shortness of breath. 06/24/22  Yes [name], PA-C  ALLERGY RELIEF 10 MG tablet TAKE 1 TABLET BY MOUTH  DAILY 08/31/19  Yes Eulas Post, MD  amLODipine (NORVASC) 10 MG tablet TAKE 1 TABLET DAILY 04/05/22  Yes Eulas Post, MD  aspirin 81 MG tablet Take 81 mg by mouth daily.   Yes [provider]  blood glucose meter kit and supplies Dispense based on patient and  insurance preference. Use up to four times daily as directed. (FOR ICD-10 E10.9, E11.9). 07/16/19  Yes Eulas Post, MD  budesonide-formoterol [facility]) 160-4.5 MCG/ACT inhaler Inhale 2 puffs into the lungs daily.   Yes [provider]  CONTOUR NEXT TEST test strip CHECK BLOOD SUGAR UP TO FOUR TIMES A DAY AS DIRECTED 02/26/21  Yes Eulas Post, MD  Dulaglutide (TRULICITY) 1.5 0000000 SOPN INJECT 1.5MG  05ML SUBCUTANEOUSLY ONCE A WEEK Patient taking differently: INJECT 1.5MG  05ML SUBCUTANEOUSLY ONCE A WEEK, SUNDAYS 06/03/22  Yes Drubel, Ria Comment, PA-C  fluticasone (FLONASE) 50 MCG/ACT nasal spray Place 2 sprays into both nostrils daily. 06/24/22  Yes Drubel, Ria Comment, PA-C  Fluticasone Propionate, Inhal, (FLOVENT DISKUS) 50 MCG/ACT AEPB Inhale 1 puff into the lungs in the morning and at bedtime. 06/24/22  Yes Drubel, Ria Comment, PA-C  glipiZIDE (GLUCOTROL) 10 MG tablet TAKE ONE-HALF (1/2) TABLET DAILY BEFORE BREAKFAST 10/04/21  Yes Eulas Post, MD  loratadine (CLARITIN) 10 MG tablet Take 1 tablet by mouth daily.   Yes [provider]  losartan (COZAAR) 100 MG tablet TAKE 1 TABLET DAILY 04/05/22  Yes Eulas Post, MD  meclizine (ANTIVERT) 25 MG tablet Take 25 mg by mouth 3 (three) times daily as needed for dizziness.   Yes [provider]  metFORMIN (GLUCOPHAGE) 1000 MG  tablet TAKE 1 TABLET TWICE A DAY WITH MEALS 07/19/22  Yes [name], PA-C  metFORMIN (GLUCOPHAGE) 1000 MG tablet Take 1 tablet (1,000 mg total) by mouth 2 (two) times daily with a meal. 07/19/22  Yes Drubel, Ria Comment, PA-C  Microlet Lancets MISC USE UP TO 4 TIMES DAILY AS  DIRECTED 10/22/21  Yes Eulas Post, MD  omega-3 acid ethyl esters (LOVAZA) 1 g capsule TAKE 1 CAPSULE TWICE A DAY 01/02/22  Yes Eulas Post, MD  omeprazole (PRILOSEC) 20 MG capsule TAKE 1 CAPSULE DAILY 04/05/22  Yes Eulas Post, MD  pravastatin (PRAVACHOL) 40 MG tablet TAKE 1 TABLET DAILY AT 6 P.M.  06/20/22  Yes Drubel, Ria Comment, PA-C  triamterene-hydrochlorothiazide (DYAZIDE) 37.5-25 MG capsule Take 1 each (1 capsule total) by mouth every morning. 03/14/22  Yes Eulas Post, MD  celecoxib (CELEBREX) 200 MG capsule Take 1 tablet by mouth daily. Patient not taking: Reported on 11/11/2022    [provider]  omeprazole (PRILOSEC) 20 MG capsule Take 1 capsule by mouth daily.    [provider]  sulfacetamide (BLEPH-10) 10 % ophthalmic solution Place 2 drops into both eyes 4 (four) times daily. 08/23/17   Carmon Ginsberg, PA    Allergies as of 09/23/2022 - Review Complete 07/15/2022  Allergen Reaction Noted   Codeine Hives 12/06/2014    Family History  Problem Relation Age of Onset   Hypertension Father    Heart attack Father    ALS Father    Drug abuse Sister        Secondary to head injury after MVA   Club foot Son        Bilat   Hypertension Brother    Heart disease Brother    Hypertension Brother    Heart disease Brother    Hypertension Brother    Cancer Maternal Grandmother    Diabetes Maternal Grandfather    Cancer Paternal Grandmother    Hypertension Paternal Grandfather    Heart attack Paternal Grandfather    Diabetes Paternal Grandfather    Diabetes Mother        diet controlled    Social History   Socioeconomic History   Marital status: Married    Spouse name: Mateo Flow   Number of children: 3   Years of education: bachelors   Highest education level: Not on file  Occupational History    Employer: [employer]  Tobacco Use   Smoking status: Former    Packs/day: 1.5    Types: Cigarettes    Quit date: 08/11/1984    Years since quitting: 38.2   Smokeless tobacco: Never  Vaping Use   Vaping Use: Never used  Substance and Sexual Activity   Alcohol use: No    Alcohol/week: 0.0 standard drinks of alcohol   Drug use: No   Sexual activity: Not on file  Other Topics Concern   Not on file  Social History Narrative   Mr.  Halas has 3 biological children, and 2 adopted children.   Social Determinants of Health   Financial Resource Strain: Low Risk  (07/19/2022)   Overall Financial Resource Strain (CARDIA)    Difficulty of Paying Living Expenses: Not hard at all  Food Insecurity: No Food Insecurity (07/19/2022)   Hunger Vital Sign    Worried About Running Out of Food in the Last Year: Never true    Ran Out of Food in the Last Year: Never true  Transportation Needs: No Transportation Needs (07/19/2022)   PRAPARE -  Hydrologist (Medical): No    Lack of Transportation (Non-Medical): No  Physical Activity: Insufficiently Active (05/08/2022)   Exercise Vital Sign    Days of Exercise per Week: 2 days    Minutes of Exercise per Session: 20 min  Stress: No Stress Concern Present (05/08/2022)   [place]    Feeling of Stress : Only a little  Social Connections: Moderately Integrated (07/19/2022)   Social Connection and Isolation Panel [NHANES]    Frequency of Communication with Friends and Family: More than three times a week    Frequency of Social Gatherings with Friends and Family: More than three times a week    Attends Religious Services: More than 4 times per year    Active Member of [employer] or Organizations: No    Attends Archivist Meetings: Never    Marital Status: Married  Human resources officer Violence: Not At Risk (05/08/2022)   Humiliation, Afraid, Rape, and Kick questionnaire    Fear of Current or Ex-Partner: No    Emotionally Abused: No    Physically Abused: No    Sexually Abused: No    Review of Systems: See HPI, otherwise negative ROS  Physical Exam: BP (!) 143/64   Pulse 60   Temp (!) 97.4 F (36.3 C) (Temporal)   Resp 17   Ht 5\' 5"  (1.651 m)   Wt 90.9 kg   SpO2 97%   BMI 33.35 kg/m  General:   Alert, cooperative in NAD Head:  Normocephalic and atraumatic. Respiratory:  Normal work of  breathing. Cardiovascular:  RRR  Impression/Plan: Devender Chillis is here for cataract surgery.  Risks, benefits, limitations, and alternatives regarding cataract surgery have been reviewed with the patient.  Questions have been answered.  All parties agreeable.   Benay Pillow, MD  11/11/2022, 7:12 AM

## 2022-11-11 NOTE — Op Note (Signed)
OPERATIVE NOTE  Ackeem Bailin [id number] 11/11/2022   PREOPERATIVE DIAGNOSIS:  Nuclear sclerotic cataract right eye.  H25.11   POSTOPERATIVE DIAGNOSIS:    Nuclear sclerotic cataract right eye.     PROCEDURE:  Phacoemusification with posterior chamber intraocular lens placement of the right eye   LENS:   Implant Name Type Inv. Item Serial No. Manufacturer Lot No. LRB No. Used Action  LENS IOL TECNIS EYHANCE 21.0 - [id number] Intraocular Lens LENS IOL TECNIS EYHANCE 21.0 [id number] [employer]  Right 1 Implanted       Procedure(s): CATARACT EXTRACTION PHACO AND INTRAOCULAR LENS PLACEMENT (IOC) RIGHT DIABETIC  2.16  00:21.6 (Right)  DIB00 +21.0   ULTRASOUND TIME: 0 minutes 21 seconds.  CDE 2.16   SURGEON:  Benay Pillow, MD, MPH  ANESTHESIOLOGIST: Anesthesiologist: Helayne Seminole, MD CRNA: Bynum, [place], CRNA   ANESTHESIA:  Topical with tetracaine drops augmented with 1% preservative-free intracameral lidocaine.  ESTIMATED BLOOD LOSS: less than 1 mL.   COMPLICATIONS:  None.   DESCRIPTION OF PROCEDURE:  The patient was identified in the holding room and transported to the operating room and placed in the supine position under the operating microscope.  The right eye was identified as the operative eye and it was prepped and draped in the usual sterile ophthalmic fashion.   A 1.0 millimeter clear-corneal paracentesis was made at the 10:30 position. 0.5 ml of preservative-free 1% lidocaine with epinephrine was injected into the anterior chamber.  The anterior chamber was filled with viscoelastic.  A 2.4 millimeter keratome was used to make a near-clear corneal incision at the 8:00 position.  A curvilinear capsulorrhexis was made with a cystotome and capsulorrhexis forceps.  Balanced salt solution was used to hydrodissect and hydrodelineate the nucleus.   Phacoemulsification was then used in stop and chop fashion to remove the lens nucleus and epinucleus.  The remaining cortex was  then removed using the irrigation and aspiration handpiece. Viscoelastic was then placed into the capsular bag to distend it for lens placement.  A lens was then injected into the capsular bag.  The remaining viscoelastic was aspirated.   Wounds were hydrated with balanced salt solution.  The anterior chamber was inflated to a physiologic pressure with balanced salt solution.   Intracameral vigamox 0.1 mL undiluted was injected into the eye and a drop placed onto the ocular surface.  No wound leaks were noted.  The patient was taken to the recovery room in stable condition without complications of anesthesia or surgery  Benay Pillow 11/11/2022, 8:01 AM

## 2022-11-11 NOTE — Anesthesia Preprocedure Evaluation (Signed)
Anesthesia Evaluation  Patient identified by MRN, date of birth, ID band Patient awake    Airway Mallampati: III  TM Distance: >3 FB Neck ROM: Full    Dental no notable dental hx.    Pulmonary sleep apnea and Continuous Positive Airway Pressure Ventilation , former smoker   Pulmonary exam normal        Cardiovascular Exercise Tolerance: Good hypertension, Normal cardiovascular exam Rhythm:Regular     Neuro/Psych negative neurological ROS  negative psych ROS   GI/Hepatic Neg liver ROS,GERD  Controlled and Medicated,,  Endo/Other  diabetes  Morbid obesity  Renal/GU negative Renal ROS     Musculoskeletal negative musculoskeletal ROS (+)    Abdominal Normal abdominal exam  (+)   Peds  Hematology negative hematology ROS (+)   Anesthesia Other Findings   Reproductive/Obstetrics negative OB ROS                             Anesthesia Physical Anesthesia Plan  ASA: 3  Anesthesia Plan: MAC   Post-op Pain Management:    Induction:   PONV Risk Score and Plan:   Airway Management Planned:   Additional Equipment:   Intra-op Plan:   Post-operative Plan:   Informed Consent: I have reviewed the patients History and Physical, chart, labs and discussed the procedure including the risks, benefits and alternatives for the proposed anesthesia with the patient or authorized representative who has indicated his/her understanding and acceptance.       Plan Discussed with: CRNA  Anesthesia Plan Comments:        Anesthesia Quick Evaluation

## 2022-11-11 NOTE — Transfer of Care (Signed)
Immediate Anesthesia Transfer of Care Note  Patient: Jeffrey [name]  Procedure(s) Performed: CATARACT EXTRACTION PHACO AND INTRAOCULAR LENS PLACEMENT (IOC) RIGHT DIABETIC  2.16  00:21.6 (Right: Eye)  Patient Location: PACU  Anesthesia Type: MAC  Level of Consciousness: awake, alert  and patient cooperative  Airway and Oxygen Therapy: Patient Spontanous Breathing and Patient connected to supplemental oxygen  Post-op Assessment: Post-op Vital signs reviewed, Patient's Cardiovascular Status Stable, Respiratory Function Stable, Patent Airway and No signs of Nausea or vomiting  Post-op Vital Signs: Reviewed and stable  Complications: No notable events documented.

## 2022-11-11 NOTE — Addendum Note (Signed)
Addendum  created 11/11/22 1443 by Helayne Seminole, MD   Attestation recorded in [place], [place] filed

## 2022-11-12 ENCOUNTER — Encounter: Payer: Self-pay | Admitting: Ophthalmology

## 2022-11-23 ENCOUNTER — Other Ambulatory Visit: Payer: Self-pay | Admitting: Physician Assistant

## 2022-11-23 DIAGNOSIS — E1169 Type 2 diabetes mellitus with other specified complication: Secondary | ICD-10-CM

## 2022-12-13 DIAGNOSIS — Z0000 Encounter for general adult medical examination without abnormal findings: Secondary | ICD-10-CM | POA: Diagnosis not present

## 2022-12-13 DIAGNOSIS — H9193 Unspecified hearing loss, bilateral: Secondary | ICD-10-CM | POA: Diagnosis not present

## 2022-12-13 DIAGNOSIS — E1165 Type 2 diabetes mellitus with hyperglycemia: Secondary | ICD-10-CM | POA: Diagnosis not present

## 2022-12-13 DIAGNOSIS — I129 Hypertensive chronic kidney disease with stage 1 through stage 4 chronic kidney disease, or unspecified chronic kidney disease: Secondary | ICD-10-CM | POA: Diagnosis not present

## 2023-03-26 ENCOUNTER — Ambulatory Visit: Payer: Medicare Other

## 2023-03-26 DIAGNOSIS — Z1211 Encounter for screening for malignant neoplasm of colon: Secondary | ICD-10-CM | POA: Diagnosis not present

## 2023-03-26 DIAGNOSIS — K621 Rectal polyp: Secondary | ICD-10-CM | POA: Diagnosis not present

## 2023-03-26 DIAGNOSIS — K5730 Diverticulosis of large intestine without perforation or abscess without bleeding: Secondary | ICD-10-CM | POA: Diagnosis not present

## 2023-03-26 DIAGNOSIS — D124 Benign neoplasm of descending colon: Secondary | ICD-10-CM | POA: Diagnosis not present

## 2023-03-26 DIAGNOSIS — K641 Second degree hemorrhoids: Secondary | ICD-10-CM | POA: Diagnosis not present

## 2023-03-26 DIAGNOSIS — Z86010 Personal history of colonic polyps: Secondary | ICD-10-CM | POA: Diagnosis not present

## 2023-03-26 DIAGNOSIS — D175 Benign lipomatous neoplasm of intra-abdominal organs: Secondary | ICD-10-CM | POA: Diagnosis not present

## 2023-04-04 ENCOUNTER — Other Ambulatory Visit: Payer: Self-pay | Admitting: Medical Genetics

## 2023-04-04 DIAGNOSIS — Z006 Encounter for examination for normal comparison and control in clinical research program: Secondary | ICD-10-CM

## 2023-04-19 ENCOUNTER — Ambulatory Visit
Admission: EM | Admit: 2023-04-19 | Discharge: 2023-04-19 | Disposition: A | Discharge status: Home / Self Care | Payer: Medicare Other

## 2023-04-19 DIAGNOSIS — E11628 Type 2 diabetes mellitus with other skin complications: Secondary | ICD-10-CM

## 2023-04-19 DIAGNOSIS — L03011 Cellulitis of right finger: Secondary | ICD-10-CM | POA: Diagnosis not present

## 2023-04-19 MED ORDER — CEFTRIAXONE SODIUM 500 MG IJ SOLR
500.0000 mg | Freq: Once | INTRAMUSCULAR | Status: AC
  Administered 2023-04-19: 500 mg via INTRAMUSCULAR

## 2023-04-19 MED ORDER — DOXYCYCLINE HYCLATE 100 MG PO CAPS: 100.0000 mg | ORAL_CAPSULE | Freq: Two times a day (BID) | ORAL | 0 refills | Status: DC

## 2023-04-19 NOTE — ED Triage Notes (Signed)
Patient to Urgent Care with complaints of a wound infection present to his right thumb. Wound w/ purulent drainage x3 days.  Patient had a splinter seven day ago and was able to remove the most of piece of wood the same day. Reports four days later he removed another piece.  Pt diabetic.

## 2023-04-19 NOTE — Discharge Instructions (Addendum)
Oral antibiotics today.  You were given a Rocephin shot which is a strong antibiotic to begin the process of resolving your infection however it is important to complete the entire course of your oral antibiotics.  Continue to monitor for signs of worsening infection if any concerns arise return for evaluation.  As discussed keep area covered until it heals to avoid reinjury.

## 2023-04-20 ENCOUNTER — Emergency Department
Admission: EM | Admit: 2023-04-20 | Discharge: 2023-04-20 | Disposition: A | Discharge status: Home / Self Care | Payer: Medicare Other | Attending: Emergency Medicine | Admitting: Emergency Medicine

## 2023-04-20 ENCOUNTER — Other Ambulatory Visit: Payer: Self-pay

## 2023-04-20 DIAGNOSIS — E1165 Type 2 diabetes mellitus with hyperglycemia: Secondary | ICD-10-CM | POA: Insufficient documentation

## 2023-04-20 DIAGNOSIS — R739 Hyperglycemia, unspecified: Secondary | ICD-10-CM

## 2023-04-20 LAB — CBG MONITORING, ED
Glucose-Capillary: 341 mg/dL — ABNORMAL HIGH (ref 70–99)
Glucose-Capillary: 195 mg/dL — ABNORMAL HIGH (ref 70–99)

## 2023-04-20 LAB — BASIC METABOLIC PANEL
Anion gap: 11 (ref 5–15)
BUN: 27 mg/dL — ABNORMAL HIGH (ref 8–23)
CO2: 20 mmol/L — ABNORMAL LOW (ref 22–32)
Calcium: 9.3 mg/dL (ref 8.9–10.3)
Chloride: 98 mmol/L (ref 98–111)
Creatinine, Ser: 1.25 mg/dL — ABNORMAL HIGH (ref 0.61–1.24)
GFR, Estimated: >60 mL/min (ref >60)
Glucose, Bld: 363 mg/dL — ABNORMAL HIGH (ref 70–99)
Potassium: 4.2 mmol/L (ref 3.5–5.1)
Sodium: 129 mmol/L — ABNORMAL LOW (ref 135–145)

## 2023-04-20 LAB — CBC
HCT: 41.8 % (ref 39.0–52.0)
Hemoglobin: 15 g/dL (ref 13.0–17.0)
MCH: 29.7 pg (ref 26.0–34.0)
MCHC: 35.9 g/dL (ref 30.0–36.0)
MCV: 82.8 fL (ref 80.0–100.0)
Platelets: 228 10*3/uL (ref 150–400)
RBC: 5.05 MIL/uL (ref 4.22–5.81)
RDW: 13.2 % (ref 11.5–15.5)
WBC: 6.3 10*3/uL (ref 4.0–10.5)
nRBC: 0 % (ref 0.0–0.2)

## 2023-04-20 MED ORDER — SODIUM CHLORIDE 0.9 % IV BOLUS
1000.0000 mL | Freq: Once | INTRAVENOUS | Status: AC
  Administered 2023-04-20: 1000 mL via INTRAVENOUS

## 2023-04-20 NOTE — ED Provider Notes (Signed)
[facility] Provider Note    Event Date/Time   First MD Initiated Contact with Patient 04/20/23 1919     (approximate)   History   Hyperglycemia   HPI  Jeffrey [name] is a 67 year old male presenting to the emergency department for evaluation of elevated blood sugar.  Patient recently was treated for an infection of his right thumb and has been on antibiotics.  However, today he noticed that his blood glucose was elevated in the 300s.  Reports he is normally under 250.  No fevers or chills.  No recent steroid use.  Does report he has had some recent changes to his diabetes medication.  No chest pain, shortness of breath, GI symptoms.  Takes multiple oral medications for his diabetes, not on insulin.     Physical Exam   Triage Vital Signs: ED Triage Vitals [04/20/23 1639]  Encounter Vitals Group     BP (!) 154/79     Systolic BP Percentile      Diastolic BP Percentile      Pulse Rate 71     Resp 16     Temp 98 F (36.7 C)     Temp Source Oral     SpO2 100 %     Weight 200 lb 6.4 oz (90.9 kg)     Height 5\' 5"  (1.651 m)     Head Circumference      Peak Flow      Pain Score 0     Pain Loc      Pain Education      Exclude from Growth Chart     Most recent vital signs: Vitals:   04/20/23 2030 04/20/23 2100  BP: (!) 163/76 (!) 177/84  Pulse: (!) 58 62  Resp: 13 12  Temp:  98.2 F (36.8 C)  SpO2: 100% 96%     General: Awake, interactive  CV:  Regular rate, good peripheral perfusion.  Resp:  Lungs clear, unlabored respirations.  Abd:  Soft, nondistended.  Neuro:  Symmetric facial movement, fluid speech MSK:  Area of erythema and small area of skin breakdown over the thumb, no crepitus, necrosis   ED Results / Procedures / Treatments   Labs (all labs ordered are listed, but only abnormal results are displayed) Labs Reviewed  BASIC METABOLIC PANEL - Abnormal; Notable for the following components:      Result Value   Sodium 129  (*)    CO2 20 (*)    Glucose, Bld 363 (*)    BUN 27 (*)    Creatinine, Ser 1.25 (*)    All other components within normal limits  CBG MONITORING, ED - Abnormal; Notable for the following components:   Glucose-Capillary 341 (*)    All other components within normal limits  CBG MONITORING, ED - Abnormal; Notable for the following components:   Glucose-Capillary 195 (*)    All other components within normal limits  CBC  CBG MONITORING, ED     EKG EKG independently reviewed interpreted by myself (ER attending) demonstrates:    RADIOLOGY Imaging independently reviewed and interpreted by myself demonstrates:    PROCEDURES:  Critical Care performed: No  Procedures   MEDICATIONS ORDERED IN ED: Medications  sodium chloride 0.9 % bolus 1,000 mL (0 mLs Intravenous Stopped 04/20/23 2135)     IMPRESSION / MDM / ASSESSMENT AND PLAN / ED COURSE  I reviewed the triage vital signs and the nursing notes.  Differential diagnosis includes, but is not  limited to, hyperglycemia with her without DKA, no evidence of HHS, possible hyperglycemia due to recent infection, stressors, medication adjustment  Patient's presentation is most consistent with acute presentation with potential threat to life or bodily function.  67 year old male presenting to the emergency room for evaluation of hyperglycemia.  Lab work does demonstrate hyperglycemia with a glucose of 363.  Bicarb slightly low at 20, but normal anion gap, not consistent with DKA.  Patient was given IV fluids with improvement in his blood sugar.  He is comfortable with discharge home and outpatient follow-up.  Strict return precautions provided.  Patient discharged stable condition.      FINAL CLINICAL IMPRESSION(S) / ED DIAGNOSES   Final diagnoses:  Hyperglycemia     Rx / DC Orders   ED Discharge Orders     None        Note:  This document was prepared using Dragon voice recognition software and may include unintentional  dictation errors.   Trinna Post, MD 04/20/23 2149

## 2023-04-20 NOTE — ED Notes (Signed)
RN to bedside to introduce self to pt. Pt resting in bed. Wife at bedside. Pt is caox4, in no acute distress.

## 2023-04-20 NOTE — ED Triage Notes (Signed)
C/O hyperglycemia today.  CBG> 300.  States he has a thumb infection to right thumb and was seen by PCP yesterday and given a shot of ?antibiotic and st rated on amoxicillin yesterday.

## 2023-04-20 NOTE — Discharge Instructions (Signed)
You were seen in the ER today for your elevated blood sugar.  Fortunately we did not see signs of DKA.  Please continue to take your medications as directed.  Follow with your primary care doctor for further evaluation.

## 2023-04-21 NOTE — Group Note (Deleted)

## 2023-04-24 NOTE — ED Provider Notes (Signed)
[facility]    CSN: [id number] Arrival date & time: 04/19/23  1041      History   Chief Complaint Chief Complaint  Patient presents with   Wound Infection    HPI Jeffrey [name] is a 67 y.o. male.   HPI Patient with a history of diabetes presents today with an infection involving right thumb.  Patient reports approximately 7 days ago he removed a splinter or piece of wood the same day from his finger.  He noticed a few days later a residual piece of wood particle remaining in the thumb.  He remove that piece and has noticed increasing pain and purulent drainage over the last 3 days.  He has not had fever, nausea or vomiting.  He endorses intact sensation in right thumb.  He is concerned for infection given he has a history of diabetes.  Past Medical History:  Diagnosis Date   Actinic keratosis    Arthritis    shoulder   Cholecystitis    Diabetes mellitus (HCC)    Type 2   Diverticulosis    Erectile dysfunction    Family history of adverse reaction to anesthesia    mother heart stopped during cardiac cath   GERD (gastroesophageal reflux disease)    History of kidney stones    Hyperlipidemia    Hypertension    Myalgia    Obesity    Peyronie's disease    Plantar fasciitis    Seasonal allergies    Vertigo    2 years ago    Patient Active Problem List   Diagnosis Date Noted   Primary localized osteoarthritis of pelvic region and thigh 05/08/2022   Thoracic neuritis 05/08/2022   Carotid stenosis, asymptomatic, left 09/18/2021   Impingement syndrome of left shoulder region 06/13/2021   Pancreatitis 07/16/2015   ED (erectile dysfunction) of organic origin 04/18/2015   Hypertension associated with diabetes (HCC) 12/06/2014   Seasonal allergies 12/06/2014   Diverticulosis 12/06/2014   GERD (gastroesophageal reflux disease) 12/06/2014   Type 2 diabetes mellitus with hyperglycemia, without long-term current use of insulin (HCC) 12/06/2014   Plantar  fasciitis 12/06/2014   Actinic keratosis 12/06/2014   Peyronie's disease 12/06/2014   Obesity 12/06/2014   Dyslipidemia with low high density lipoprotein (HDL) cholesterol with hypertriglyceridemia due to type 2 diabetes mellitus (HCC) 12/06/2014    Past Surgical History:  Procedure Laterality Date   ARTHROSCOPIC REPAIR ACL     CATARACT EXTRACTION W/PHACO Left 10/28/2022   Procedure: CATARACT EXTRACTION PHACO AND INTRAOCULAR LENS PLACEMENT (IOC) LEFT DIABETIC  1.89  00:19.5;  Surgeon: Nevada Crane, MD;  Location: [facility];  Service: Ophthalmology;  Laterality: Left;   CATARACT EXTRACTION W/PHACO Right 11/11/2022   Procedure: CATARACT EXTRACTION PHACO AND INTRAOCULAR LENS PLACEMENT (IOC) RIGHT DIABETIC  2.16  00:21.6;  Surgeon: Nevada Crane, MD;  Location: [facility];  Service: Ophthalmology;  Laterality: Right;   COLONOSCOPY N/A 02/25/2022   Procedure: COLONOSCOPY;  Surgeon: Jaynie Collins, DO;  Location: [facility];  Service: Gastroenterology;  Laterality: N/A;  DM   HERNIA REPAIR     x 2   LITHOTRIPSY     MENISCUS REPAIR         Home Medications    Prior to Admission medications   Medication Sig Start Date End Date Taking? Authorizing Provider  doxycycline (VIBRAMYCIN) 100 MG capsule Take 1 capsule (100 mg total) by mouth 2 (two) times daily. 04/19/23  Yes Bing Neighbors, NP  albuterol (VENTOLIN HFA) 108 (90 Base) MCG/ACT inhaler Inhale 2 puffs into the lungs every 4 (four) hours as needed for wheezing or shortness of breath. 06/24/22   Alfredia Ferguson, PA-C  ALLERGY RELIEF 10 MG tablet TAKE 1 TABLET BY MOUTH  DAILY 08/31/19   Bosie Clos, MD  amLODipine (NORVASC) 10 MG tablet TAKE 1 TABLET DAILY 04/05/22   Bosie Clos, MD  aspirin 81 MG tablet Take 81 mg by mouth daily.    [provider]  blood glucose meter kit and supplies Dispense based on patient and insurance preference. Use up to four times daily as directed.  (FOR ICD-10 E10.9, E11.9). 07/16/19   Bosie Clos, MD  budesonide-formoterol [facility]) 160-4.5 MCG/ACT inhaler Inhale 2 puffs into the lungs daily.    [provider]  celecoxib (CELEBREX) 200 MG capsule Take 1 tablet by mouth daily. Patient not taking: Reported on 11/11/2022    [provider]  CONTOUR NEXT TEST test strip CHECK BLOOD SUGAR UP TO FOUR TIMES A DAY AS DIRECTED 02/26/21   Bosie Clos, MD  Dulaglutide (TRULICITY) 1.5 MG/0.5ML SOPN INJECT 1.5MG  SUBCUTANEOUSLY ONCE A WEEK Patient not taking: Reported on 04/19/2023 06/03/22   Alfredia Ferguson, PA-C  FARXIGA 5 MG TABS tablet Take 5 mg by mouth daily.    [provider]  fluticasone (FLONASE) 50 MCG/ACT nasal spray Place 2 sprays into both nostrils daily. 06/24/22   Drubel, Lillia Abed, PA-C  Fluticasone Propionate, Inhal, (FLOVENT DISKUS) 50 MCG/ACT AEPB Inhale 1 puff into the lungs in the morning and at bedtime. 06/24/22   Alfredia Ferguson, PA-C  glipiZIDE (GLUCOTROL) 10 MG tablet TAKE ONE-HALF (1/2) TABLET DAILY BEFORE BREAKFAST 10/04/21   Bosie Clos, MD  loratadine (CLARITIN) 10 MG tablet Take 1 tablet by mouth daily.    [provider]  losartan (COZAAR) 100 MG tablet TAKE 1 TABLET DAILY 04/05/22   Bosie Clos, MD  meclizine (ANTIVERT) 25 MG tablet Take 25 mg by mouth 3 (three) times daily as needed for dizziness.    [provider]  metFORMIN (GLUCOPHAGE) 1000 MG tablet TAKE 1 TABLET TWICE A DAY WITH MEALS 07/19/22   Drubel, Lillia Abed, PA-C  metFORMIN (GLUCOPHAGE) 1000 MG tablet Take 1 tablet (1,000 mg total) by mouth 2 (two) times daily with a meal. 07/19/22   Drubel, Lillia Abed, PA-C  Microlet Lancets MISC USE UP TO 4 TIMES DAILY AS  DIRECTED 10/22/21   Bosie Clos, MD  omega-3 acid ethyl esters (LOVAZA) 1 g capsule TAKE 1 CAPSULE TWICE A DAY 01/02/22   Bosie Clos, MD  omeprazole (PRILOSEC) 20 MG capsule TAKE 1 CAPSULE DAILY 04/05/22   Bosie Clos, MD   omeprazole (PRILOSEC) 20 MG capsule Take 1 capsule by mouth daily.    [provider]  pravastatin (PRAVACHOL) 40 MG tablet TAKE 1 TABLET DAILY AT 6 P.M. 06/20/22   Drubel, Lillia Abed, PA-C  sulfacetamide (BLEPH-10) 10 % ophthalmic solution Place 2 drops into both eyes 4 (four) times daily. 08/23/17   Anola Gurney, PA  triamterene-hydrochlorothiazide (DYAZIDE) 37.5-25 MG capsule Take 1 each (1 capsule total) by mouth every morning. 03/14/22   Bosie Clos, MD    Family History Family History  Problem Relation Age of Onset   Hypertension Father    Heart attack Father    ALS Father    Drug abuse Sister        Secondary to head injury after MVA   Club foot Son  Bilat   Hypertension Brother    Heart disease Brother    Hypertension Brother    Heart disease Brother    Hypertension Brother    Cancer Maternal Grandmother    Diabetes Maternal Grandfather    Cancer Paternal Grandmother    Hypertension Paternal Grandfather    Heart attack Paternal Grandfather    Diabetes Paternal Grandfather    Diabetes Mother        diet controlled    Social History Social History   Tobacco Use   Smoking status: Former    Current packs/day: 0.00    Types: Cigarettes    Quit date: 08/11/1984    Years since quitting: 38.7   Smokeless tobacco: Never  Vaping Use   Vaping status: Never Used  Substance Use Topics   Alcohol use: No    Alcohol/week: 0.0 standard drinks of alcohol   Drug use: No     Allergies   Codeine   Review of Systems Review of Systems Pertinent negatives listed in HPI   Physical Exam Triage Vital Signs ED Triage Vitals  Encounter Vitals Group     BP 04/19/23 1051 (!) 163/71     Systolic BP Percentile --      Diastolic BP Percentile --      Pulse Rate 04/19/23 1051 82     Resp 04/19/23 1051 18     Temp 04/19/23 1051 97.7 F (36.5 C)     Temp src --      SpO2 04/19/23 1051 97 %     Weight --      Height --      Head Circumference --       Peak Flow --      Pain Score 04/19/23 1049 7     Pain Loc --      Pain Education --      Exclude from Growth Chart --    No data found.  Updated Vital Signs BP (!) 163/71   Pulse 82   Temp 97.7 F (36.5 C)   Resp 18   SpO2 97%   Visual Acuity Right Eye Distance:   Left Eye Distance:   Bilateral Distance:    Right Eye Near:   Left Eye Near:    Bilateral Near:     Physical Exam Vitals reviewed.  Constitutional:      Appearance: Normal appearance.  HENT:     Head: Normocephalic and atraumatic.  Eyes:     Extraocular Movements: Extraocular movements intact.     Pupils: Pupils are equal, round, and reactive to light.  Cardiovascular:     Rate and Rhythm: Normal rate and regular rhythm.  Pulmonary:     Effort: Pulmonary effort is normal.     Breath sounds: Normal breath sounds.  Skin:    Capillary Refill: Capillary refill takes less than 2 seconds.     Comments: Right thumb (palmer) swelling (entire thumb) tenderness with dried purulent discharge present   Neurological:     General: No focal deficit present.     Mental Status: He is alert.      UC Treatments / Results  Labs (all labs ordered are listed, but only abnormal results are displayed) Labs Reviewed - No data to display  EKG   Radiology No results found.  Procedures Procedures (including critical care time)  Medications Ordered in UC Medications  cefTRIAXone (ROCEPHIN) injection 500 mg (500 mg Intramuscular Given 04/19/23 1110)    Initial Impression / Assessment and Plan /  UC Course  I have reviewed the triage vital signs and the nursing notes.  Pertinent labs & imaging results that were available during my care of the patient were reviewed by me and considered in my medical decision making (see chart for details).   Cellulitis or right thumb, patient with type 2 diabetes Rocephin IM given here in clinic. Continue home treatment per discharge medication discharge orders. Return precautions  given  Final Clinical Impressions(s) / UC Diagnoses   Final diagnoses:  Cellulitis of right thumb  Type 2 diabetes mellitus with other skin complication, without long-term current use of insulin [facility])     Discharge Instructions      Oral antibiotics today.  You were given a Rocephin shot which is a strong antibiotic to begin the process of resolving your infection however it is important to complete the entire course of your oral antibiotics.  Continue to monitor for signs of worsening infection if any concerns arise return for evaluation.  As discussed keep area covered until it heals to avoid reinjury.   ED Prescriptions     Medication Sig Dispense Auth. Provider   doxycycline (VIBRAMYCIN) 100 MG capsule Take 1 capsule (100 mg total) by mouth 2 (two) times daily. 20 capsule Bing Neighbors, NP      PDMP not reviewed this encounter.   Bing Neighbors, NP 04/24/23 [phone]

## 2023-05-16 ENCOUNTER — Telehealth: Payer: Self-pay

## 2023-05-16 NOTE — Telephone Encounter (Signed)
Transition Care Management Follow-up Telephone Call Date of discharge and from where: [facility] 9/8 How have you been since you were released from the hospital? Doing ok and following up with PCP and ENT Any questions or concerns? No  Items Reviewed: Did the pt receive and understand the discharge instructions provided? Yes  Medications obtained and verified? Yes  Other? No  Any new allergies since your discharge? Yes  Dietary orders reviewed? Yes Do you have support at home? Yes    Follow up appointments reviewed:  PCP Hospital f/u appt confirmed? Yes  Scheduled to see  on  @ . Specialist Hospital f/u appt confirmed? Yes  Scheduled to see  on  @ . Are transportation arrangements needed? No  If their condition worsens, is the pt aware to call PCP or go to the Emergency Dept.? Yes Was the patient provided with contact information for the PCP's office or ED? Yes Was to pt encouraged to call back with questions or concerns? Yes

## 2023-05-23 ENCOUNTER — Encounter: Payer: Self-pay | Admitting: Physician Assistant

## 2023-06-05 ENCOUNTER — Other Ambulatory Visit: Payer: Medicare Other | Attending: Medical Genetics

## 2023-08-10 ENCOUNTER — Other Ambulatory Visit: Payer: Self-pay | Admitting: Physician Assistant

## 2023-08-10 DIAGNOSIS — E1165 Type 2 diabetes mellitus with hyperglycemia: Secondary | ICD-10-CM

## 2023-08-17 ENCOUNTER — Other Ambulatory Visit: Payer: Self-pay | Admitting: Physician Assistant

## 2023-08-17 DIAGNOSIS — E785 Hyperlipidemia, unspecified: Secondary | ICD-10-CM

## 2023-08-22 ENCOUNTER — Telehealth: Payer: Self-pay | Admitting: Family Medicine

## 2023-08-22 NOTE — Telephone Encounter (Signed)
 Billie from [facility] calling about pt getting an alternative med, for flovent diskus, since it shows discontinued. Ref # is [id number]. Fx is [phone] . Order may be cancelled if no response back by Monday

## 2023-08-22 NOTE — Telephone Encounter (Signed)
 I spoke with the patient and he is going to contact his PCP and get this addressed

## 2023-08-26 ENCOUNTER — Telehealth: Payer: Self-pay | Admitting: Family Medicine

## 2023-08-26 NOTE — Telephone Encounter (Signed)
 Aram Beecham from [facility] called in , states the med,  flovent discus 50 mg is discontinued, so they need to know what to sent pt. Please cb

## 2023-09-23 ENCOUNTER — Telehealth: Payer: Self-pay | Admitting: Cardiovascular Disease

## 2023-09-23 NOTE — Telephone Encounter (Signed)
   Pre-operative Risk Assessment    Patient Name: Jeffrey [name]  DOB: 05-14-1956 MRN: [id number]   Date of last office visit: 09/22/19 Date of next office visit: n/a   Request for Surgical Clearance    Procedure:  Left Total Knee Arthroplasty  Date of Surgery:  11/13/2502/03/25                                Surgeon:  not listed Surgeon's Group or Practice Name:  [facility] Phone number:  [phone] Fax number:  [phone]   Type of Clearance Requested:   - Medical    Type of Anesthesia:   Choice   Additional requests/questions:    Signed, Narda Amber   09/23/2023, 3:37 PM

## 2023-09-24 ENCOUNTER — Telehealth: Payer: Self-pay | Admitting: Cardiovascular Disease

## 2023-09-24 NOTE — Telephone Encounter (Signed)
   Name: Jeffrey [name]  DOB: [birth year]  MRN: [id number]  Primary Cardiologist: None  Chart reviewed as part of pre-operative protocol coverage. Because of Jeffrey [name]'s past medical history and time since last visit, he will require a follow-up in-office visit in order to better assess preoperative cardiovascular risk.  Pre-op covering staff: - Please schedule appointment and call patient to inform them. If patient already had an upcoming appointment within acceptable timeframe, please add "pre-op clearance" to the appointment notes so provider is aware. - Please contact requesting surgeon's office via preferred method (i.e, phone, fax) to inform them of need for appointment prior to surgery.   Napoleon Form, Leodis Rains, NP  09/24/2023, 7:25 AM

## 2023-09-24 NOTE — Telephone Encounter (Signed)
Lvm for patient that an appointment is needed for his up and coming procedure. Message sent to [facility] scheduling for appt assisitance

## 2023-09-24 NOTE — Telephone Encounter (Signed)
Patient returned Pre-op call.  Appointment scheduled on 2/13.

## 2023-09-24 NOTE — Progress Notes (Deleted)
 Cardiology Office Note    Date:  09/24/2023   ID:  Jeffrey [name], Jeffrey [name] Jan 20, 1956, MRN [id number]  PCP:  Bosie Clos, MD  Cardiologist:  Julien Nordmann, MD  Electrophysiologist:  None   Chief Complaint: Preoperative cardiac risk stratification  History of Present Illness:   Jeffrey [name] is a 68 y.o. male with history of ***  ***  [facility] Activity Status Index: *** METs Revised Cardiac Risk Index:   Labs independently reviewed: 06/2023 - A1c 9.3, potassium 4.4, BUN 33, serum creatinine 1.4, albumin AST/ALT normal, TC 201, TG 1357, HDL 29 04/2023 - Hgb 15.0, PLT 228 06/2022 - TC 161, TG 596, HDL 29 04/2021 - TSH normal  Past Medical History:  Diagnosis Date   Actinic keratosis    Arthritis    shoulder   Cholecystitis    Diabetes mellitus (HCC)    Type 2   Diverticulosis    Erectile dysfunction    Family history of adverse reaction to anesthesia    mother heart stopped during cardiac cath   GERD (gastroesophageal reflux disease)    History of kidney stones    Hyperlipidemia    Hypertension    Myalgia    Obesity    Peyronie's disease    Plantar fasciitis    Seasonal allergies    Vertigo    2 years ago    Past Surgical History:  Procedure Laterality Date   ARTHROSCOPIC REPAIR ACL     CATARACT EXTRACTION W/PHACO Left 10/28/2022   Procedure: CATARACT EXTRACTION PHACO AND INTRAOCULAR LENS PLACEMENT (IOC) LEFT DIABETIC  1.89  00:19.5;  Surgeon: Nevada Crane, MD;  Location: [facility];  Service: Ophthalmology;  Laterality: Left;   CATARACT EXTRACTION W/PHACO Right 11/11/2022   Procedure: CATARACT EXTRACTION PHACO AND INTRAOCULAR LENS PLACEMENT (IOC) RIGHT DIABETIC  2.16  00:21.6;  Surgeon: Nevada Crane, MD;  Location: [facility];  Service: Ophthalmology;  Laterality: Right;   COLONOSCOPY N/A 02/25/2022   Procedure: COLONOSCOPY;  Surgeon: Jaynie Collins, DO;  Location: [facility];  Service: Gastroenterology;   Laterality: N/A;  DM   HERNIA REPAIR     x 2   LITHOTRIPSY     MENISCUS REPAIR      Current Medications: No outpatient medications have been marked as taking for the 09/25/23 encounter (Appointment) with Sondra Barges, PA-C.    Allergies:   Codeine   Social History   Socioeconomic History   Marital status: Married    Spouse name: Jeffrey [name]   Number of children: 3   Years of education: bachelors   Highest education level: Not on file  Occupational History    Employer: [employer]  Tobacco Use   Smoking status: Former    Current packs/day: 0.00    Types: Cigarettes    Quit date: 08/11/1984    Years since quitting: 39.1   Smokeless tobacco: Never  Vaping Use   Vaping status: Never Used  Substance and Sexual Activity   Alcohol use: No    Alcohol/week: 0.0 standard drinks of alcohol   Drug use: No   Sexual activity: Not on file  Other Topics Concern   Not on file  Social History Narrative   Mr. Parthasarathy has 3 biological children, and 2 adopted children.   Social Drivers of Corporate investment banker Strain: Low Risk  (07/19/2022)   Overall Financial Resource Strain (CARDIA)    Difficulty of Paying Living Expenses: Not hard at all  Food  Insecurity: No Food Insecurity (07/19/2022)   Hunger Vital Sign    Worried About Running Out of Food in the Last Year: Never true    Ran Out of Food in the Last Year: Never true  Transportation Needs: No Transportation Needs (07/19/2022)   PRAPARE - Administrator, Civil Service (Medical): No    Lack of Transportation (Non-Medical): No  Physical Activity: Insufficiently Active (05/08/2022)   Exercise Vital Sign    Days of Exercise per Week: 2 days    Minutes of Exercise per Session: 20 min  Stress: No Stress Concern Present (05/08/2022)   [employer] of Occupational Health - Occupational Stress Questionnaire    Feeling of Stress : Only a little  Social Connections: Moderately Integrated (07/19/2022)    Social Connection and Isolation Panel [NHANES]    Frequency of Communication with Friends and Family: More than three times a week    Frequency of Social Gatherings with Friends and Family: More than three times a week    Attends Religious Services: More than 4 times per year    Active Member of [employer] or Organizations: No    Attends Engineer, structural: Never    Marital Status: Married     Family History:  The patient's family history includes ALS in his father; Cancer in his maternal grandmother and paternal grandmother; Club foot in his son; Diabetes in his maternal grandfather, mother, and paternal grandfather; Drug abuse in his sister; Heart attack in his father and paternal grandfather; Heart disease in his brother and brother; Hypertension in his brother, brother, brother, father, and paternal grandfather.  ROS:   12-point review of systems is negative unless otherwise noted in the HPI.   EKGs/Labs/Other Studies Reviewed:    Studies reviewed were summarized above. The additional studies were reviewed today:  Lexiscan MPI 10/01/2019: Pharmacological myocardial perfusion imaging study with no significant  ischemia Normal wall motion, EF estimated at 36% (depressed likely secondary to GI uptake artifact) No EKG changes concerning for ischemia at peak stress or in recovery. Low risk scan   EKG:  EKG is ordered today.  The EKG ordered today demonstrates ***  Recent Labs: 04/20/2023: BUN 27; Creatinine, Ser 1.25; Hemoglobin 15.0; Platelets 228; Potassium 4.2; Sodium 129  Recent Lipid Panel    Component Value Date/Time   CHOL 161 06/19/2022 1338   TRIG 596 (HH) 06/19/2022 1338   HDL 29 (L) 06/19/2022 1338   CHOLHDL 5.6 (H) 06/19/2022 1338   CHOLHDL 20.3 07/19/2015 0504   VLDL UNABLE TO CALCULATE IF TRIGLYCERIDE OVER 400 mg/dL 16/05/9603 5409   LDLCALC 46 06/19/2022 1338    PHYSICAL EXAM:    VS:  There were no vitals taken for this visit.  BMI: There is no height or  weight on file to calculate BMI.  Physical Exam  Wt Readings from Last 3 Encounters:  04/20/23 200 lb 6.4 oz (90.9 kg)  11/11/22 200 lb 6.4 oz (90.9 kg)  10/28/22 199 lb 12.8 oz (90.6 kg)     ASSESSMENT & PLAN:   ***   {Are you ordering a CV Procedure (e.g. stress test, cath, DCCV, TEE, etc)?   Press F2        :[id number]}     Disposition: F/u with Dr. Mariah Milling or an APP in ***.   Medication Adjustments/Labs and Tests Ordered: Current medicines are reviewed at length with the patient today.  Concerns regarding medicines are outlined above. Medication changes, Labs and Tests ordered today are summarized  above and listed in the Patient Instructions accessible in Encounters.   Signed, Eula Listen, PA-C 09/24/2023 9:45 AM     [facility] [place] [address] [place], [place] [address] [phone]

## 2023-09-25 ENCOUNTER — Ambulatory Visit: Payer: Medicare Other | Admitting: Physician Assistant

## 2023-09-26 ENCOUNTER — Encounter: Payer: Self-pay | Admitting: Cardiology

## 2023-09-26 ENCOUNTER — Ambulatory Visit: Payer: Medicare Other | Attending: Cardiology | Admitting: Cardiology

## 2023-09-26 VITALS — BP 140/68 | HR 75 | Ht 66.0 in | Wt 190.6 lb

## 2023-09-26 DIAGNOSIS — E782 Mixed hyperlipidemia: Secondary | ICD-10-CM | POA: Diagnosis not present

## 2023-09-26 DIAGNOSIS — Z01810 Encounter for preprocedural cardiovascular examination: Secondary | ICD-10-CM | POA: Diagnosis not present

## 2023-09-26 DIAGNOSIS — I10 Essential (primary) hypertension: Secondary | ICD-10-CM

## 2023-09-26 DIAGNOSIS — R0602 Shortness of breath: Secondary | ICD-10-CM | POA: Diagnosis not present

## 2023-09-26 MED ORDER — ATORVASTATIN CALCIUM 80 MG PO TABS: 80.0000 mg | ORAL_TABLET | Freq: Every day | ORAL | 0 refills | Status: AC

## 2023-09-26 MED ORDER — ATORVASTATIN CALCIUM 80 MG PO TABS: 80.0000 mg | ORAL_TABLET | Freq: Every day | ORAL | 3 refills | Status: DC

## 2023-09-26 NOTE — Patient Instructions (Addendum)
Medication Instructions:  Your physician recommends the following medication changes.  STOP TAKING: Pravastatin  START TAKING: Atorvastatin 80 mg daily  *If you need a refill on your cardiac medications before your next appointment, please call your pharmacy*   Lab Work: Your provider would like for you to return in 3 months to have the following labs drawn: Lipid panel.   Please go to [facility] [address] ([facility] [address], [place] [address] You do not need an appointment.  They are open from 8 am- 4:30 pm.  Lunch from 1:00 pm- 2:00 pm You DO need to be fasting.   You may also go to one of the following [facility]:  [address] [place], [place] [address] Phone: [phone] Lab hours: Mon-Fri 8 am- 5 pm    Lunch 12 pm- 1 pm  [address] [place],  [place]  [address]  [place] Phone: [phone] Lab hours: 7 am- 4 pm Lunch 12 pm-1 pm   [address] [place],  [place]  [address]  [place] Phone: [phone] Lab hours: Mon-Fri 8 am- 5 pm    Lunch 12 pm- 1 pm  If you have labs (blood work) drawn today and your tests are completely normal, you will receive your results only by: MyChart Message (if you have MyChart) OR A paper copy in the mail If you have any lab test that is abnormal or we need to change your treatment, we will call you to review the results.   Testing/Procedures: Your physician has requested that you have an echocardiogram. Echocardiography is a painless test that uses sound waves to create images of your heart. It provides your doctor with information about the size and shape of your heart and how well your heart's chambers and valves are working.   You may receive an ultrasound enhancing agent through an IV if needed to better visualize your heart during the echo. This procedure takes approximately one hour.  There are no restrictions for this procedure.  This will take place at [address] [facility]) [address],  [place] [address]  Please note: We ask at that you not bring children with you during ultrasound (echo/ vascular) testing. Due to room size and safety concerns, children are not allowed in the ultrasound rooms during exams. Our front office staff cannot provide observation of children in our lobby area while testing is being conducted. An adult accompanying a patient to their appointment will only be allowed in the ultrasound room at the discretion of the ultrasound technician under special circumstances. We apologize for any inconvenience.   Follow-Up: At [facility], you and your health needs are our priority.  As part of our continuing mission to provide you with exceptional heart care, we have created designated Provider Care Teams.  These Care Teams include your primary Cardiologist (physician) and Advanced Practice Providers (APPs -  Physician Assistants and Nurse Practitioners) who all work together to provide you with the care you need, when you need it.    Your next appointment:   3 month(s)  Provider:   You may see Julien Nordmann, MD or one of the following Advanced Practice Providers on your designated Care Team:   Nicolasa Ducking, NP Eula Listen, PA-C Cadence Fransico Michael, PA-C Charlsie Quest, NP Carlos Levering, NP

## 2023-09-26 NOTE — Progress Notes (Signed)
Cardiology Office Note:    Date:  09/26/2023   ID:  Jeffrey [name], DOB 01-31-1956, MRN [id number]  PCP:  Bosie Clos, MD   [facility] Providers Cardiologist:  None     Referring MD: Bosie Clos, MD   Chief Complaint  Patient presents with   New Patient (Initial Visit)    Patient last seen in clinic in 2021 needing cardiac clearance for left Total Knee Arthroplasty    History of Present Illness:    Jeffrey [name] is a 68 y.o. male with a hx of hypertension, hyperlipidemia, diabetes presenting for preop evaluation.  Patient has left knee arthritis, total knee arthroplasty is being planned.  He denies chest pain, has very rare shortness of breath when he overexerts himself.  States being in chronic left knee pain which he rates as about 6 out of 10.  He is compliant with his BP medications as prescribed. pain might be contributing to elevated BP.  Previous cardiac workup includes Lexiscan Myoview 09/2019 which was low risk.   Past Medical History:  Diagnosis Date   Actinic keratosis    Arthritis    shoulder   Cholecystitis    Diabetes mellitus (HCC)    Type 2   Diverticulosis    Erectile dysfunction    Family history of adverse reaction to anesthesia    mother heart stopped during cardiac cath   GERD (gastroesophageal reflux disease)    History of kidney stones    Hyperlipidemia    Hypertension    Myalgia    Obesity    Peyronie's disease    Plantar fasciitis    Seasonal allergies    Vertigo    2 years ago    Past Surgical History:  Procedure Laterality Date   ARTHROSCOPIC REPAIR ACL     CATARACT EXTRACTION W/PHACO Left 10/28/2022   Procedure: CATARACT EXTRACTION PHACO AND INTRAOCULAR LENS PLACEMENT (IOC) LEFT DIABETIC  1.89  00:19.5;  Surgeon: Nevada Crane, MD;  Location: [facility];  Service: Ophthalmology;  Laterality: Left;   CATARACT EXTRACTION W/PHACO Right 11/11/2022   Procedure: CATARACT EXTRACTION PHACO AND  INTRAOCULAR LENS PLACEMENT (IOC) RIGHT DIABETIC  2.16  00:21.6;  Surgeon: Nevada Crane, MD;  Location: [facility];  Service: Ophthalmology;  Laterality: Right;   COLONOSCOPY N/A 02/25/2022   Procedure: COLONOSCOPY;  Surgeon: Jaynie Collins, DO;  Location: [facility];  Service: Gastroenterology;  Laterality: N/A;  DM   HERNIA REPAIR     x 2   LITHOTRIPSY     MENISCUS REPAIR      Current Medications: Current Meds  Medication Sig   albuterol (VENTOLIN HFA) 108 (90 Base) MCG/ACT inhaler Inhale 2 puffs into the lungs every 4 (four) hours as needed for wheezing or shortness of breath.   ALLERGY RELIEF 10 MG tablet TAKE 1 TABLET BY MOUTH  DAILY   amLODipine (NORVASC) 10 MG tablet TAKE 1 TABLET DAILY   aspirin 81 MG tablet Take 81 mg by mouth daily.   atorvastatin (LIPITOR) 80 MG tablet Take 1 tablet (80 mg total) by mouth daily.   atorvastatin (LIPITOR) 80 MG tablet Take 1 tablet (80 mg total) by mouth daily.   blood glucose meter kit and supplies Dispense based on patient and insurance preference. Use up to four times daily as directed. (FOR ICD-10 E10.9, E11.9).   budesonide-formoterol (SYMBICORT) 160-4.5 MCG/ACT inhaler Inhale 2 puffs into the lungs daily.   CONTOUR NEXT TEST test strip CHECK BLOOD  SUGAR UP TO FOUR TIMES A DAY AS DIRECTED   dapagliflozin propanediol (FARXIGA) 10 MG TABS tablet Take 10 mg by mouth daily.   Dulaglutide (TRULICITY) 1.5 MG/0.5ML SOPN INJECT 1.5MG  SUBCUTANEOUSLY ONCE A WEEK   fluticasone (FLONASE) 50 MCG/ACT nasal spray Place 2 sprays into both nostrils daily.   Fluticasone Propionate, Inhal, (FLOVENT DISKUS) 50 MCG/ACT AEPB Inhale 1 puff into the lungs in the morning and at bedtime.   glipiZIDE (GLUCOTROL) 10 MG tablet TAKE ONE-HALF (1/2) TABLET DAILY BEFORE BREAKFAST (Patient taking differently: Take 10 mg by mouth daily before breakfast.)   loratadine (CLARITIN) 10 MG tablet Take 1 tablet by mouth daily.   losartan (COZAAR) 100 MG  tablet TAKE 1 TABLET DAILY   meclizine (ANTIVERT) 25 MG tablet Take 25 mg by mouth 3 (three) times daily as needed for dizziness.   metFORMIN (GLUCOPHAGE) 1000 MG tablet TAKE 1 TABLET TWICE A DAY WITH MEALS   metFORMIN (GLUCOPHAGE) 1000 MG tablet Take 1 tablet (1,000 mg total) by mouth 2 (two) times daily with a meal.   Microlet Lancets MISC USE UP TO 4 TIMES DAILY AS  DIRECTED   omega-3 acid ethyl esters (LOVAZA) 1 g capsule TAKE 1 CAPSULE TWICE A DAY   omeprazole (PRILOSEC) 20 MG capsule TAKE 1 CAPSULE DAILY   omeprazole (PRILOSEC) 20 MG capsule Take 1 capsule by mouth daily.   sulfacetamide (BLEPH-10) 10 % ophthalmic solution Place 2 drops into both eyes 4 (four) times daily.   triamterene-hydrochlorothiazide (DYAZIDE) 37.5-25 MG capsule Take 1 each (1 capsule total) by mouth every morning.   [DISCONTINUED] pravastatin (PRAVACHOL) 40 MG tablet TAKE 1 TABLET DAILY AT 6 P.M.     Allergies:   Codeine   Social History   Socioeconomic History   Marital status: Married    Spouse name: Vikki Ports   Number of children: 3   Years of education: bachelors   Highest education level: Not on file  Occupational History    Employer: [employer]  Tobacco Use   Smoking status: Former    Current packs/day: 0.00    Types: Cigarettes    Quit date: 08/11/1984    Years since quitting: 39.1   Smokeless tobacco: Never  Vaping Use   Vaping status: Never Used  Substance and Sexual Activity   Alcohol use: No    Alcohol/week: 0.0 standard drinks of alcohol   Drug use: No   Sexual activity: Not on file  Other Topics Concern   Not on file  Social History Narrative   Mr. Vaden has 3 biological children, and 2 adopted children.   Social Drivers of Corporate investment banker Strain: Low Risk  (09/25/2023)   Received from [facility]   Overall Financial Resource Strain (CARDIA)    Difficulty of Paying Living Expenses: Not hard at all  Food Insecurity: No Food  Insecurity (09/25/2023)   [facility]   Hunger Vital Sign    Worried About Running Out of Food in the Last Year: Never true    Ran Out of Food in the Last Year: Never true  Transportation Needs: No Transportation Needs (09/25/2023)   Received from [facility] - Transportation    In the past 12 months, has lack of transportation kept you from medical appointments or from getting medications?: No    Lack of Transportation (Non-Medical): No  Physical Activity: Inactive (09/25/2023)   Received from [facility]  Exercise Vital Sign    Days of Exercise per Week: 0 days    Minutes of Exercise per Session: 0 min  Stress: No Stress Concern Present (05/08/2022)   [employer] of Occupational Health - Occupational Stress Questionnaire    Feeling of Stress : Only a little  Social Connections: Moderately Integrated (07/19/2022)   Social Connection and Isolation Panel [NHANES]    Frequency of Communication with Friends and Family: More than three times a week    Frequency of Social Gatherings with Friends and Family: More than three times a week    Attends Religious Services: More than 4 times per year    Active Member of [employer] or Organizations: No    Attends Engineer, structural: Never    Marital Status: Married     Family History: The patient's family history includes ALS in his father; Cancer in his maternal grandmother and paternal grandmother; Club foot in his son; Diabetes in his maternal grandfather, mother, and paternal grandfather; Drug abuse in his sister; Heart attack in his father and paternal grandfather; Heart disease in his brother and brother; Hypertension in his brother, brother, brother, father, and paternal grandfather.  ROS:   Please see the history of present illness.     All other systems reviewed and are negative.  EKGs/Labs/Other Studies Reviewed:    The following studies were  reviewed today:  EKG Interpretation Date/Time:  Friday September 26 2023 14:29:10 EST Ventricular Rate:  75 PR Interval:  190 QRS Duration:  148 QT Interval:  408 QTC Calculation: 455 R Axis:   263  Text Interpretation: Normal sinus rhythm Right bundle branch block Confirmed by Debbe Odea ([id number]) on 09/26/2023 2:40:06 PM    Recent Labs: 04/20/2023: BUN 27; Creatinine, Ser 1.25; Hemoglobin 15.0; Platelets 228; Potassium 4.2; Sodium 129  Recent Lipid Panel    Component Value Date/Time   CHOL 161 06/19/2022 1338   TRIG 596 (HH) 06/19/2022 1338   HDL 29 (L) 06/19/2022 1338   CHOLHDL 5.6 (H) 06/19/2022 1338   CHOLHDL 20.3 07/19/2015 0504   VLDL UNABLE TO CALCULATE IF TRIGLYCERIDE OVER 400 mg/dL 60/45/4098 1191   LDLCALC 46 06/19/2022 1338    Lipid panel 06/2023 total cholesterol 201, triglycerides 1357, HDL 29.   Risk Assessment/Calculations:            Physical Exam:    VS:  BP (!) 140/68 (BP Location: Left Arm, Patient Position: Sitting, Cuff Size: Normal)   Pulse 75   Ht 5\' 6"  (1.676 m)   Wt 190 lb 9.6 oz (86.5 kg)   SpO2 97%   BMI 30.76 kg/m     Wt Readings from Last 3 Encounters:  09/26/23 190 lb 9.6 oz (86.5 kg)  04/20/23 200 lb 6.4 oz (90.9 kg)  11/11/22 200 lb 6.4 oz (90.9 kg)     GEN:  Well nourished, well developed in no acute distress HEENT: Normal NECK: No JVD; No carotid bruits CARDIAC: RRR, no murmurs, rubs, gallops RESPIRATORY:  Clear to auscultation without rales, wheezing or rhonchi  ABDOMEN: Soft, non-tender, non-distended MUSCULOSKELETAL:  No edema; No deformity  SKIN: Warm and dry NEUROLOGIC:  Alert and oriented x 3 PSYCHIATRIC:  Normal affect   ASSESSMENT:    1. Pre-procedural cardiovascular examination   2. Primary hypertension   3. Mixed hyperlipidemia   4. Shortness of breath    PLAN:    In order of problems listed above:  Preop evaluation, denies chest pain, previous Lexiscan Myoview with no significant  ischemia.   Patient asymptomatic.  Okay for left knee surgery.  No additional testing indicated. Hypertension, BP slightly elevated, patient in chronic pain.  Continue losartan 100 mg daily, Norvasc 10 mg daily, Dyazide.  Consider adding Coreg if BP stays elevated after knee surgery. Hyperlipidemia, significantly elevated triglycerides.  Stop Pravachol, start Lipitor 80 mg daily.  Obtain lipid panel in 3 months. Nonspecific shortness of breath, obtain echo.  This should not delay surgery.  Follow-up after echo and repeat lipid panel.       Medication Adjustments/Labs and Tests Ordered: Current medicines are reviewed at length with the patient today.  Concerns regarding medicines are outlined above.  Orders Placed This Encounter  Procedures   Lipid panel   EKG 12-Lead   ECHOCARDIOGRAM COMPLETE   Meds ordered this encounter  Medications   atorvastatin (LIPITOR) 80 MG tablet    Sig: Take 1 tablet (80 mg total) by mouth daily.    Dispense:  90 tablet    Refill:  3   atorvastatin (LIPITOR) 80 MG tablet    Sig: Take 1 tablet (80 mg total) by mouth daily.    Dispense:  30 tablet    Refill:  0    Patient Instructions  Medication Instructions:  Your physician recommends the following medication changes.  STOP TAKING: Pravastatin  START TAKING: Atorvastatin 80 mg daily  *If you need a refill on your cardiac medications before your next appointment, please call your pharmacy*   Lab Work: Your provider would like for you to return in 3 months to have the following labs drawn: Lipid panel.   Please go to [facility] [address] ([facility]) [address], [place] [address] You do not need an appointment.  They are open from 8 am- 4:30 pm.  Lunch from 1:00 pm- 2:00 pm You DO need to be fasting.   You may also go to one of the following [facility]:  [address] [place], [place] [address] Phone: [phone] Lab hours: Mon-Fri 8 am- 5 pm    Lunch 12 pm- 1 pm  [address] [place],  [place]  [address]  [place] Phone: [phone] Lab hours: 7 am- 4 pm Lunch 12 pm-1 pm   [address] [place],  [place]  [address]  [place] Phone: [phone] Lab hours: Mon-Fri 8 am- 5 pm    Lunch 12 pm- 1 pm  If you have labs (blood work) drawn today and your tests are completely normal, you will receive your results only by: MyChart Message (if you have MyChart) OR A paper copy in the mail If you have any lab test that is abnormal or we need to change your treatment, we will call you to review the results.   Testing/Procedures: Your physician has requested that you have an echocardiogram. Echocardiography is a painless test that uses sound waves to create images of your heart. It provides your doctor with information about the size and shape of your heart and how well your heart's chambers and valves are working.   You may receive an ultrasound enhancing agent through an IV if needed to better visualize your heart during the echo. This procedure takes approximately one hour.  There are no restrictions for this procedure.  This will take place at [address] [facility]) [address], [place] [address]  Please note: We ask at that you not bring children with you during ultrasound (echo/ vascular) testing. Due to room size and safety concerns, children are not  allowed in the ultrasound rooms during exams. Our front office staff cannot provide observation of children in our lobby area while testing is being conducted. An adult accompanying a patient to their appointment will only be allowed in the ultrasound room at the discretion of the ultrasound technician under special circumstances. We apologize for any inconvenience.   Follow-Up: At [facility], you and your health needs are our priority.  As part of our continuing mission to provide you with exceptional heart care, we have created designated Provider Care Teams.  These Care Teams include your primary  Cardiologist (physician) and Advanced Practice Providers (APPs -  Physician Assistants and Nurse Practitioners) who all work together to provide you with the care you need, when you need it.    Your next appointment:   3 month(s)  Provider:   You may see Julien Nordmann, MD or one of the following Advanced Practice Providers on your designated Care Team:   Nicolasa Ducking, NP Eula Listen, PA-C Cadence Fransico Michael, PA-C Charlsie Quest, NP Carlos Levering, NP      Signed, Debbe Odea, MD  09/26/2023 4:01 PM    [facility]

## 2023-10-20 ENCOUNTER — Other Ambulatory Visit: Payer: Self-pay

## 2023-10-20 MED ORDER — ATORVASTATIN CALCIUM 80 MG PO TABS: 80.0000 mg | ORAL_TABLET | Freq: Every day | ORAL | 3 refills | Status: DC

## 2023-12-22 ENCOUNTER — Ambulatory Visit: Payer: Medicare Other | Attending: Cardiology

## 2023-12-22 DIAGNOSIS — R0602 Shortness of breath: Secondary | ICD-10-CM

## 2023-12-22 LAB — ECHOCARDIOGRAM COMPLETE
AR max vel: 2.75 cm2
AV Area VTI: 3.01 cm2
AV Area mean vel: 2.76 cm2
AV Mean grad: 4 mmHg
AV Peak grad: 7.6 mmHg
Ao pk vel: 1.38 m/s
Area-P 1/2: 3.12 cm2
S' Lateral: 2.54 cm

## 2023-12-23 ENCOUNTER — Ambulatory Visit (INDEPENDENT_AMBULATORY_CARE_PROVIDER_SITE_OTHER): Payer: Medicare Other | Admitting: Cardiology

## 2023-12-23 ENCOUNTER — Ambulatory Visit: Payer: Medicare Other | Admitting: Cardiovascular Disease

## 2023-12-23 ENCOUNTER — Encounter: Payer: Self-pay | Admitting: Cardiology

## 2023-12-23 VITALS — BP 142/70 | HR 90 | Ht 66.0 in | Wt 186.8 lb

## 2023-12-23 DIAGNOSIS — E782 Mixed hyperlipidemia: Secondary | ICD-10-CM | POA: Diagnosis not present

## 2023-12-23 DIAGNOSIS — I10 Essential (primary) hypertension: Secondary | ICD-10-CM | POA: Diagnosis not present

## 2023-12-23 LAB — LIPID PANEL
Chol/HDL Ratio: 4.4 ratio (ref 0.0–5.0)
Cholesterol, Total: 132 mg/dL (ref 100–199)
HDL: 30 mg/dL — ABNORMAL LOW (ref >39)
LDL Chol Calc (NIH): 44 mg/dL (ref 0–99)
Triglycerides: 396 mg/dL — ABNORMAL HIGH (ref 0–149)
VLDL Cholesterol Cal: 58 mg/dL — ABNORMAL HIGH (ref 5–40)

## 2023-12-23 MED ORDER — OMEGA-3-ACID ETHYL ESTERS 1 G PO CAPS: 2.0000 g | ORAL_CAPSULE | Freq: Two times a day (BID) | ORAL | 3 refills | Status: AC

## 2023-12-23 NOTE — Progress Notes (Signed)
 Cardiology Office Note:    Date:  12/23/2023   ID:  [name], [name] 1955-08-17, MRN [id number]  PCP:  Jeffrey Barters, MD   [facility] Providers Cardiologist:  None     Referring MD: Jeffrey Barters, MD   Chief Complaint  Patient presents with   Follow-up    3 month follow up visit. Patient is doing well on today. Meds reviewed .    History of Present Illness:    Jeffrey [name] is a 69 y.o. male with a hx of hypertension, hyperlipidemia, diabetes presenting for follow-up.    Previously seen for preop evaluation prior to knee surgery.  Underwent left knee surgery successfully.  Currently recovering, undergoing physical therapy.  Still has some left knee pain.  BP slightly elevated, not sure if this is secondary to his pain.  Cholesterol was obtained yesterday showing elevated triglycerides.  He currently takes Lovaza  1 g twice daily, atorvastatin  80 daily.  Prior notes/testing Echo 12/22/2023 EF 60 to 65% Lexiscan  Myoview  09/2019 which was low risk.   Past Medical History:  Diagnosis Date   Actinic keratosis    Arthritis    shoulder   Cholecystitis    Diabetes mellitus (HCC)    Type 2   Diverticulosis    Erectile dysfunction    Family history of adverse reaction to anesthesia    mother heart stopped during cardiac cath   GERD (gastroesophageal reflux disease)    History of kidney stones    Hyperlipidemia    Hypertension    Myalgia    Obesity    Peyronie's disease    Plantar fasciitis    Seasonal allergies    Vertigo    2 years ago    Past Surgical History:  Procedure Laterality Date   ARTHROSCOPIC REPAIR ACL     CATARACT EXTRACTION W/PHACO Left 10/28/2022   Procedure: CATARACT EXTRACTION PHACO AND INTRAOCULAR LENS PLACEMENT (IOC) LEFT DIABETIC  1.89  00:19.5;  Surgeon: Rosa College, MD;  Location: [facility];  Service: Ophthalmology;  Laterality: Left;   CATARACT EXTRACTION W/PHACO Right 11/11/2022   Procedure:  CATARACT EXTRACTION PHACO AND INTRAOCULAR LENS PLACEMENT (IOC) RIGHT DIABETIC  2.16  00:21.6;  Surgeon: Rosa College, MD;  Location: [facility];  Service: Ophthalmology;  Laterality: Right;   COLONOSCOPY N/A 02/25/2022   Procedure: COLONOSCOPY;  Surgeon: Quintin Buckle, DO;  Location: [facility];  Service: Gastroenterology;  Laterality: N/A;  DM   HERNIA REPAIR     x 2   LITHOTRIPSY     MENISCUS REPAIR      Current Medications: Current Meds  Medication Sig   albuterol  (VENTOLIN  HFA) 108 (90 Base) MCG/ACT inhaler Inhale 2 puffs into the lungs every 4 (four) hours as needed for wheezing or shortness of breath.   ALLERGY RELIEF 10 MG tablet TAKE 1 TABLET BY MOUTH  DAILY   amLODipine  (NORVASC ) 10 MG tablet TAKE 1 TABLET DAILY   aspirin  81 MG tablet Take 81 mg by mouth daily.   atorvastatin  (LIPITOR) 80 MG tablet Take 1 tablet (80 mg total) by mouth daily.   blood glucose meter kit and supplies Dispense based on patient and insurance preference. Use up to four times daily as directed. (FOR ICD-10 E10.9, E11.9).   budesonide-formoterol (SYMBICORT) 160-4.5 MCG/ACT inhaler Inhale 2 puffs into the lungs daily.   CONTOUR NEXT TEST test strip CHECK BLOOD SUGAR UP TO FOUR TIMES A DAY AS DIRECTED   dapagliflozin propanediol (FARXIGA)  10 MG TABS tablet Take 10 mg by mouth daily.   doxycycline  (VIBRAMYCIN ) 100 MG capsule Take 1 capsule (100 mg total) by mouth 2 (two) times daily.   Dulaglutide  (TRULICITY ) 1.5 MG/0.5ML SOPN INJECT 1.5MG  SUBCUTANEOUSLY ONCE A WEEK   fluticasone  (FLONASE ) 50 MCG/ACT nasal spray Place 2 sprays into both nostrils daily.   Fluticasone  Propionate, Inhal, (FLOVENT  DISKUS) 50 MCG/ACT AEPB Inhale 1 puff into the lungs in the morning and at bedtime.   glipiZIDE  (GLUCOTROL ) 10 MG tablet TAKE ONE-HALF (1/2) TABLET DAILY BEFORE BREAKFAST (Patient taking differently: Take 10 mg by mouth daily before breakfast.)   losartan  (COZAAR ) 100 MG tablet TAKE 1  TABLET DAILY   meclizine  (ANTIVERT ) 25 MG tablet Take 25 mg by mouth 3 (three) times daily as needed for dizziness.   metFORMIN  (GLUCOPHAGE ) 1000 MG tablet Take 1 tablet (1,000 mg total) by mouth 2 (two) times daily with a meal.   Microlet Lancets MISC USE UP TO 4 TIMES DAILY AS  DIRECTED   omeprazole  (PRILOSEC) 20 MG capsule Take 1 capsule by mouth daily.   sulfacetamide  (BLEPH -10) 10 % ophthalmic solution Place 2 drops into both eyes 4 (four) times daily.   triamterene -hydrochlorothiazide (DYAZIDE) 37.5-25 MG capsule Take 1 each (1 capsule total) by mouth every morning.   [DISCONTINUED] omega-3 acid ethyl esters (LOVAZA ) 1 g capsule TAKE 1 CAPSULE TWICE A DAY     Allergies:   Codeine   Social History   Socioeconomic History   Marital status: Married    Spouse name: Tarri Farm   Number of children: 3   Years of education: bachelors   Highest education level: Not on file  Occupational History    Employer: [employer]  Tobacco Use   Smoking status: Former    Current packs/day: 0.00    Types: Cigarettes    Quit date: 08/11/1984    Years since quitting: 39.3   Smokeless tobacco: Never  Vaping Use   Vaping status: Never Used  Substance and Sexual Activity   Alcohol use: No    Alcohol/week: 0.0 standard drinks of alcohol   Drug use: No   Sexual activity: Not on file  Other Topics Concern   Not on file  Social History Narrative   [name] has 3 biological children, and 2 adopted children.   Social Drivers of Corporate investment banker Strain: Low Risk  (09/25/2023)   Received from [facility]   Overall Financial Resource Strain (CARDIA)    Difficulty of Paying Living Expenses: Not hard at all  Food Insecurity: No Food Insecurity (09/25/2023)   Received from [facility]   Hunger Vital Sign    Worried About Running Out of Food in the Last Year: Never true    Ran Out of Food in the Last Year: Never true  Transportation  Needs: No Transportation Needs (09/25/2023)   Received from [facility] - Transportation    In the past 12 months, has lack of transportation kept you from medical appointments or from getting medications?: No    Lack of Transportation (Non-Medical): No  Physical Activity: Inactive (09/25/2023)   Received from [facility]   Exercise Vital Sign    Days of Exercise per Week: 0 days    Minutes of Exercise per Session: 0 min  Stress: No Stress Concern Present (05/08/2022)   [employer] of Occupational Health - Occupational Stress Questionnaire    Feeling of Stress :  Only a little  Social Connections: Moderately Integrated (07/19/2022)   Social Connection and Isolation Panel [NHANES]    Frequency of Communication with Friends and Family: More than three times a week    Frequency of Social Gatherings with Friends and Family: More than three times a week    Attends Religious Services: More than 4 times per year    Active Member of [employer] or Organizations: No    Attends Engineer, structural: Never    Marital Status: Married     Family History: The patient's family history includes ALS in his father; Cancer in his maternal grandmother and paternal grandmother; Club foot in his son; Diabetes in his maternal grandfather, mother, and paternal grandfather; Drug abuse in his sister; Heart attack in his father and paternal grandfather; Heart disease in his brother and brother; Hypertension in his brother, brother, brother, father, and paternal grandfather.  ROS:   Please see the history of present illness.     All other systems reviewed and are negative.  EKGs/Labs/Other Studies Reviewed:    The following studies were reviewed today:       Recent Labs: 04/20/2023: BUN 27; Creatinine, Ser 1.25; Hemoglobin 15.0; Platelets 228; Potassium 4.2; Sodium 129  Recent Lipid Panel    Component Value Date/Time   CHOL 132 12/22/2023 0808   TRIG 396  (H) 12/22/2023 0808   HDL 30 (L) 12/22/2023 0808   CHOLHDL 4.4 12/22/2023 0808   CHOLHDL 20.3 07/19/2015 0504   VLDL UNABLE TO CALCULATE IF TRIGLYCERIDE OVER 400 mg/dL 56/43/3295 1884   LDLCALC 44 12/22/2023 0808    Lipid panel 06/2023 total cholesterol 201, triglycerides 1357, HDL 29.   Risk Assessment/Calculations:            Physical Exam:    VS:  BP (!) 142/70   Pulse 90   Ht 5\' 6"  (1.676 m)   Wt 186 lb 12.8 oz (84.7 kg)   SpO2 97%   BMI 30.15 kg/m     Wt Readings from Last 3 Encounters:  12/23/23 186 lb 12.8 oz (84.7 kg)  09/26/23 190 lb 9.6 oz (86.5 kg)  04/20/23 200 lb 6.4 oz (90.9 kg)     GEN:  Well nourished, well developed in no acute distress HEENT: Normal NECK: No JVD; No carotid bruits CARDIAC: RRR, no murmurs, rubs, gallops RESPIRATORY:  Clear to auscultation without rales, wheezing or rhonchi  ABDOMEN: Soft, non-tender, non-distended MUSCULOSKELETAL:  No edema; mild left knee tenderness SKIN: Warm and dry NEUROLOGIC:  Alert and oriented x 3 PSYCHIATRIC:  Normal affect   ASSESSMENT:    1. Primary hypertension   2. Mixed hyperlipidemia    PLAN:    In order of problems listed above:  Hypertension, BP slightly elevated, patient still with left knee pain after surgery.  Continue losartan  100 mg daily, Norvasc  10 mg daily, Dyazide.  Monitor BP at home and keep log, low-salt diet emphasized.  If BP elevated after knee surgery recovery, consider adding Coreg. Hyperlipidemia, overall improved, triglycerides still elevated.  Increase Lovaza  to 2 g twice daily, continue Lipitor 80 mg daily.  Obtain lipid panel in 4 months.  Follow-up in 4 to 6 months     Medication Adjustments/Labs and Tests Ordered: Current medicines are reviewed at length with the patient today.  Concerns regarding medicines are outlined above.  Orders Placed This Encounter  Procedures   Lipid panel   Meds ordered this encounter  Medications   omega-3 acid ethyl esters (LOVAZA )  1  g capsule    Sig: Take 2 capsules (2 g total) by mouth 2 (two) times daily.    Dispense:  180 capsule    Refill:  3    Patient Instructions  Medication Instructions:  Your physician recommends the following medication changes.  INCREASE: Lovaza  to 2 g twice a day  *If you need a refill on your cardiac medications before your next appointment, please call your pharmacy*  Lab Work: Your provider would like for you to return in 5 MONTHS or around 05/24/2024 to have the following labs drawn: fasting lipid panel.   Please go to [facility] [address] ([facility] [address], [place] [address] You do not need an appointment.  They are open from 8 am- 4:30 pm.  Lunch from 1:00 pm- 2:00 pm You WILL need to be fasting.  Testing/Procedures: No test ordered today   Follow-Up: At [facility], you and your health needs are our priority.  As part of our continuing mission to provide you with exceptional heart care, our providers are all part of one team.  This team includes your primary Cardiologist (physician) and Advanced Practice Providers or APPs (Physician Assistants and Nurse Practitioners) who all work together to provide you with the care you need, when you need it.  Your next appointment:   6 month(s)  Provider:   You may see Dr. Junnie Olives or one of the following Advanced Practice Providers on your designated Care Team:   Laneta Pintos, NP Gildardo Labrador, PA-C Varney Gentleman, PA-C Cadence [place], PA-C Ronald Cockayne, NP Morey Ar, NP    We recommend signing up for the patient portal called "MyChart".  Sign up information is provided on this After Visit Summary.  MyChart is used to connect with patients for Virtual Visits (Telemedicine).  Patients are able to view lab/test results, encounter notes, upcoming appointments, etc.  Non-urgent messages can be sent to your provider as well.   To learn more about what you can do with MyChart, go  to [email or web address].           Signed, Constancia Delton, MD  12/23/2023 9:23 AM    [facility]

## 2023-12-23 NOTE — Patient Instructions (Signed)
 Medication Instructions:  Your physician recommends the following medication changes.  INCREASE: Lovaza  to 2 g twice a day  *If you need a refill on your cardiac medications before your next appointment, please call your pharmacy*  Lab Work: Your provider would like for you to return in 5 MONTHS or around 05/24/2024 to have the following labs drawn: fasting lipid panel.   Please go to [facility] [address] ([facility] [address], [place] [address] You do not need an appointment.  They are open from 8 am- 4:30 pm.  Lunch from 1:00 pm- 2:00 pm You WILL need to be fasting.  Testing/Procedures: No test ordered today   Follow-Up: At [facility], you and your health needs are our priority.  As part of our continuing mission to provide you with exceptional heart care, our providers are all part of one team.  This team includes your primary Cardiologist (physician) and Advanced Practice Providers or APPs (Physician Assistants and Nurse Practitioners) who all work together to provide you with the care you need, when you need it.  Your next appointment:   6 month(s)  Provider:   You may see Dr. Junnie Olives or one of the following Advanced Practice Providers on your designated Care Team:   Laneta Pintos, NP Gildardo Labrador, PA-C Varney Gentleman, PA-C Cadence [place], PA-C Ronald Cockayne, NP Morey Ar, NP    We recommend signing up for the patient portal called "MyChart".  Sign up information is provided on this After Visit Summary.  MyChart is used to connect with patients for Virtual Visits (Telemedicine).  Patients are able to view lab/test results, encounter notes, upcoming appointments, etc.  Non-urgent messages can be sent to your provider as well.   To learn more about what you can do with MyChart, go to [email or web address].

## 2024-01-07 ENCOUNTER — Other Ambulatory Visit

## 2024-01-07 ENCOUNTER — Other Ambulatory Visit
Admission: RE | Admit: 2024-01-07 | Discharge: 2024-01-07 | Disposition: A | Discharge status: Home / Self Care | Payer: Self-pay | Source: Ambulatory Visit | Attending: Oncology | Admitting: Oncology

## 2024-01-07 DIAGNOSIS — Z006 Encounter for examination for normal comparison and control in clinical research program: Secondary | ICD-10-CM | POA: Insufficient documentation

## 2024-01-16 LAB — GENECONNECT MOLECULAR SCREEN: Genetic Analysis Overall Interpretation: NEGATIVE

## 2024-07-16 ENCOUNTER — Encounter: Payer: Self-pay | Admitting: Cardiology

## 2024-07-16 ENCOUNTER — Ambulatory Visit: Attending: Cardiology | Admitting: Cardiology

## 2024-07-16 VITALS — BP 122/60 | HR 64 | Ht 66.0 in | Wt 197.4 lb

## 2024-07-16 DIAGNOSIS — E1159 Type 2 diabetes mellitus with other circulatory complications: Secondary | ICD-10-CM

## 2024-07-16 DIAGNOSIS — I152 Hypertension secondary to endocrine disorders: Secondary | ICD-10-CM | POA: Diagnosis not present

## 2024-07-16 DIAGNOSIS — E782 Mixed hyperlipidemia: Secondary | ICD-10-CM

## 2024-07-16 NOTE — Patient Instructions (Signed)
 Medication Instructions:  Your physician recommends that you continue on your current medications as directed. Please refer to the Current Medication list given to you today.   *If you need a refill on your cardiac medications before your next appointment, please call your pharmacy*  Lab Work: Your provider would like for you to return in NEXT WEEK to have the following labs drawn: FASTING LIPID PANEL.   Please go to [facility] [address] ([facility], [place] [address] You do not need an appointment.  They are open from 8 am- 4:30 pm.  Lunch from 1:00 pm- 2:00 pm You DO need to be fasting.    If you have labs (blood work) drawn today and your tests are completely normal, you will receive your results only by: MyChart Message (if you have MyChart) OR A paper copy in the mail If you have any lab test that is abnormal or we need to change your treatment, we will call you to review the results.  Testing/Procedures: No test ordered today   Follow-Up: At [facility], you and your health needs are our priority.  As part of our continuing mission to provide you with exceptional heart care, our providers are all part of one team.  This team includes your primary Cardiologist (physician) and Advanced Practice Providers or APPs (Physician Assistants and Nurse Practitioners) who all work together to provide you with the care you need, when you need it.  Your next appointment:   6 month(s)  Provider:   You may see Dr Darliss or one of the following Advanced Practice Providers on your designated Care Team:   Lonni Meager, NP Lesley Maffucci, PA-C Bernardino Bring, PA-C Cadence [place], PA-C Tylene Lunch, NP Barnie Hila, NP    We recommend signing up for the patient portal called MyChart.  Sign up information is provided on this After Visit Summary.  MyChart is used to connect with patients for Virtual Visits (Telemedicine).  Patients are  able to view lab/test results, encounter notes, upcoming appointments, etc.  Non-urgent messages can be sent to your provider as well.   To learn more about what you can do with MyChart, go to [email or web address].

## 2024-07-16 NOTE — Progress Notes (Signed)
 Cardiology Office Note:    Date:  07/16/2024   ID:  Jeffrey [name], Jeffrey [name] [birth year], MRN [id number]  PCP:  Jeffrey Charlie CROME, MD   [name] Providers Cardiologist:  None     Referring MD: Jeffrey Charlie CROME, MD   No chief complaint on file.   History of Present Illness:    Jeffrey [name] is a 68 y.o. male with a hx of hypertension, hyperlipidemia, diabetes presenting for follow-up.    Doing okay, compliant with medications as prescribed.  Denies chest pain or shortness of breath.  BP adequately controlled on current meds.  Lovaza  previously increased to 2g twice dialy due to elevated triglycerides.   Prior notes/testing Echo 12/22/2023 EF 60 to 65% Lexiscan  Myoview  09/2019 which was low risk.   Past Medical History:  Diagnosis Date   Actinic keratosis    Arthritis    shoulder   Cholecystitis    Diabetes mellitus (HCC)    Type 2   Diverticulosis    Erectile dysfunction    Family history of adverse reaction to anesthesia    mother heart stopped during cardiac cath   GERD (gastroesophageal reflux disease)    History of kidney stones    Hyperlipidemia    Hypertension    Myalgia    Obesity    Peyronie's disease    Plantar fasciitis    Seasonal allergies    Vertigo    2 years ago    Past Surgical History:  Procedure Laterality Date   ARTHROSCOPIC REPAIR ACL     CATARACT EXTRACTION W/PHACO Left 10/28/2022   Procedure: CATARACT EXTRACTION PHACO AND INTRAOCULAR LENS PLACEMENT (IOC) LEFT DIABETIC  1.89  00:19.5;  Surgeon: Jeffrey Adine Anes, MD;  Location: [name];  Service: Ophthalmology;  Laterality: Left;   CATARACT EXTRACTION W/PHACO Right 11/11/2022   Procedure: CATARACT EXTRACTION PHACO AND INTRAOCULAR LENS PLACEMENT (IOC) RIGHT DIABETIC  2.16  00:21.6;  Surgeon: Jeffrey Adine Anes, MD;  Location: [name];  Service: Ophthalmology;  Laterality: Right;   COLONOSCOPY N/A 02/25/2022   Procedure: COLONOSCOPY;  Surgeon: Jeffrey Elspeth Sharper, DO;  Location: [facility];  Service: Gastroenterology;  Laterality: N/A;  DM   HERNIA REPAIR     x 2   LITHOTRIPSY     MENISCUS REPAIR      Current Medications: Current Meds  Medication Sig   albuterol  (VENTOLIN  HFA) 108 (90 Base) MCG/ACT inhaler Inhale 2 puffs into the lungs every 4 (four) hours as needed for wheezing or shortness of breath.   amLODipine  (NORVASC ) 10 MG tablet TAKE 1 TABLET DAILY   aspirin  81 MG tablet Take 81 mg by mouth daily.   atorvastatin  (LIPITOR) 80 MG tablet Take 1 tablet (80 mg total) by mouth daily.   celecoxib (CELEBREX) 200 MG capsule Take 1 tablet by mouth daily.   dapagliflozin propanediol (FARXIGA) 10 MG TABS tablet Take 10 mg by mouth daily.   Dulaglutide  (TRULICITY ) 1.5 MG/0.5ML SOPN INJECT 1.5MG  SUBCUTANEOUSLY ONCE A WEEK   fluticasone  (FLONASE ) 50 MCG/ACT nasal spray Place 2 sprays into both nostrils daily.   Fluticasone  Propionate, Inhal, (FLOVENT  DISKUS) 50 MCG/ACT AEPB Inhale 1 puff into the lungs in the morning and at bedtime.   losartan  (COZAAR ) 100 MG tablet TAKE 1 TABLET DAILY   meclizine  (ANTIVERT ) 25 MG tablet Take 25 mg by mouth 3 (three) times daily as needed for dizziness.   metFORMIN  (GLUCOPHAGE ) 1000 MG tablet Take 1 tablet (1,000 mg total) by mouth 2 (  two) times daily with a meal.   omega-3 acid ethyl esters (LOVAZA ) 1 g capsule Take 2 capsules (2 g total) by mouth 2 (two) times daily.   omeprazole  (PRILOSEC) 20 MG capsule TAKE 1 CAPSULE DAILY   triamterene -hydrochlorothiazide (DYAZIDE) 37.5-25 MG capsule Take 1 each (1 capsule total) by mouth every morning.     Allergies:   Codeine   Social History   Socioeconomic History   Marital status: Married    Spouse name: Jeffrey [name]   Number of children: 3   Years of education: bachelors   Highest education level: Not on file  Occupational History    Employer: [employer]  Tobacco Use   Smoking status: Former    Current packs/day: 0.00     Types: Cigarettes    Quit date: 08/11/1984    Years since quitting: 39.9   Smokeless tobacco: Never  Vaping Use   Vaping status: Never Used  Substance and Sexual Activity   Alcohol use: No    Alcohol/week: 0.0 standard drinks of alcohol   Drug use: No   Sexual activity: Not on file  Other Topics Concern   Not on file  Social History Narrative   Jeffrey [name] has 3 biological children, and 2 adopted children.   Social Drivers of Corporate Investment Banker Strain: Low Risk  (09/25/2023)   Received from [name]   Overall Financial Resource Strain (CARDIA)    Difficulty of Paying Living Expenses: Not hard at all  Food Insecurity: No Food Insecurity (09/25/2023)   Received from [name]   Hunger Vital Sign    Within the past 12 months, you worried that your food would run out before you got the money to buy more.: Never true    Within the past 12 months, the food you bought just didn't last and you didn't have money to get more.: Never true  Transportation Needs: No Transportation Needs (09/25/2023)   Received from [facility]    In the past 12 months, has lack of transportation kept you from medical appointments or from getting medications?: No    Lack of Transportation (Non-Medical): No  Physical Activity: Inactive (09/25/2023)   Received from [name]   Exercise Vital Sign    On average, how many days per week do you engage in moderate to strenuous exercise (like a brisk walk)?: 0 days    On average, how many minutes do you engage in exercise at this level?: 0 min  Stress: No Stress Concern Present (05/08/2022)   [employer] of Occupational Health - Occupational Stress Questionnaire    Feeling of Stress : Only a little  Social Connections: Moderately Integrated (07/19/2022)   Social Connection and Isolation Panel    Frequency of Communication with Friends and Family: More  than three times a week    Frequency of Social Gatherings with Friends and Family: More than three times a week    Attends Religious Services: More than 4 times per year    Active Member of [employer] or Organizations: No    Attends Engineer, Structural: Never    Marital Status: Married     Family History: The patient's family history includes ALS in his father; Cancer in his maternal grandmother and paternal grandmother; Club foot in his son; Diabetes in his maternal grandfather, mother, and paternal grandfather; Drug abuse in his sister; Heart attack in his father and paternal  grandfather; Heart disease in his brother and brother; Hypertension in his brother, brother, brother, father, and paternal grandfather.  ROS:   Please see the history of present illness.     All other systems reviewed and are negative.  EKGs/Labs/Other Studies Reviewed:    The following studies were reviewed today:  EKG Interpretation Date/Time:  Friday July 16 2024 13:42:38 EST Ventricular Rate:  64 PR Interval:  180 QRS Duration:  150 QT Interval:  442 QTC Calculation: 455 R Axis:   269  Text Interpretation: Normal sinus rhythm Right bundle branch block Confirmed by Darliss Rogue ([id number]) on 07/16/2024 1:57:10 PM    Recent Labs: No results found for requested labs within last 365 days.  Recent Lipid Panel    Component Value Date/Time   CHOL 132 12/22/2023 0808   TRIG 396 (H) 12/22/2023 0808   HDL 30 (L) 12/22/2023 0808   CHOLHDL 4.4 12/22/2023 0808   CHOLHDL 20.3 07/19/2015 0504   VLDL UNABLE TO CALCULATE IF TRIGLYCERIDE OVER 400 mg/dL 87/92/7983 9495   LDLCALC 44 12/22/2023 0808    Lipid panel 06/2023 total cholesterol 201, triglycerides 1357, HDL 29.   Risk Assessment/Calculations:            Physical Exam:    VS:  BP 122/60 (BP Location: Left Arm, Patient Position: Sitting, Cuff Size: Normal)   Pulse 64   Ht 5' 6 (1.676 m)   Wt 197 lb 6.4 oz (89.5 kg)   SpO2 98%    BMI 31.86 kg/m     Wt Readings from Last 3 Encounters:  07/16/24 197 lb 6.4 oz (89.5 kg)  12/23/23 186 lb 12.8 oz (84.7 kg)  09/26/23 190 lb 9.6 oz (86.5 kg)     GEN:  Well nourished, well developed in no acute distress HEENT: Normal NECK: No JVD; No carotid bruits CARDIAC: RRR, no murmurs, rubs, gallops RESPIRATORY:  Clear to auscultation without rales, wheezing or rhonchi  ABDOMEN: Soft, non-tender, non-distended MUSCULOSKELETAL:  No edema; mild left knee tenderness SKIN: Warm and dry NEUROLOGIC:  Alert and oriented x 3 PSYCHIATRIC:  Normal affect   ASSESSMENT:    1. Hypertension associated with diabetes (HCC)   2. Mixed hyperlipidemia    PLAN:    In order of problems listed above:  Hypertension, BP controlled. Continue losartan  100 mg daily, Norvasc  10 mg daily, Dyazide.   Hyperlipidemia, overall improved, continue Lovaza  2 g twice daily, continue Lipitor 80 mg daily.  Obtain fasting lipid profile.  Follow-up in 6 months     Medication Adjustments/Labs and Tests Ordered: Current medicines are reviewed at length with the patient today.  Concerns regarding medicines are outlined above.  Orders Placed This Encounter  Procedures   Lipid panel   EKG 12-Lead   No orders of the defined types were placed in this encounter.   Patient Instructions  Medication Instructions:  Your physician recommends that you continue on your current medications as directed. Please refer to the Current Medication list given to you today.   *If you need a refill on your cardiac medications before your next appointment, please call your pharmacy*  Lab Work: Your provider would like for you to return in NEXT WEEK to have the following labs drawn: FASTING LIPID PANEL.   Please go to [name] [address] ([facility], [place] [address] You do not need an appointment.  They are open from 8 am- 4:30 pm.  Lunch from 1:00 pm- 2:00 pm You DO need to  be  fasting.    If you have labs (blood work) drawn today and your tests are completely normal, you will receive your results only by: MyChart Message (if you have MyChart) OR A paper copy in the mail If you have any lab test that is abnormal or we need to change your treatment, we will call you to review the results.  Testing/Procedures: No test ordered today   Follow-Up: At [name], you and your health needs are our priority.  As part of our continuing mission to provide you with exceptional heart care, our providers are all part of one team.  This team includes your primary Cardiologist (physician) and Advanced Practice Providers or APPs (Physician Assistants and Nurse Practitioners) who all work together to provide you with the care you need, when you need it.  Your next appointment:   6 month(s)  Provider:   You may see Dr Darliss or one of the following Advanced Practice Providers on your designated Care Team:   Lonni Meager, NP Lesley Maffucci, PA-C Bernardino Bring, PA-C Cadence Velarde, PA-C Tylene Lunch, NP Barnie Hila, NP    We recommend signing up for the patient portal called MyChart.  Sign up information is provided on this After Visit Summary.  MyChart is used to connect with patients for Virtual Visits (Telemedicine).  Patients are able to view lab/test results, encounter notes, upcoming appointments, etc.  Non-urgent messages can be sent to your provider as well.   To learn more about what you can do with MyChart, go to [email or web address].             Signed, Redell Darliss, MD  07/16/2024 2:58 PM    [facility]

## 2024-07-21 ENCOUNTER — Ambulatory Visit: Payer: Self-pay | Admitting: Cardiology

## 2024-07-21 LAB — LIPID PANEL
Chol/HDL Ratio: 5 ratio (ref 0.0–5.0)
Cholesterol, Total: 144 mg/dL (ref 100–199)
HDL: 29 mg/dL — ABNORMAL LOW (ref >39)
LDL Chol Calc (NIH): 46 mg/dL (ref 0–99)
Triglycerides: 474 mg/dL — ABNORMAL HIGH (ref 0–149)
VLDL Cholesterol Cal: 69 mg/dL — ABNORMAL HIGH (ref 5–40)

## 2024-07-22 ENCOUNTER — Other Ambulatory Visit: Payer: Self-pay

## 2024-07-22 MED ORDER — NEXLIZET 180-10 MG PO TABS: 1.0000 | ORAL_TABLET | Freq: Every day | ORAL | 3 refills | Status: DC

## 2024-07-23 ENCOUNTER — Other Ambulatory Visit: Payer: Self-pay | Admitting: Cardiology

## 2024-07-27 ENCOUNTER — Other Ambulatory Visit (HOSPITAL_COMMUNITY): Payer: Self-pay

## 2024-07-27 NOTE — Telephone Encounter (Signed)
 Pharmacy Patient Advocate Encounter   Received notification from Physician's Office that prior authorization for Nexlizet  is required/requested.   Insurance verification completed.   The patient is insured through [employer].   Per test claim: PA required; PA submitted to above mentioned insurance via Latent Key/confirmation #/EOC [id number] Status is pending

## 2024-07-28 ENCOUNTER — Other Ambulatory Visit (HOSPITAL_COMMUNITY): Payer: Self-pay

## 2024-07-28 ENCOUNTER — Telehealth: Payer: Self-pay | Admitting: Pharmacy Technician

## 2024-07-28 NOTE — Telephone Encounter (Signed)
 Pharmacy Patient Advocate Encounter   Received notification from Physician's Office that prior authorization for nexlizet  is required/requested.   Insurance verification completed.   The patient is insured through [facility].   Per test claim: PA required; PA submitted to above mentioned insurance via Latent Key/confirmation #/EOC [id number] Status is pending

## 2024-07-28 NOTE — Telephone Encounter (Signed)
 Caller (Crystal) is reporting patient's application for Nexlizet  has been denied.  Caller stated she will be faxing over denial notice.

## 2024-07-30 ENCOUNTER — Telehealth: Payer: Self-pay | Admitting: Cardiology

## 2024-07-30 ENCOUNTER — Other Ambulatory Visit (HOSPITAL_COMMUNITY): Payer: Self-pay

## 2024-07-30 NOTE — Telephone Encounter (Signed)
 Left a message for the patient to call back.

## 2024-07-30 NOTE — Telephone Encounter (Signed)
 Pt c/o medication issue:  1. Name of Medication: Repatha  2. How are you currently taking this medication (dosage and times per day)?    3. Are you having a reaction (difficulty breathing--STAT)? no  4. What is your medication issue? Calling about the medication for Repatha that they received from our office. But its not current listed under patient medication. Please advise

## 2024-08-03 ENCOUNTER — Other Ambulatory Visit (HOSPITAL_COMMUNITY): Payer: Self-pay

## 2024-08-03 MED ORDER — EZETIMIBE 10 MG PO TABS: 10.0000 mg | ORAL_TABLET | Freq: Every day | ORAL | 3 refills | Status: DC

## 2024-08-03 MED ORDER — BEMPEDOIC ACID 180 MG PO TABS: 180.0000 mg | ORAL_TABLET | Freq: Every day | ORAL | 3 refills | Status: DC

## 2024-08-03 NOTE — Telephone Encounter (Signed)
 [phone] called and it goes to busy  Call to pt and message left on VM to clarify Repatha

## 2024-08-06 ENCOUNTER — Other Ambulatory Visit (HOSPITAL_COMMUNITY): Payer: Self-pay

## 2024-08-06 NOTE — Telephone Encounter (Signed)
 Called [employer] - to inform the request was for Nexlizet  and not Repatha

## 2024-08-06 NOTE — Telephone Encounter (Signed)
 Jeffrey [name] with [employer] calling to let office know the patients authorization for Repatha has been approved. She will be faxing paperwork to office. Please advise.

## 2024-08-12 ENCOUNTER — Encounter: Payer: Self-pay | Admitting: Cardiology

## 2024-08-13 ENCOUNTER — Other Ambulatory Visit (HOSPITAL_COMMUNITY): Payer: Self-pay

## 2024-08-13 NOTE — Telephone Encounter (Signed)
 Nexlizet  approved and filled on 08/03/24

## 2024-08-16 ENCOUNTER — Other Ambulatory Visit: Payer: Self-pay

## 2024-08-18 ENCOUNTER — Other Ambulatory Visit (HOSPITAL_COMMUNITY): Payer: Self-pay

## 2024-08-18 ENCOUNTER — Telehealth: Payer: Self-pay | Admitting: Pharmacy Technician

## 2024-08-18 MED ORDER — BEMPEDOIC ACID 180 MG PO TABS: 180.0000 mg | ORAL_TABLET | Freq: Every day | ORAL | 3 refills | Status: AC

## 2024-08-18 MED ORDER — EZETIMIBE 10 MG PO TABS: 10.0000 mg | ORAL_TABLET | Freq: Every day | ORAL | 3 refills | Status: AC

## 2024-08-18 NOTE — Telephone Encounter (Addendum)
 Pharmacy Patient Advocate Encounter   Received notification from CoverMyMeds that prior authorization for nexletol  is required/requested.   Insurance verification completed.   The patient is insured through [facility].   Per test claim: PA required; PA submitted to above mentioned insurance via Latent Key/confirmation #/EOC [facility] Status is pending

## 2024-08-18 NOTE — Telephone Encounter (Signed)
 Pharmacy Patient Advocate Encounter  Received notification from [facility] that Prior Authorization for nexletol  has been DENIED.  See denial reason below. No denial letter attached in CMM. Will attach denial letter to Media tab once received. Insurance requires a trial of 3 of the following listed :    PA #/Case ID/Reference #: [id number]

## 2024-08-31 ENCOUNTER — Encounter: Payer: Self-pay | Admitting: Cardiology

## 2024-09-03 NOTE — Progress Notes (Signed)
 [facility]  Chief complaint: Follow-up   Subjective: Jeffrey [name] is a 69 y.o. male here for recheck. History of Present Illness Jeffrey [name] is a 69 year old male with diabetes who presents for follow-up regarding weight management and diabetes control.  His weight initially decreased to around 185-186 pounds about a year ago but has since increased again.  His blood sugar readings have been high, ranging from 150 to 170, but are improved compared to previous readings of 220. He attributes some of his challenges to problematic eating habits.  He is currently using Trulicity  for diabetes management and has observed positive outcomes in others using Mounjaro, particularly for weight loss.  His daughter is on Mounjaro and has experienced significant weight loss, dropping from 230-240 pounds to 155 pounds. She has multiple sclerosis and experiences issues with balance and incontinence, for which she has a stimulator implanted in her hip.    Current Outpatient Medications:    albuterol  MDI, PROVENTIL , VENTOLIN , PROAIR , HFA 90 mcg/actuation inhaler, Inhale 2 inhalations into the lungs every 4 (four) hours as needed for Wheezing or Shortness of Breath, Disp: 3 each, Rfl: 3   amLODIPine  (NORVASC ) 10 MG tablet, Take 1 tablet (10 mg total) by mouth once daily, Disp: 90 tablet, Rfl: 3   aspirin  81 MG chewable tablet, Take 81 mg by mouth once daily, Disp: , Rfl:    atorvastatin  (LIPITOR) 80 MG tablet, Take 80 mg by mouth once daily, Disp: , Rfl:    cetirizine (ZYRTEC) 10 mg capsule, Take 10 mg by mouth once daily, Disp: , Rfl:    dapagliflozin propanediol (FARXIGA) 10 mg tablet, Take 1 tablet (10 mg total) by mouth once daily, Disp: 90 tablet, Rfl: 3   docosahexaenoic acid/epa (FISH OIL ORAL), Take 1 capsule by mouth once daily, Disp: , Rfl:    ezetimibe  (ZETIA ) 10 mg tablet, Take 10 mg by mouth once daily, Disp: , Rfl:    fluticasone  propionate (FLOVENT  DISKUS)  50 mcg/actuation diskus inhaler, Inhale 1 Puff into the lungs 2 (two) times daily, Disp: 180 each, Rfl: 3   glipiZIDE  (GLUCOTROL ) 10 MG tablet, Take 1 tablet (10 mg total) by mouth every morning before breakfast, Disp: 90 tablet, Rfl: 3   losartan  (COZAAR ) 100 MG tablet, Take 1 tablet (100 mg total) by mouth once daily, Disp: 90 tablet, Rfl: 3   meloxicam (MOBIC) 15 MG tablet, Take 15 mg by mouth once daily, Disp: , Rfl:    metFORMIN  (GLUCOPHAGE ) 1000 MG tablet, Take 1 tablet (1,000 mg total) by mouth 2 (two) times daily with meals, Disp: 180 tablet, Rfl: 3   omeprazole  (PRILOSEC) 20 MG DR capsule, Take 1 capsule (20 mg total) by mouth once daily, Disp: 90 capsule, Rfl: 3   triamterene -hydroCHLOROthiazide (DYAZIDE) 37.5-25 mg capsule, Take 1 capsule by mouth once daily, Disp: 90 capsule, Rfl: 3   blood glucose diagnostic test strip, Use 1 strip 3 (three) times daily Use as instructed., Disp: , Rfl:    blood glucose meter kit, Use as directed, Disp: , Rfl:    dulaglutide  (TRULICITY ) 3 mg/0.5 mL subcutaneous pen injector, Inject 0.5 mLs (3 mg total) subcutaneously once a week, Disp: 0.5 mL, Rfl: 11   dulaglutide  (TRULICITY ) 4.5 mg/0.5 mL subcutaneous pen injector, Inject 0.5 mLs (4.5 mg total) subcutaneously once a week (Patient not taking: Reported on 09/03/2024), Disp: 6 mL, Rfl: 5   loratadine  (CLARITIN ) 10 mg capsule, Take 10 mg by mouth once daily (Patient not taking:  Reported on 09/03/2024), Disp: , Rfl:   ROS reviewed: General ROS:  Negative for  - fatigue, fevers, chills HEENT ROS: negative for seasonal allergies, congestion, cough Respiratory ROS: negative for - cough, SOB, wheezing Cardiovascular ROS: no chest pain or dyspnea on exertion Gastrointestinal ROS: negative for constipation, N/V,D Genito-Urinary ROS: no dysuria, trouble voiding, or hematuria Musculoskeletal ROS: negative for joint pain, swelling Neurological ROS: negative for headaches, dizzinesss Dermatological ROS:  negative for rash or skin lesion    Psychological ROS: negative for depression or anxiety  Allergies  Allergen Reactions   Codeine Itching and Rash    Social History   Tobacco Use   Smoking status: Former    Types: Cigarettes   Smokeless tobacco: Never   Tobacco comments:    Smoked 69 yrs old, none since.   Vaping Use   Vaping status: Never Used  Substance Use Topics   Alcohol use: Never   Drug use: Never      Objective:  BP 120/80 (BP Location: Left upper arm, Patient Position: Sitting, BP Cuff Size: Adult)   Pulse 65   Resp 16   Ht 167.6 cm (5' 6)   Wt 89.8 kg (198 lb)   SpO2 96%   BMI 31.96 kg/m  reviewed. Gen: AAOx3. Well-developed and well-nourished. NAD.  HEENT:    HEAD NORMOCEPHALIC.  PERRLA, EOM intact.  No thyromegaly present. No lymphadpathy. Cardiovascular: Normal rate, regular rhythm. Normal S1 and S2 without murmus, rubs or gallops.  Pulmonary/Chest: CTAB. Effort normal and breath sounds normal. No respiratory distress. No wheezes or rales. Abdomen: Soft. Bowel sounds are normal. No distension or tenderness.  Neuro: Cranial nervess II-XII intact.Nonfocal exam. Skin: Skin is warm and dry.  Musculoskeletal: Normal range of motion. No edema, no tenderness.  Psychiatric: Normal mood and affect. Her behavior is normal. Judgment and thought content normal  Assessment and Plan: Assessment & Plan Type 2 diabetes mellitus Managed with Trulicity . Blood glucose improved but still elevated. Considered Mounjaro for better glycemic control and weight loss. - If A1c is worse, consider switching to Mounjaro, starting low dose and titrating up. - Instructed to contact [facility] for [facility] application process.  Obesity Weight fluctuated with recent increase. Mounjaro considered for weight loss and improved diabetes management. - Consider switching to Mounjaro for weight loss if A1c is worse. - Discussed Mounjaro's benefits in reducing appetite and aiding  weight loss.   Type 2 diabetes mellitus with hyperglycemia, without long-term current use of insulin  (CMS/HHS-HCC)  (primary encounter diagnosis) Plan: Hemoglobin A1C  Primary hypertension  Class 1 obesity due to excess calories with serious comorbidity and body mass index (BMI) of 32.0 to 32.9 in adult  Stage 3a chronic kidney disease (CMS-HCC)  Mixed hyperlipidemia        This note has been created using automated tools and reviewed for accuracy by RICHARD LESLIE GILBERT.

## 2024-09-10 ENCOUNTER — Encounter: Payer: Self-pay | Admitting: Urology

## 2024-09-10 ENCOUNTER — Ambulatory Visit: Admitting: Urology

## 2024-09-10 VITALS — BP 129/76 | HR 76 | Ht 66.0 in | Wt 197.0 lb

## 2024-09-10 DIAGNOSIS — N529 Male erectile dysfunction, unspecified: Secondary | ICD-10-CM | POA: Diagnosis not present

## 2024-09-10 DIAGNOSIS — N486 Induration penis plastica: Secondary | ICD-10-CM | POA: Diagnosis not present
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
## 2024-09-10 NOTE — Progress Notes (Signed)
 Patient presents for an office visit. BP today is High. Greater than 140/90. Provider  notified and recheck Blood Pressure .  Pt advised to talk with PCP.  Pt voiced understanding.
